# Patient Record
Sex: Male | Born: 1940 | Race: White | Hispanic: No | Marital: Married | Smoking: Never smoker
Health system: Southern US, Community
[De-identification: ages and names within clinical notes are randomized; demographics above are authoritative.]

## PROBLEM LIST (undated history)

## (undated) DIAGNOSIS — C801 Malignant (primary) neoplasm, unspecified: Secondary | ICD-10-CM

## (undated) DIAGNOSIS — I10 Essential (primary) hypertension: Secondary | ICD-10-CM

## (undated) DIAGNOSIS — E119 Type 2 diabetes mellitus without complications: Secondary | ICD-10-CM

## (undated) HISTORY — PX: PROSTATE SURGERY: SHX751

## (undated) HISTORY — PX: ROTATOR CUFF REPAIR: SHX139

## (undated) HISTORY — PX: CERVICAL SPINE SURGERY: SHX589

---

## 1997-09-30 ENCOUNTER — Ambulatory Visit (HOSPITAL_COMMUNITY): Admission: RE | Admit: 1997-09-30 | Discharge: 1997-09-30 | Payer: Self-pay | Admitting: Gastroenterology

## 2001-12-12 ENCOUNTER — Ambulatory Visit (HOSPITAL_BASED_OUTPATIENT_CLINIC_OR_DEPARTMENT_OTHER): Admission: RE | Admit: 2001-12-12 | Discharge: 2001-12-12 | Payer: Self-pay | Admitting: Orthopedic Surgery

## 2001-12-12 ENCOUNTER — Encounter (INDEPENDENT_AMBULATORY_CARE_PROVIDER_SITE_OTHER): Payer: Self-pay | Admitting: *Deleted

## 2005-11-09 ENCOUNTER — Ambulatory Visit: Payer: Self-pay | Admitting: Endocrinology

## 2005-12-06 ENCOUNTER — Ambulatory Visit: Payer: Self-pay | Admitting: Endocrinology

## 2006-02-02 ENCOUNTER — Ambulatory Visit: Payer: Self-pay | Admitting: Endocrinology

## 2006-03-01 ENCOUNTER — Ambulatory Visit (HOSPITAL_COMMUNITY): Admission: RE | Admit: 2006-03-01 | Discharge: 2006-03-01 | Payer: Self-pay | Admitting: Family Medicine

## 2006-04-27 ENCOUNTER — Ambulatory Visit (HOSPITAL_COMMUNITY): Admission: RE | Admit: 2006-04-27 | Discharge: 2006-04-27 | Payer: Self-pay | Admitting: Family Medicine

## 2006-09-03 ENCOUNTER — Encounter: Payer: Self-pay | Admitting: Endocrinology

## 2006-09-03 DIAGNOSIS — E119 Type 2 diabetes mellitus without complications: Secondary | ICD-10-CM

## 2006-09-03 DIAGNOSIS — I10 Essential (primary) hypertension: Secondary | ICD-10-CM | POA: Insufficient documentation

## 2006-11-16 ENCOUNTER — Encounter (INDEPENDENT_AMBULATORY_CARE_PROVIDER_SITE_OTHER): Payer: Self-pay | Admitting: Urology

## 2006-11-16 ENCOUNTER — Inpatient Hospital Stay (HOSPITAL_COMMUNITY): Admission: RE | Admit: 2006-11-16 | Discharge: 2006-11-17 | Payer: Self-pay | Admitting: Urology

## 2007-06-19 ENCOUNTER — Ambulatory Visit (HOSPITAL_COMMUNITY): Admission: RE | Admit: 2007-06-19 | Discharge: 2007-06-19 | Payer: Self-pay | Admitting: Family Medicine

## 2007-10-30 ENCOUNTER — Inpatient Hospital Stay (HOSPITAL_COMMUNITY): Admission: RE | Admit: 2007-10-30 | Discharge: 2007-10-31 | Payer: Self-pay | Admitting: Neurosurgery

## 2007-11-23 ENCOUNTER — Encounter: Admission: RE | Admit: 2007-11-23 | Discharge: 2007-11-23 | Payer: Self-pay | Admitting: Neurosurgery

## 2008-05-22 ENCOUNTER — Ambulatory Visit (HOSPITAL_COMMUNITY): Admission: RE | Admit: 2008-05-22 | Discharge: 2008-05-22 | Payer: Self-pay | Admitting: Family Medicine

## 2008-12-17 ENCOUNTER — Encounter: Admission: RE | Admit: 2008-12-17 | Discharge: 2008-12-17 | Payer: Self-pay | Admitting: Neurosurgery

## 2009-03-24 ENCOUNTER — Ambulatory Visit (HOSPITAL_COMMUNITY): Admission: RE | Admit: 2009-03-24 | Discharge: 2009-03-24 | Payer: Self-pay | Admitting: Family Medicine

## 2009-12-02 ENCOUNTER — Ambulatory Visit (HOSPITAL_COMMUNITY): Admission: RE | Admit: 2009-12-02 | Discharge: 2009-12-03 | Payer: Self-pay | Admitting: Urology

## 2010-03-31 LAB — COMPREHENSIVE METABOLIC PANEL
AST: 35 U/L (ref 0–37)
Alkaline Phosphatase: 70 U/L (ref 39–117)
BUN: 15 mg/dL (ref 6–23)
CO2: 30 mEq/L (ref 19–32)
Chloride: 103 mEq/L (ref 96–112)
Creatinine, Ser: 0.94 mg/dL (ref 0.4–1.5)
GFR calc Af Amer: >60 mL/min (ref >60)
GFR calc non Af Amer: >60 mL/min (ref >60)
Potassium: 4.9 mEq/L (ref 3.5–5.1)
Sodium: 141 mEq/L (ref 135–145)
Total Bilirubin: 0.6 mg/dL (ref 0.3–1.2)

## 2010-03-31 LAB — BASIC METABOLIC PANEL
BUN: 9 mg/dL (ref 6–23)
CO2: 31 mEq/L (ref 19–32)
Chloride: 104 mEq/L (ref 96–112)
GFR calc Af Amer: >60 mL/min (ref >60)
GFR calc non Af Amer: >60 mL/min (ref >60)
Glucose, Bld: 136 mg/dL — ABNORMAL HIGH (ref 70–99)

## 2010-03-31 LAB — GLUCOSE, CAPILLARY
Glucose-Capillary: 165 mg/dL — ABNORMAL HIGH (ref 70–99)
Glucose-Capillary: 211 mg/dL — ABNORMAL HIGH (ref 70–99)
Glucose-Capillary: 229 mg/dL — ABNORMAL HIGH (ref 70–99)

## 2010-03-31 LAB — CBC
MCHC: 34.9 g/dL (ref 30.0–36.0)
MCV: 94.2 fL (ref 78.0–100.0)
RBC: 4.1 MIL/uL — ABNORMAL LOW (ref 4.22–5.81)
RDW: 12.7 % (ref 11.5–15.5)

## 2010-03-31 LAB — TYPE AND SCREEN: ABO/RH(D): A POS

## 2010-03-31 LAB — HEMOGLOBIN AND HEMATOCRIT, BLOOD: Hemoglobin: 11.9 g/dL — ABNORMAL LOW (ref 13.0–17.0)

## 2010-03-31 LAB — APTT: aPTT: 30 seconds (ref 24–37)

## 2010-03-31 LAB — PROTIME-INR: INR: 1 (ref 0.00–1.49)

## 2010-04-28 ENCOUNTER — Other Ambulatory Visit (HOSPITAL_COMMUNITY): Payer: Self-pay | Admitting: Pulmonary Disease

## 2010-04-28 DIAGNOSIS — R0602 Shortness of breath: Secondary | ICD-10-CM

## 2010-04-30 ENCOUNTER — Other Ambulatory Visit (HOSPITAL_COMMUNITY): Payer: Self-pay

## 2010-04-30 ENCOUNTER — Ambulatory Visit (HOSPITAL_COMMUNITY)
Admission: RE | Admit: 2010-04-30 | Discharge: 2010-04-30 | Disposition: A | Discharge status: Home / Self Care | Payer: Medicare Other | Source: Ambulatory Visit | Attending: Pulmonary Disease | Admitting: Pulmonary Disease

## 2010-04-30 ENCOUNTER — Encounter (HOSPITAL_COMMUNITY): Payer: Self-pay

## 2010-04-30 DIAGNOSIS — E119 Type 2 diabetes mellitus without complications: Secondary | ICD-10-CM | POA: Insufficient documentation

## 2010-04-30 DIAGNOSIS — R0602 Shortness of breath: Secondary | ICD-10-CM

## 2010-04-30 DIAGNOSIS — Z8546 Personal history of malignant neoplasm of prostate: Secondary | ICD-10-CM | POA: Insufficient documentation

## 2010-04-30 DIAGNOSIS — J61 Pneumoconiosis due to asbestos and other mineral fibers: Secondary | ICD-10-CM | POA: Insufficient documentation

## 2010-04-30 HISTORY — DX: Malignant (primary) neoplasm, unspecified: C80.1

## 2010-04-30 MED ORDER — IOHEXOL 300 MG/ML  SOLN
100.0000 mL | Freq: Once | INTRAMUSCULAR | Status: AC | PRN
  Administered 2010-04-30: 100 mL via INTRAVENOUS

## 2010-05-05 ENCOUNTER — Ambulatory Visit (HOSPITAL_COMMUNITY)
Admission: RE | Admit: 2010-05-05 | Discharge: 2010-05-05 | Disposition: A | Discharge status: Home / Self Care | Payer: Medicare Other | Source: Ambulatory Visit | Attending: Pulmonary Disease | Admitting: Pulmonary Disease

## 2010-05-05 DIAGNOSIS — R0609 Other forms of dyspnea: Secondary | ICD-10-CM | POA: Insufficient documentation

## 2010-05-05 DIAGNOSIS — R0989 Other specified symptoms and signs involving the circulatory and respiratory systems: Secondary | ICD-10-CM | POA: Insufficient documentation

## 2010-05-05 LAB — BLOOD GAS, ARTERIAL
Bicarbonate: 24.2 mEq/L — ABNORMAL HIGH (ref 20.0–24.0)
Patient temperature: 37
pH, Arterial: 7.453 — ABNORMAL HIGH (ref 7.350–7.450)

## 2010-06-02 NOTE — H&P (Signed)
NAME:  Tommy Graham, Tommy Graham                ACCOUNT NO.:  [id number]   MEDICAL RECORD NO.:  [id number]          PATIENT TYPE:  INP   LOCATION:  X011                         FACILITY:  [facility]   PHYSICIAN:  Lucrezia Starch. Earlene Plater, M.D.  DATE OF BIRTH:  [birth year]   DATE OF ADMISSION:  11/16/2006  DATE OF DISCHARGE:                              HISTORY & PHYSICAL   CHIEF COMPLAINT:  I have prostate cancer.   HISTORY OF PRESENT ILLNESS:  Tommy Graham is a very nice 70 year old white  male who presented with an elevating PSA last value was 3.43.  On  examination was found to have soft asymmetry of his prostate.  Subsequently, he underwent ultrasound and biopsy of the prostate which  revealed a Gleason score 6 which 3+, 3 adenocarcinoma bilaterally and  fairly low volume.  He has considered all options, after understanding  risks, benefits and alternatives he has elected to proceed with robotic  radical prostatectomy.  He has been cleared for surgery by Patrica Duel, M.D. in [place], [place] and Richard A. Alanda Amass,  M.D of [facility].   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   He has some heart disease.  He has had high blood pressure, diabetes,  glaucoma.   MEDICATIONS:  Include metformin, glipizide, felodipine,lisinopril and  Xalatan, Ativan, fluoxetine and loratadine.   SURGERIES:  He had rotator cuff surgery and nose surgery.   SOCIAL HISTORY:  He quit smoking 18 years ago, but he did smoke 36 years  and he smoked up to four packs per day.  Drinker, no alcohol.  He has up  to four cups of coffee and three diet drinks per day.   FAMILY HISTORY:  Not significant.   REVIEW OF SYSTEMS:  He has had some weight loss, skin rash, sore throat.  Occasional shortness of breath, excessive thirst and some joint pain,  slight diarrhea and some constipation and headaches, depression and  anxiety,  but otherwise without problems.   PHYSICAL EXAMINATION:  He is afebrile.  Vital signs stable.  GENERAL:  He is well-nourished, well-groomed, oriented x3.  HEAD, EARS, NOSE, THROAT:  Normal.  NECK:  Without masses or thyromegaly.  CHEST:  Normal diaphragmatic motion.  HEART: Normal S1 and S2 without murmurs or gallops.  ABDOMEN:  Soft, nontender without masses, organomegaly.  EXTREMITIES:  Normal.  NEURO:  Intact.  SKIN:  Normal.  GU:  Penis, meatus, scrotum, testicle, adnexa, anus, perineum normal.  RECTAL:  Prostate is 25-30 grams with some soft asymmetry, but no frank  nodules.   IMPRESSION:  Clinical stage TIIB, although questionable palpable  abnormalities prostate cancer.   PLAN:  Robotic radical prostatectomy.      [name], M.D.  Electronically Signed     RLD/MEDQ  D:  11/16/2006  T:  11/17/2006  Job:  [id number]

## 2010-06-02 NOTE — Op Note (Signed)
NAME:  Tommy Graham, Tommy Graham NO.:  [id number]   MEDICAL RECORD NO.:  [id number]          PATIENT TYPE:  OIB   LOCATION:  3538                         FACILITY:  [facility]   PHYSICIAN:  Hewitt Shorts, M.D.DATE OF BIRTH:  11-22-1940   DATE OF PROCEDURE:  10/30/2007  DATE OF DISCHARGE:                               OPERATIVE REPORT   PREOPERATIVE DIAGNOSES:  1. Carpal tunnel syndrome.  2. Cervical disk herniation.  3. Cervical spondylosis.  4. Cervical degenerative disk disease.   POSTOPERATIVE DIAGNOSES:  1. Carpal tunnel syndrome.  2. Cervical disk herniation.  3. Cervical spondylosis.  4. Cervical degenerative disk disease.   PROCEDURES:  1. Left carpal tunnel release.  2. C5-6 and C6-7 anterior cervical decompression and arthrodesis with      a allograft and Tether cervical plating.   SURGEON:  Hewitt Shorts, MD   ASSISTANTS:  1. Russell L. Webb Silversmith, RN  2. Eliane Decree, MD   ANESTHESIA:  General endotracheal.   INDICATIONS:  The patient is a 70 year old man who has had neck and  bilateral upper extremity radicular pain, left worse than right.  He  also has bilateral carpal tunnel syndrome.  MRI of the cervical spine  shows advanced spondylosis and degenerative disk disease at the C5-6 and  C6-7 levels with significant disk herniations, C5-6 larger than C6-7,  both worse to the left side.  Decision was made to proceed with  decompression and arthrodesis.  We will plan on doing the left carpal  tunnel release as well, and plan on returning in a couple of months for  the right carpal tunnel release.   PROCEDURE:  The patient was brought to the operating room, placed under  general endotracheal anesthesia.  We first placed the tourniquet on the  left upper extremity, and then the left upper extremity was prepped with  Betadine soap and solution, and draped in a sterile fashion.  The left  carpal tunnel release was begun with incision in the proximal  palm, just  medial to the left thenar crease.  Dissection was carried down through  the subcutaneous tissue to the transverse carpal ligament.  This was  carefully divided in a distal-to-proximal fashion with care taken to  leave the underlying median nerve undisturbed.  The ligament was opened  in its full extent from distal-to-proximal and good decompression of the  carpal tunnel was achieved.  We then proceeded with closure.  The  subcutaneous tissue was approximated with interrupted and inverted 3-0  undyed Vicryl sutures and the skin edges were approximated with  interrupted horizontal mattress sutures with a 4-0 Nylon suture.  The  wound was dressed with Adaptic and gauze fluffs, and wrapped with a  Kling, and the tourniquet was released.   We then proceeded with the anterior cervical decompression and  arthrodesis.   The patient was placed in 10 pounds of halter traction.  The neck was  prepped with Betadine soap and solution, and draped in sterile fashion.  An horizontal incision was made on the left side of the neck.  The line  of  the incision was infiltrated with local anesthetic with epinephrine.  Dissection was carried down through the subcutaneous tissue.  Bipolar  electrocautery was used to maintain hemostasis.  Dissection was carried  down through an avascular plane leaving a sternocleidomastoid, carotid  artery, and jugular vein laterally; and trachea and esophagus medially.  The ventral aspect of the vertebral column was identified and a  localizing x-ray taken.  The C5-6 and C6-7 intravertebral disk space  were identified.  Discectomy was begun with incision of the annulus.  There were significant ventral osteophytic overgrowth that was removed  using osteotomes, the osteophyte removal tool, Kerrison punches, and the  X-max drill.  The disk space was entered at each level.  The disk was  found to be significantly degenerative at each level, and we proceeded  with a  discectomy using microcurette and pituitary rongeurs.  We then  began to remove the cartilaginous end-plate surface of the vertebral  bodies using the microcurettes along with the X-max drill.  The  microscope was draped and brought in to the field to provide additional  magnification, illumination, and visualization; and the remainder of the  decompression was performed using microdissection and microsurgical  technique.  Using the X-max drill, we removed the posterior osteophytic  overgrowth along with subsequently, a 2 mm Kerrison punch with a thin  footplate.  The posterior longitudinal ligament was carefully removed.  At the C5-6 level, we found a large disk herniation and several large  fragments of degenerate disk material were removed and good  decompression of the thecal sac was achieved.   We then continued the removal of spondylytic overgrowth posteriorly at  that level and examined the neural foramina to ensure the good  decompression of the exiting nerve roots was achieved bilaterally.  At  the C6-7 level, decompression was performed similarly.  Using the X-max  drill, we removed the posterior osteophytic overgrowth.  The posterior  longitudinal ligament was removed, and the spondylotic disk herniation  was removed, and again good decompression of the thecal sac and nerve  roots was achieved bilaterally.  Gelfoam was used to help establish  hemostasis along with the use of bipolar cautery.  Once the  decompression was completed and hemostasis was established, we measured  the height of the intervertebral disk space and selected two 7 mm  implants.  Each of these allograft implants was hydrated, and then  placed in the intervertebral disk space, and counter sunk.   We then discontinued the cervical traction and selected a Tether  cervical plate, it was positioned it over the fusion construction, and  secured to the vertebrae with 4 x 14 mm variable-angle screws.  Each  screw  hole was drilled, and the screw was placed in a alternating  fashion.  Final tightening was done of all five screws.  An x-ray showed  the grasp, plate and screws to be in good position, and the alignment to  be good.  The wound was irrigated with Bacitracin solution, checked for  hemostasis, was established and confirmed, and then we proceeded with  closure.  The platysma was closed with interrupted inverted 2-0 undyed  Vicryl sutures.  The subcutaneous and subcuticular were closed with  interrupted inverted 3-0 undyed Vicryl sutures, and the skin was  reapproximated with Dermabond.  The procedure was tolerated well.  Estimated blood loss was 100 mL.  Sponge and count were correct.  Following surgery, the patient was  placed in a soft cervical collar, reversed from  the anesthetic,  extubated, and transferred to the recovery room for further care where  he was noted to be moving all four extremities to command.      Hewitt Shorts, M.D.  Electronically Signed     RWN/MEDQ  D:  10/30/2007  T:  10/31/2007  Job:  [id number]   cc:   Hewitt Shorts, M.D.

## 2010-06-02 NOTE — Op Note (Signed)
NAME:  Tommy Graham, Tommy Graham                ACCOUNT NO.:  [id number]   MEDICAL RECORD NO.:  [id number]          PATIENT TYPE:  INP   LOCATION:  X011                         FACILITY:  [facility]   PHYSICIAN:  Lucrezia Starch. Earlene Plater, M.D.  DATE OF BIRTH:  04-30-1940   DATE OF PROCEDURE:  11/16/2006  DATE OF DISCHARGE:                               OPERATIVE REPORT   DIAGNOSIS:  Adenocarcinoma of prostate.   OPERATIVE PROCEDURE:  Robotic radical prostatectomy.   SURGEON:  Dr. Gaynelle Arabian.   ASSISTANT:  Heloise Purpura, MD   ANESTHESIA:  General endotracheal.   ESTIMATED BLOOD LOSS:  100 mL.   TUBES:  22-French Foley catheter and large round Blake drain.   COMPLICATIONS:  None.   INDICATIONS FOR PROCEDURE:  [name] is a very nice 70 year old white  male who was seen by Dr. Patrica Duel and found have palpable  abnormality his prostate. His PSA increased to the mid 3 range and he  was subsequently referred to see me.  It was actually 3.43.  He went  ultrasound and biopsy of the prostate which revealed bifocal bilateral  Gleason score 6 which was 3+3 adenocarcinoma.  He has considered all  options, considered risks, benefits and alternatives in detail and he  elected to proceed with robotic prostatectomy.  He has been  preoperatively cleared by Dr. Susa Griffins and Dr. Patrica Duel.   PROCEDURE IN DETAIL:  The patient is placed in supine position.  After  proper general endotracheal anesthesia he was placed in exaggerated  lithotomy position and prepped and draped with Betadine in a sterile  fashion.  A 22-French Foley catheter was inserted with 30 mL balloon and  the bladder was drained.  A periumbilical port was placed lateral to the  umbilicus through a small incision and the bladder was insufflated.  This was a 12 mm port.  The bladder was insufflated with CO2.  Right and  left robotic arm ports were then placed with 8 mm cannulas as was a  fourth arm port lateral to the left robotic  arm port.  Two working  ports, a 5 mm left arm and a 12 mm with right arm ports placed in the  right upper and mid quadrant of the abdomen.  Inspection revealed a few  adhesions but none affecting the procedure and no other abnormalities  noted.  The bladder was filled with approximately 200 mL of sterile  water and anterior space of Retzius was entered transperineally and was  dissected.  The endopelvic fascia was taken down bilaterally as was  partial resection of puboprostatic ligaments and dorsal vein complex was  clamped and cut with Endo GIA stapler.  The bladder neck prostatic  junction was then incised anteriorly and taken down to the bladder neck.  The bladder neck was taken off posteriorly.  The ampulla and vas  deferens were transected and then the resection of the seminal vesicles  were performed, maintaining good hemostasis.  The Denonvilliers fascia  was then entered.  A bilateral nerve spare was performed and the  neurovascular bundles were taken down posterolaterally  and the pedicles  were taken in serial packets with a large Hem-o-lok clips and the  dissection was carried down to the apex of the prostate on Denonvilliers  fascia.  Good bundles were noted to be present.  Anterior urethra was  then excised down to the Foley catheter.  This Foley catheter was  removed.  The posterior urethral plate was taken down.  The apex of  prostate was carefully dissected from the neurovascular bundle and the  prostate was removed.  No significant abnormalities were noted in the  lymph node chains.  We felt that probably was not necessary to do a  lymphadenectomy.  The bladder neck was then approximated to the urethra  after an amp of indigo carmine had been given IV.  A folding stitch was  placed with a 2-0 Vicryl suture in the 6 o'clock position to the  periurethral area and the anastomosis was accomplished with 3-0 Monocryl  suture both dyed and undyed tied together and the knot was  placed  posterior to the bladder at the 12 o'clock position.  They were run and  then tied anteriorly.  A 22 French coude catheter was easily passed into  the bladder.  The bladder was noted to irrigate clear.  Good hemostasis  was noted to be present.  It might be noted that saline had then placed  in the pelvis and rectum insufflated.  There were no leaks noted.  A  large round Blake drain was placed through the fourth arm port, placed  in the pelvis.  The specimen was grasped with Endocatch bag through the  periumbilical camera port.  The right 12-mm port was closed under direct  vision with suture passer and a 2-0 Vicryl suture and all devices were  removed along with the specimen and the periumbilical port was closed  under direct vision with 2-0 Vicryl fascial stitch.  The wounds were all  injected with 0.25% Marcaine.  It might be noted that each cannula had  been visualized in its removal and there was no bleeding present.  The  skin was then closed with skin staples.  Wound was dressed sterilely and  the patient was taken to the recovery room stable.      [name], M.D.  Electronically Signed     RLD/MEDQ  D:  11/16/2006  T:  11/17/2006  Job:  [id number]

## 2010-06-05 NOTE — Op Note (Signed)
NAME:  Tommy Graham, Tommy Graham                          ACCOUNT NO.:  [id number]   MEDICAL RECORD NO.:  [id number]                   PATIENT TYPE:  AMB   LOCATION:  DSC                                  FACILITY:  [facility]   PHYSICIAN:  Katy Fitch. Naaman Plummer., M.D.          DATE OF BIRTH:  02/18/1940   DATE OF PROCEDURE:  12/12/2001  DATE OF DISCHARGE:                                 OPERATIVE REPORT   PREOPERATIVE DIAGNOSES:  1. 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  2. Chronic mass, dorsal radial aspect of right long finger, status post     aggressive excision of mucous cyst by dermatologist.  3. 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   POSTOPERATIVE DIAGNOSES:  1. Chronic stenosing tenosynovitis, right thumb.  2. Scar and capsular granulation tissue, right long finger.  3. Giant cell tumor, right ring finger.   OPERATION PERFORMED:  1. Release of right thumb A-1 pulley, 26055-F5.  2. 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  3. Resection of giant cell tumor, right ring finger distal interphalangeal     joint region and radial aspect of middle phalanx.   SURGEON:  Katy Fitch. Sypher, M.D.   ASSISTANT:  Jonni Sanger, P.A.   ANESTHESIA:  General by mask.   SUPERVISING ANESTHESIOLOGIST:  Janetta Hora. Gelene Mink, M.D.   INDICATIONS FOR PROCEDURE:  The patient is a 71 year old gentleman referred  for evaluation and management of chronic wounds following aggressive  treatment of mucous cysts of the long and ring fingers back in 2000.  He had  very slow healing of his wounds by secondary intention.  At that time we did  not recommend  proceeding with skin grafts or other treatment as his wounds  appeared to be chronically colonized with bacteria.  He eventually healed  his wounds but developed recurrent mass lesions on the dorsal radial aspect  of the long and ring fingers.  X-rays in 2000 demonstrated a loose body  adjacent to the DIP joint of the long finger; however, films in 2003  revealed that the loose body had either resorbed or sloughed.   We recommended repeat exploration of his wounds at this time to determine  the nature of the masses that had recurred.  In addition, he had chronic  stenosing tenosynovitis of his right thumb.  This is in part due to chronic  type 2 diabetes.  After failure to respond to nonoperative measures, he is  brought to the operating room at this time.   DESCRIPTION OF PROCEDURE:  The patient was brought to the operating  room and  placed in supine position upon the operating table.  Following induction of  general anesthesia, the right arm was prepped with Betadine soap and  solution and sterilely draped.  Following exsanguination of the limb with an  Esmarch bandage, an arterial tourniquet on the proximal brachium was  inflated to 220 mmHg.  The procedure commenced with a short transverse  incision directly over the enlarged A-1 pulley.  Subcutaneous tissues were  carefully divided revealing significant fibrosis of the skin ligaments.  The  A-1 pulley was isolated and found to be thickened to a wall thickness of  approximately 2 mm.  This was split with a scalpel and scissors.  The flexor  pollicis longus was recovered and found to be in good condition.  Thereafter  free range of motion of the IP joint was recovered.  This wound was repaired  with mattress sutures of 5-0 nylon.   Attention was then directed to the dorsal radial aspect of the ring finger.  A curvilinear incision was fashioned directly over the mass.  Subcutaneous  tissues were carefully divided revealing a well  encapsulated orangish-gray  tumor consistent with a giant cell tumor.  This was circumferentially  dissected and a portion sent for biopsy.   A thorough cleaning of the entire radial capsule of the DIP joint was  accomplished, sparing the extensor tendon and the radial collateral  ligament.  The DIP joint was entered and a curet was used to clean the deep  surface of the distal phalanx.  No significant osteophytes or other lesions  were noted within the joint.  The ring finger wound was then repaired with  mattress sutures of 5-0 nylon.   Attention was then directed to the long finger where a portion of the  previous scar was resected and a Mercedes type incision created.  A  hypertrophic scar was noted with considerable granulation tissue along the  dorsal radial aspect of the joint.  There were no signs of giant cell tumor  from ascending or recurrent mucous cyst.  The hypertrophic granulation  tissue was resected down to the level of the extensor tendon and radial  collateral ligament.  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   This wound was irrigated.  Bleeding points were electrocauterized with  bipolar current followed by repair of the skin with interrupted sutures of 5-  0 nylon.  There were no apparent complications.  The patient tolerated the  procedure well.  The fingers were anesthetized with 0.25% Marcaine for  postoperative analgesia followed by dressing with Xeroflo, sterile gauze and  Coban.   NOTE:  He will return to our office in follow-up in approximately one week  for dressing change.                                                Katy Fitch Naaman Plummer., M.D.    RVS/MEDQ  D:  12/12/2001  T:  12/12/2001  Job:  [id number]

## 2010-10-19 LAB — CBC
HCT: 38 — ABNORMAL LOW
Hemoglobin: 13.1
MCHC: 34.3
MCV: 93.4
Platelets: 327
RBC: 4.07 — ABNORMAL LOW
RDW: 12.8
WBC: 6.3

## 2010-10-19 LAB — BASIC METABOLIC PANEL
BUN: 14
CO2: 26
Calcium: 9.5
Chloride: 106
Creatinine, Ser: 0.74
GFR calc Af Amer: >60
GFR calc non Af Amer: >60
Glucose, Bld: 102 — ABNORMAL HIGH
Potassium: 4.5
Sodium: 139

## 2010-10-19 LAB — GLUCOSE, CAPILLARY
Glucose-Capillary: 119 — ABNORMAL HIGH
Glucose-Capillary: 149 — ABNORMAL HIGH
Glucose-Capillary: 213 — ABNORMAL HIGH
Glucose-Capillary: 268 — ABNORMAL HIGH
Glucose-Capillary: 298 — ABNORMAL HIGH
Glucose-Capillary: 90

## 2010-10-28 LAB — DIFFERENTIAL
Eosinophils Relative: 0
Lymphocytes Relative: 7 — ABNORMAL LOW
Lymphs Abs: 0.8
Monocytes Absolute: 0.6

## 2010-10-28 LAB — URINALYSIS, ROUTINE W REFLEX MICROSCOPIC
Glucose, UA: NEGATIVE
Ketones, ur: NEGATIVE
Nitrite: NEGATIVE
Protein, ur: NEGATIVE
Urobilinogen, UA: 0.2

## 2010-10-28 LAB — CBC
HCT: 32.8 — ABNORMAL LOW
HCT: 41
Hemoglobin: 11.5 — ABNORMAL LOW
Platelets: 352
RDW: 12.1
WBC: 10.9 — ABNORMAL HIGH
WBC: 7.4

## 2010-10-28 LAB — COMPREHENSIVE METABOLIC PANEL
AST: 23
Albumin: 3.6
Alkaline Phosphatase: 77
BUN: 10
GFR calc Af Amer: >60
Potassium: 4.7
Total Protein: 7.2

## 2010-10-28 LAB — BASIC METABOLIC PANEL
Chloride: 102
GFR calc non Af Amer: >60
Glucose, Bld: 242 — ABNORMAL HIGH
Potassium: 3.8
Sodium: 133 — ABNORMAL LOW

## 2010-10-28 LAB — PROTIME-INR: INR: 0.9

## 2010-10-28 LAB — APTT: aPTT: 29

## 2014-12-26 ENCOUNTER — Other Ambulatory Visit (HOSPITAL_COMMUNITY): Payer: Self-pay | Admitting: Physician Assistant

## 2014-12-26 ENCOUNTER — Ambulatory Visit (HOSPITAL_COMMUNITY)
Admission: RE | Admit: 2014-12-26 | Discharge: 2014-12-26 | Disposition: A | Discharge status: Home / Self Care | Payer: Medicare Other | Source: Ambulatory Visit | Attending: Physician Assistant | Admitting: Physician Assistant

## 2014-12-26 DIAGNOSIS — J61 Pneumoconiosis due to asbestos and other mineral fibers: Secondary | ICD-10-CM | POA: Diagnosis not present

## 2014-12-26 DIAGNOSIS — T578X1A Toxic effect of other specified inorganic substances, accidental (unintentional), initial encounter: Secondary | ICD-10-CM

## 2014-12-26 DIAGNOSIS — R0602 Shortness of breath: Secondary | ICD-10-CM | POA: Insufficient documentation

## 2014-12-26 DIAGNOSIS — M5134 Other intervertebral disc degeneration, thoracic region: Secondary | ICD-10-CM | POA: Diagnosis not present

## 2014-12-26 DIAGNOSIS — R05 Cough: Secondary | ICD-10-CM | POA: Diagnosis not present

## 2015-01-30 ENCOUNTER — Encounter (INDEPENDENT_AMBULATORY_CARE_PROVIDER_SITE_OTHER): Payer: Self-pay | Admitting: *Deleted

## 2015-04-09 DIAGNOSIS — L821 Other seborrheic keratosis: Secondary | ICD-10-CM | POA: Diagnosis not present

## 2015-04-09 DIAGNOSIS — D239 Other benign neoplasm of skin, unspecified: Secondary | ICD-10-CM | POA: Diagnosis not present

## 2015-04-09 DIAGNOSIS — D692 Other nonthrombocytopenic purpura: Secondary | ICD-10-CM | POA: Diagnosis not present

## 2015-04-21 DIAGNOSIS — Z8546 Personal history of malignant neoplasm of prostate: Secondary | ICD-10-CM | POA: Diagnosis not present

## 2015-04-21 DIAGNOSIS — C61 Malignant neoplasm of prostate: Secondary | ICD-10-CM | POA: Diagnosis not present

## 2015-04-22 DIAGNOSIS — H401112 Primary open-angle glaucoma, right eye, moderate stage: Secondary | ICD-10-CM | POA: Diagnosis not present

## 2015-04-22 DIAGNOSIS — H401122 Primary open-angle glaucoma, left eye, moderate stage: Secondary | ICD-10-CM | POA: Diagnosis not present

## 2015-08-28 DIAGNOSIS — H401122 Primary open-angle glaucoma, left eye, moderate stage: Secondary | ICD-10-CM | POA: Diagnosis not present

## 2015-08-28 DIAGNOSIS — H401111 Primary open-angle glaucoma, right eye, mild stage: Secondary | ICD-10-CM | POA: Diagnosis not present

## 2015-10-02 DIAGNOSIS — M79644 Pain in right finger(s): Secondary | ICD-10-CM | POA: Diagnosis not present

## 2015-10-02 DIAGNOSIS — M25641 Stiffness of right hand, not elsewhere classified: Secondary | ICD-10-CM | POA: Diagnosis not present

## 2015-10-02 DIAGNOSIS — M65331 Trigger finger, right middle finger: Secondary | ICD-10-CM | POA: Diagnosis not present

## 2015-10-02 DIAGNOSIS — M7989 Other specified soft tissue disorders: Secondary | ICD-10-CM | POA: Diagnosis not present

## 2015-10-14 ENCOUNTER — Ambulatory Visit (HOSPITAL_COMMUNITY): Payer: Medicare Other | Admitting: Occupational Therapy

## 2015-10-15 ENCOUNTER — Ambulatory Visit (HOSPITAL_COMMUNITY): Payer: PPO | Attending: Sports Medicine

## 2015-10-15 ENCOUNTER — Encounter (HOSPITAL_COMMUNITY): Payer: Self-pay

## 2015-10-15 DIAGNOSIS — M79644 Pain in right finger(s): Secondary | ICD-10-CM | POA: Insufficient documentation

## 2015-10-15 NOTE — Patient Instructions (Signed)
*  Avoid activities that require repetitive gripping, repeated grasping or the prolonged use of vibrating hand-held machinery. *Use over the counter pain medication as needed for the pain. *Wear your splint that was given to you by your Doctor as much as you can; especially at night.     Self-Massage   Massaging the affected knuckle will facilitate blood flow to the area, which will lubricate the joint and prepare it for movement. With your affected finger in a comfortable position, begin to gently rub across the knuckle then rub in a circular motion. Continue the self-massage for two to three minutes and follow with range-of-motion exercises.   Stretching   Stretching your finger is important not only for your muscles but also for the tendons and ligaments in your affected finger. Keeping the connective tissue as flexible as possible is important in the treatment of trigger finger. Use your other hand to pull your affected finger beyond extension until you feel a stretch in the bottom of your finger. Hold the stretch for five to 10 seconds then stretch your finger in the other direction. Repeat each stretch three to five times.  Finger Extensor Stretch    This routine will make the affected finger moving after surgery. Lay flat the hand of your injured finger on a solid surface. Let your opposite hand hold the injured finger. Gently raise the injured finger up leaving the rest of your fingers flat on the surface. Continue the lift until the finger is slightly stretched. Let it stay for few seconds and allow to rest. Repeat when needed.   Finger Spread    You can start by pinching the fingertips and your thumb. Place an elastic band around them. Separate your fingers from your thumb, making the band fairly tight. Allow it to stay in place on your fingers and thumb. Make a repetitive pumping motion to extend the fingers and thumb away and close to each other again and again. You should apply  tension on the elastic during the whole exercise. Repeat the routine as necessary. Have your fingers and thumb bent towards your palm and hook the elastic band in the middle. Let your other hand pull the end of the band and allow slight tension. Straighten your fingers against the tension.

## 2015-10-15 NOTE — Therapy (Signed)
[place], [place], [address] Phone: [phone]   Fax:  [phone]  Occupational Therapy Evaluation  Patient Details  Name: Tommy Graham MRN: [id number] Date of Birth: 07-22-1940 Referring Provider: Melynda Keller, MD  Encounter Date: 10/15/2015      OT End of Session - 10/15/15 1155    Visit Number 1   Number of Visits 4   Date for OT Re-Evaluation 11/14/15   Authorization Type [employer] PPO $15 co pay   Authorization Time Period before 10th visit   Authorization - Visit Number 1   Authorization - Number of Visits 10   OT Start Time 0950   OT Stop Time 1030   OT Time Calculation (min) 40 min   Activity Tolerance Patient tolerated treatment well   Behavior During Therapy [facility] for tasks assessed/performed      Past Medical History:  Diagnosis Date  . Cancer ([place])     No past surgical history on file.  There were no vitals filed for this visit.      Subjective Assessment - 10/15/15 1047    Subjective  S: It has gotten better from what it was before the cortisone shot.    Pertinent History Patient is a 75 y/o male S/P right middle trigger finger and osteoarthritis. Pt reports that he has a history of trigger finger and his right finger began to get bad more than a year ago although approximately 1 month ago it became worse. Initially, patient reports that he had a trigger finger in his right middle PIP joint. Now he is able to straighten his middle finger completely with reports of increased pain and tightness in his 3rd digit.    Special Tests FOTO score: 44/100 (56% impaired)   Patient Stated Goals To decrease pain and be able to do daily tasks with right hand more comfortably.   Currently in Pain? Yes   Pain Score 4    Pain Location Finger (Comment which one)  3rd digit   Pain Orientation Right   Pain Descriptors / Indicators Aching;Tightness;Tender   Pain Type Chronic pain   Pain  Radiating Towards PIP joint into palm   Pain Onset More than a month ago   Pain Frequency Constant   Aggravating Factors  Excessive gripping activities   Pain Relieving Factors Pain medication, cortisone shot   Effect of Pain on Daily Activities Max effect   Multiple Pain Sites No           [facility] OT Assessment - 10/15/15 0958      Assessment   Diagnosis right middle trigger finger   Referring Provider Melynda Keller, MD   Onset Date --  starting to bother him approximately 2 month ago   Prior Therapy None     Precautions   Precautions Other (comment)   Precaution Comments Refrain from excessive gripping activities     Restrictions   Weight Bearing Restrictions No     Balance Screen   Has the patient fallen in the past 6 months --  Maybe. Unknown     Home  Environment   Family/patient expects to be discharged to: Private residence   Living Arrangements Spouse/significant other     Prior Function   Level of [place] Retired   Leisure Enjoy woodworking     ADL   ADL comments Difficulty driving, mowing lawn, doing woodworking activities.     Mobility   Mobility  Status Independent     Written Expression   Dominant Hand Right     Cognition   Overall Cognitive Status Within Functional Limits for tasks assessed     Edema   Edema Right hand: Mild edema noted in distal aspect of zone II of palm at 3rd digit.     ROM / Strength   AROM / PROM / Strength AROM;PROM;Strength     AROM   Overall AROM  Within functional limits for tasks performed   Overall AROM Comments Patient's overall right hand A/ROM is [facility]. patient is able to make a full fist as well as extend all digits of right hand. Pt does experience pain and discomfort when flexing digits.     PROM   Overall PROM  Within functional limits for tasks performed   Overall PROM Comments Right hand     Strength   Overall Strength Comments Strength is somewhat functional. patient  reports weakened hand strength which is baseline.                          OT Education - 10/15/15 1139    Education provided Yes   Education Details Educated patient on pain management, activities to avoid, recommendations of activty modification, finger stretches, self massage, and A/ROM exercises.   Person(s) Educated Patient   Methods Explanation;Demonstration;Verbal cues;Handout   Comprehension Verbalized understanding;Returned demonstration          OT Short Term Goals - 10/15/15 1226      OT SHORT TERM GOAL #1   Title Patient will be educated and independent with HEP to increase functional performance during daily and leisure tasks.    Time 4   Period Weeks   Status New     OT SHORT TERM GOAL #2   Title Patient will return to highest level of independence with all daily and leisure tasks.    Time 4   Period Weeks   Status New     OT SHORT TERM GOAL #3   Title Patient will report a decrease of pain level to 2/10 or less in order to increase comfort level during woodworking tasks.    Time 4   Period Weeks   Status New     OT SHORT TERM GOAL #4   Title Patient will decrease fascial restrictions to trace amount in right middle finger and palm to help decrease pain during functional tasks.    Time 4   Period Weeks   Status New                  Plan - 10/15/15 1156    Clinical Impression Statement A: Patient is a 75 y/o male S/P right middle trigger finger and OA causing increased pain and fascial restrictions resulting in difficulty and discomfort when completing daily and leisure tasks.    Rehab Potential Excellent   OT Frequency 1x / week   OT Duration 4 weeks   OT Treatment/Interventions Patient/family education;Self-care/ADL training;Therapeutic exercise;Manual Therapy;Therapeutic activities;Cryotherapy;Moist Heat;Passive range of motion;DME and/or AE instruction;Ultrasound;Splinting   Plan P: Patient will beneft from skilled OT  services to increase functional performance and ability to complete daily and leisure tasks using right hand. Treatment Plan: Follow up on pain, HEP, and recommended activity modification. Myofascial release, gentle manual stretching, extension exercises.   OT Home Exercise Plan 9/27: self massage, AE, A/ROM exercises, finger stretches, activity modification   Consulted and Agree with Plan of Care Patient  Patient will benefit from skilled therapeutic intervention in order to improve the following deficits and impairments:  Pain, Increased fascial restricitons, Impaired UE functional use  Visit Diagnosis: Pain in right finger(s) - Plan: Ot plan of care cert/re-cert      G-Codes - [id number]    Functional Assessment Tool Used FOTO score: 44/100 (56% impaired)   Functional Limitation Carrying, moving and handling objects   Carrying, Moving and Handling Objects Current Status [id number]) At least 40 percent but less than 60 percent impaired, limited or restricted   Carrying, Moving and Handling Objects Goal Status [id number]) At least 20 percent but less than 40 percent impaired, limited or restricted      Problem List Patient Active Problem List   Diagnosis Date Noted  . DIABETES MELLITUS, TYPE II 09/03/2006  . HYPERTENSION 09/03/2006  [name], OTR/L,CBIS  [phone]  10/15/2015, 12:39 PM  [place] [address] [place], [place], [address] Phone: [phone]   Fax:  [phone]  Name: Tommy Graham MRN: [id number] Date of Birth: 03-27-1940

## 2015-10-23 ENCOUNTER — Encounter (HOSPITAL_COMMUNITY): Payer: Self-pay | Admitting: Occupational Therapy

## 2015-10-23 ENCOUNTER — Ambulatory Visit (HOSPITAL_COMMUNITY): Payer: PPO | Attending: Sports Medicine | Admitting: Occupational Therapy

## 2015-10-23 DIAGNOSIS — M79644 Pain in right finger(s): Secondary | ICD-10-CM | POA: Diagnosis not present

## 2015-10-23 NOTE — Therapy (Signed)
[place], [place], [address] Phone: [phone]   Fax:  [phone]  Occupational Therapy Treatment  Patient Details  Name: Tommy Graham MRN: [id number] Date of Birth: [birth year] Referring Provider: Melynda Keller, MD  Encounter Date: 10/23/2015      OT End of Session - 10/23/15 1206    Visit Number 2   Number of Visits 4   Date for OT Re-Evaluation 11/14/15   Authorization Type [employer] PPO $15 co pay   Authorization Time Period before 10th visit   Authorization - Visit Number 2   Authorization - Number of Visits 10   OT Start Time 1116   OT Stop Time 1156   OT Time Calculation (min) 40 min   Activity Tolerance Patient tolerated treatment well   Behavior During Therapy [facility] for tasks assessed/performed      Past Medical History:  Diagnosis Date  . Cancer ([place])     No past surgical history on file.  There were no vitals filed for this visit.      Subjective Assessment - 10/23/15 1116    Subjective  S: I do those exercises at night and in the morning.    Currently in Pain? Yes   Pain Score 4    Pain Location Finger (Comment which one)  3rd digit   Pain Descriptors / Indicators Aching;Tightness   Pain Type Chronic pain   Pain Radiating Towards PIP joint into palm   Pain Onset More than a month ago   Pain Frequency Constant   Aggravating Factors  excessive gripping    Pain Relieving Factors pain mediation, cortisone shot, ice   Effect of Pain on Daily Activities limited completion of ADL tasks   Multiple Pain Sites No            [facility] OT Assessment - 10/23/15 1115      Assessment   Diagnosis right middle trigger finger     Precautions   Precautions Other (comment)   Precaution Comments Refrain from excessive gripping activities                  OT Treatments/Exercises (OP) - 10/23/15 1119      Exercises   Exercises Hand     Hand Exercises   Digit  Composite ABduction Strengthening;10 reps;Other (comment)  green rubberband   Digit Composite ADduction Strengthening;10 reps;Other (comment)  green rubberband   Theraputty - Flatten red-keeping digits extended   Digit Abduction/Adduction A/ROM 10X   Tendon Glides 10X   Other Hand Exercises towel crumple, working on fully extending digits for crumpling and straightening towel, 10X; finger extensor stretch, 5X holding for 10 seconds, 2 sets.    Other Hand Exercises Walking fingers up wall ladder in gym, walking with 3rd digit extended first, then following with 2nd and 4th digits; finger taps, 10X, 3rd digit only     Manual Therapy   Manual Therapy Myofascial release   Manual therapy comments Manual therapy completed separately from all exercises   Myofascial Release Myofascial release completed to right 3rd digit and palmar region to decrease fascial restrictions and pain, and increase joint range of motion.                   OT Short Term Goals - 10/23/15 1209      OT SHORT TERM GOAL #1   Title Patient will be educated and independent with HEP to increase functional performance during daily and  leisure tasks.    Time 4   Period Weeks   Status On-going     OT SHORT TERM GOAL #2   Title Patient will return to highest level of independence with all daily and leisure tasks.    Time 4   Period Weeks   Status On-going     OT SHORT TERM GOAL #3   Title Patient will report a decrease of pain level to 2/10 or less in order to increase comfort level during woodworking tasks.    Time 4   Period Weeks   Status On-going     OT SHORT TERM GOAL #4   Title Patient will decrease fascial restrictions to trace amount in right middle finger and palm to help decrease pain during functional tasks.    Time 4   Period Weeks   Status On-going                  Plan - 10/23/15 1207    Clinical Impression Statement A: Initiated myofascial release and exercises working on  extension and strengthening of 3rd digit. Pt reports discomfort at 3rd MCP joint during manual therapy. Pt required verbal and occasional visual cuing for correct form during exercises. HEP is going well.    Plan P: Gentle manual stretching, continue with extension exercises   OT Home Exercise Plan 9/27: self massage, AE, A/ROM exercises, finger stretches, activity modification      Patient will benefit from skilled therapeutic intervention in order to improve the following deficits and impairments:  Pain, Increased fascial restricitons, Impaired UE functional use  Visit Diagnosis: Pain in right finger(s)    Problem List Patient Active Problem List   Diagnosis Date Noted  . DIABETES MELLITUS, TYPE II 09/03/2006  . HYPERTENSION 09/03/2006   Tommy Graham, OTR/L  [phone] 10/23/2015, 12:10 PM  [place] [address] [place], [place], [address] Phone: [phone]   Fax:  [phone]  Name: Tommy Graham MRN: [id number] Date of Birth: 12/03/1940

## 2015-10-30 ENCOUNTER — Encounter (HOSPITAL_COMMUNITY): Payer: Self-pay

## 2015-10-30 ENCOUNTER — Ambulatory Visit (HOSPITAL_COMMUNITY): Payer: PPO

## 2015-10-30 ENCOUNTER — Telehealth (HOSPITAL_COMMUNITY): Payer: Self-pay

## 2015-10-30 DIAGNOSIS — M79644 Pain in right finger(s): Secondary | ICD-10-CM

## 2015-10-30 NOTE — Therapy (Signed)
[place], [place], [address] Phone: [phone]   Fax:  [phone]  Occupational Therapy Treatment  Patient Details  Name: Tommy Graham MRN: [id number] Date of Birth: 06-13-1940 Referring Provider: Melynda Keller, MD  Encounter Date: 10/30/2015      OT End of Session - 10/30/15 1022    Visit Number 3   Number of Visits 4   Date for OT Re-Evaluation 11/14/15   Authorization Type [employer] PPO $15 co pay   Authorization Time Period before 10th visit   Authorization - Visit Number 3   Authorization - Number of Visits 10   OT Start Time [phone]   OT Stop Time 0945   OT Time Calculation (min) 40 min   Activity Tolerance Patient tolerated treatment well   Behavior During Therapy [facility] for tasks assessed/performed      Past Medical History:  Diagnosis Date  . Cancer ([place])     No past surgical history on file.  There were no vitals filed for this visit.      Subjective Assessment - 10/30/15 0928    Subjective  S: My finger is definitely doing so much better.    Currently in Pain? Yes   Pain Score 3    Pain Location Finger (Comment which one)  3rd digit   Pain Orientation Right   Pain Descriptors / Indicators Tightness   Pain Type Chronic pain   Pain Radiating Towards PIP joint into palm   Pain Onset More than a month ago   Pain Frequency Intermittent   Aggravating Factors  excessive gripping   Pain Relieving Factors pain mediation, cortisone shot, ice   Effect of Pain on Daily Activities limited completion of ADL tasks   Multiple Pain Sites No            [facility] OT Assessment - 10/30/15 0934      Assessment   Diagnosis right middle trigger finger     Precautions   Precautions Other (comment)   Precaution Comments Refrain from excessive gripping activities                  OT Treatments/Exercises (OP) - 10/30/15 0935      Exercises   Exercises Hand     Hand  Exercises   Digit Abduction/Adduction with yellow putty; 10X   Other Hand Exercises Yellow putty; finger extension; 5X with middle and ring together.     Modalities   Modalities Paraffin;Moist Heat     Moist Heat Therapy   Number Minutes Moist Heat 10 Minutes   Moist Heat Location Hand     RUE Paraffin   Number Minutes Paraffin 10 Minutes   RUE Paraffin Location Hand                OT Education - 10/30/15 1021    Education provided Yes   Education Details Patient was given yellow theraputty to work on finger extension   Person(s) Educated Patient   Methods Explanation;Verbal cues   Comprehension Verbalized understanding          OT Short Term Goals - 10/23/15 1209      OT SHORT TERM GOAL #1   Title Patient will be educated and independent with HEP to increase functional performance during daily and leisure tasks.    Time 4   Period Weeks   Status On-going     OT SHORT TERM GOAL #2   Title Patient will return  to highest level of independence with all daily and leisure tasks.    Time 4   Period Weeks   Status On-going     OT SHORT TERM GOAL #3   Title Patient will report a decrease of pain level to 2/10 or less in order to increase comfort level during woodworking tasks.    Time 4   Period Weeks   Status On-going     OT SHORT TERM GOAL #4   Title Patient will decrease fascial restrictions to trace amount in right middle finger and palm to help decrease pain during functional tasks.    Time 4   Period Weeks   Status On-going                  Plan - 10/30/15 1022    Clinical Impression Statement A: Continued with myofascial release and manual stretching. Incorporated paraffin wax and moist heat for pain and to decrease tightness. Pt was given yellow theraputty to use at home with HEP. Continued to discuss technique and activity modifications related to woodworking to decrease the occurance of pain with arthritis.   Plan P: Discharge next session       Patient will benefit from skilled therapeutic intervention in order to improve the following deficits and impairments:  Pain, Increased fascial restricitons, Impaired UE functional use  Visit Diagnosis: Pain in right finger(s)    Problem List Patient Active Problem List   Diagnosis Date Noted  . DIABETES MELLITUS, TYPE II 09/03/2006  . HYPERTENSION 09/03/2006     [name], OTR/L,CBIS  [phone]  10/30/2015, 10:28 AM  [facility] [address] [place], [place], [address] Phone: [phone]   Fax:  [phone]  Name: Tommy Graham MRN: [id number] Date of Birth: [birth year]

## 2015-10-30 NOTE — Telephone Encounter (Signed)
Pt wanted to move from 9:45 to l:45 due to another MD apptment that morning

## 2015-11-06 ENCOUNTER — Ambulatory Visit (HOSPITAL_COMMUNITY): Payer: PPO

## 2015-11-06 ENCOUNTER — Encounter (HOSPITAL_COMMUNITY): Payer: Self-pay

## 2015-11-06 DIAGNOSIS — M79644 Pain in right finger(s): Secondary | ICD-10-CM

## 2015-11-06 DIAGNOSIS — M25641 Stiffness of right hand, not elsewhere classified: Secondary | ICD-10-CM | POA: Diagnosis not present

## 2015-11-06 DIAGNOSIS — M65331 Trigger finger, right middle finger: Secondary | ICD-10-CM | POA: Diagnosis not present

## 2015-11-06 DIAGNOSIS — M65841 Other synovitis and tenosynovitis, right hand: Secondary | ICD-10-CM | POA: Diagnosis not present

## 2015-11-06 NOTE — Therapy (Signed)
[place], [place], [address] Phone: [phone]   Fax:  [phone]  Occupational Therapy Treatment And reassessment  Patient Details  Name: Tommy Graham MRN: [id number] Date of Birth: [birth year] Referring Provider: Melynda Keller, MD  Encounter Date: 11/06/2015      OT End of Session - 11/06/15 1408    Visit Number 4   Number of Visits 4   Authorization Type [employer] PPO $15 co pay   Authorization Time Period before 10th visit   Authorization - Visit Number 4   Authorization - Number of Visits 10   OT Start Time 1350  reassessment   OT Stop Time 1425   OT Time Calculation (min) 35 min   Activity Tolerance Patient tolerated treatment well   Behavior During Therapy [facility] for tasks assessed/performed      Past Medical History:  Diagnosis Date  . Cancer ([place])     No past surgical history on file.  There were no vitals filed for this visit.      Subjective Assessment - 11/06/15 1510    Subjective  S: I'm very grateful for everything. I believe the arthritis in my hand will always cause me trouble.    Special Tests FOTO score: 76/100   Currently in Pain? Yes   Pain Score 2    Pain Location Finger (Comment which one)  3rd digit   Pain Orientation Right   Pain Descriptors / Indicators Tightness   Pain Type Chronic pain   Pain Radiating Towards PIP joint to palm   Pain Onset More than a month ago   Pain Frequency Intermittent   Aggravating Factors  excessive use and gripping   Pain Relieving Factors pain medication, ice   Effect of Pain on Daily Activities min-mod effect   Multiple Pain Sites No            [facility] OT Assessment - 11/06/15 1354      Assessment   Diagnosis right middle trigger finger     Precautions   Precautions Other (comment)   Precaution Comments Refrain from excessive gripping activities     Prior Function   Level of Independence Independent                   OT Treatments/Exercises (OP) - 11/06/15 1358      Exercises   Exercises Hand     Modalities   Modalities Paraffin;Moist Heat     Moist Heat Therapy   Number Minutes Moist Heat 10 Minutes   Moist Heat Location Hand     RUE Paraffin   Number Minutes Paraffin 10 Minutes   RUE Paraffin Location Hand                OT Education - 11/06/15 1407    Education provided Yes   Education Details Reviwed therapy goals. Recommended that patient continue with therapy HEP. Reviewed task modifications   Person(s) Educated Patient   Methods Explanation;Verbal cues   Comprehension Verbalized understanding          OT Short Term Goals - 11/06/15 1358      OT SHORT TERM GOAL #1   Title Patient will be educated and independent with HEP to increase functional performance during daily and leisure tasks.    Time 4   Period Weeks   Status Achieved     OT SHORT TERM GOAL #2   Title Patient will return to highest level of  independence with all daily and leisure tasks.    Time 4   Period Weeks   Status Achieved     OT SHORT TERM GOAL #3   Title Patient will report a decrease of pain level to 2/10 or less in order to increase comfort level during woodworking tasks.    Time 4   Period Weeks   Status Achieved     OT SHORT TERM GOAL #4   Title Patient will decrease fascial restrictions to trace amount in right middle finger and palm to help decrease pain during functional tasks.    Time 4   Period Weeks   Status Achieved                  Plan - 11/09/15 1515    Clinical Impression Statement A: Reassessment and discharge completed this date. Pt has met all therapy goals this session. Pt feels ready to discharge with HEP> All questions addressed.   Plan P: D/C with HEP.      Patient will benefit from skilled therapeutic intervention in order to improve the following deficits and impairments:  Pain, Increased fascial restricitons, Impaired UE  functional use  Visit Diagnosis: Pain in right finger(s)      G-Codes - 11-09-2015 1416    Functional Assessment Tool Used FOTO score: 76/100 (24% impaired)   Functional Limitation Carrying, moving and handling objects   Carrying, Moving and Handling Objects Goal Status ([id number]) At least 20 percent but less than 40 percent impaired, limited or restricted   Carrying, Moving and Handling Objects Discharge Status [phone]) At least 20 percent but less than 40 percent impaired, limited or restricted      Problem List Patient Active Problem List   Diagnosis Date Noted  . DIABETES MELLITUS, TYPE II 09/03/2006  . HYPERTENSION 09/03/2006    OCCUPATIONAL THERAPY DISCHARGE SUMMARY  Visits from Start of Care: 4  Current functional level related to goals / functional outcomes: See above   Remaining deficits: See above   Education / Equipment: See above Plan: Patient agrees to discharge.  Patient goals were met. Patient is being discharged due to meeting the stated rehab goals.  ?????           [name], OTR/L,CBIS  [phone]  11/09/15, 3:18 PM  [facility] [address] [place], [place], [address] Phone: [phone]   Fax:  [phone]  Name: Tommy Graham MRN: [id number] Date of Birth: 25-May-1940

## 2015-11-13 ENCOUNTER — Ambulatory Visit (HOSPITAL_COMMUNITY): Payer: PPO

## 2016-03-01 DIAGNOSIS — H26493 Other secondary cataract, bilateral: Secondary | ICD-10-CM | POA: Diagnosis not present

## 2016-03-17 DIAGNOSIS — H26492 Other secondary cataract, left eye: Secondary | ICD-10-CM | POA: Diagnosis not present

## 2016-04-19 DIAGNOSIS — R350 Frequency of micturition: Secondary | ICD-10-CM | POA: Diagnosis not present

## 2016-04-19 DIAGNOSIS — Z8546 Personal history of malignant neoplasm of prostate: Secondary | ICD-10-CM | POA: Diagnosis not present

## 2016-04-19 DIAGNOSIS — C61 Malignant neoplasm of prostate: Secondary | ICD-10-CM | POA: Diagnosis not present

## 2016-05-02 DIAGNOSIS — H6981 Other specified disorders of Eustachian tube, right ear: Secondary | ICD-10-CM | POA: Diagnosis not present

## 2016-05-02 DIAGNOSIS — J00 Acute nasopharyngitis [common cold]: Secondary | ICD-10-CM | POA: Diagnosis not present

## 2016-05-02 DIAGNOSIS — J449 Chronic obstructive pulmonary disease, unspecified: Secondary | ICD-10-CM | POA: Diagnosis not present

## 2016-05-17 DIAGNOSIS — H6692 Otitis media, unspecified, left ear: Secondary | ICD-10-CM | POA: Diagnosis not present

## 2016-05-17 DIAGNOSIS — E782 Mixed hyperlipidemia: Secondary | ICD-10-CM | POA: Diagnosis not present

## 2016-05-17 DIAGNOSIS — I10 Essential (primary) hypertension: Secondary | ICD-10-CM | POA: Diagnosis not present

## 2016-05-17 DIAGNOSIS — Z6823 Body mass index (BMI) 23.0-23.9, adult: Secondary | ICD-10-CM | POA: Diagnosis not present

## 2016-05-17 DIAGNOSIS — K219 Gastro-esophageal reflux disease without esophagitis: Secondary | ICD-10-CM | POA: Diagnosis not present

## 2016-05-17 DIAGNOSIS — E119 Type 2 diabetes mellitus without complications: Secondary | ICD-10-CM | POA: Diagnosis not present

## 2016-05-17 DIAGNOSIS — N529 Male erectile dysfunction, unspecified: Secondary | ICD-10-CM | POA: Diagnosis not present

## 2016-05-17 DIAGNOSIS — J069 Acute upper respiratory infection, unspecified: Secondary | ICD-10-CM | POA: Diagnosis not present

## 2016-05-17 DIAGNOSIS — Z1389 Encounter for screening for other disorder: Secondary | ICD-10-CM | POA: Diagnosis not present

## 2016-10-01 DIAGNOSIS — H401132 Primary open-angle glaucoma, bilateral, moderate stage: Secondary | ICD-10-CM | POA: Diagnosis not present

## 2016-11-16 DIAGNOSIS — Z6824 Body mass index (BMI) 24.0-24.9, adult: Secondary | ICD-10-CM | POA: Diagnosis not present

## 2016-11-16 DIAGNOSIS — Z0001 Encounter for general adult medical examination with abnormal findings: Secondary | ICD-10-CM | POA: Diagnosis not present

## 2016-11-16 DIAGNOSIS — E1165 Type 2 diabetes mellitus with hyperglycemia: Secondary | ICD-10-CM | POA: Diagnosis not present

## 2016-11-16 DIAGNOSIS — I10 Essential (primary) hypertension: Secondary | ICD-10-CM | POA: Diagnosis not present

## 2016-11-16 DIAGNOSIS — E782 Mixed hyperlipidemia: Secondary | ICD-10-CM | POA: Diagnosis not present

## 2016-11-16 DIAGNOSIS — F331 Major depressive disorder, recurrent, moderate: Secondary | ICD-10-CM | POA: Diagnosis not present

## 2016-11-23 DIAGNOSIS — Z1212 Encounter for screening for malignant neoplasm of rectum: Secondary | ICD-10-CM | POA: Diagnosis not present

## 2016-11-23 DIAGNOSIS — Z1211 Encounter for screening for malignant neoplasm of colon: Secondary | ICD-10-CM | POA: Diagnosis not present

## 2017-02-14 DIAGNOSIS — M542 Cervicalgia: Secondary | ICD-10-CM | POA: Diagnosis not present

## 2017-03-15 DIAGNOSIS — E1165 Type 2 diabetes mellitus with hyperglycemia: Secondary | ICD-10-CM | POA: Diagnosis not present

## 2017-03-15 DIAGNOSIS — F331 Major depressive disorder, recurrent, moderate: Secondary | ICD-10-CM | POA: Diagnosis not present

## 2017-03-15 DIAGNOSIS — D519 Vitamin B12 deficiency anemia, unspecified: Secondary | ICD-10-CM | POA: Diagnosis not present

## 2017-03-15 DIAGNOSIS — E782 Mixed hyperlipidemia: Secondary | ICD-10-CM | POA: Diagnosis not present

## 2017-03-15 DIAGNOSIS — I10 Essential (primary) hypertension: Secondary | ICD-10-CM | POA: Diagnosis not present

## 2017-03-17 DIAGNOSIS — K219 Gastro-esophageal reflux disease without esophagitis: Secondary | ICD-10-CM | POA: Diagnosis not present

## 2017-03-17 DIAGNOSIS — F331 Major depressive disorder, recurrent, moderate: Secondary | ICD-10-CM | POA: Diagnosis not present

## 2017-03-17 DIAGNOSIS — I10 Essential (primary) hypertension: Secondary | ICD-10-CM | POA: Diagnosis not present

## 2017-03-17 DIAGNOSIS — J301 Allergic rhinitis due to pollen: Secondary | ICD-10-CM | POA: Diagnosis not present

## 2017-03-17 DIAGNOSIS — Z6824 Body mass index (BMI) 24.0-24.9, adult: Secondary | ICD-10-CM | POA: Diagnosis not present

## 2017-03-17 DIAGNOSIS — E1165 Type 2 diabetes mellitus with hyperglycemia: Secondary | ICD-10-CM | POA: Diagnosis not present

## 2017-03-17 DIAGNOSIS — E782 Mixed hyperlipidemia: Secondary | ICD-10-CM | POA: Diagnosis not present

## 2017-03-28 DIAGNOSIS — H401131 Primary open-angle glaucoma, bilateral, mild stage: Secondary | ICD-10-CM | POA: Diagnosis not present

## 2017-05-02 DIAGNOSIS — C61 Malignant neoplasm of prostate: Secondary | ICD-10-CM | POA: Diagnosis not present

## 2017-05-02 DIAGNOSIS — Z8546 Personal history of malignant neoplasm of prostate: Secondary | ICD-10-CM | POA: Diagnosis not present

## 2017-05-02 DIAGNOSIS — N393 Stress incontinence (female) (male): Secondary | ICD-10-CM | POA: Diagnosis not present

## 2017-05-20 DIAGNOSIS — Z6825 Body mass index (BMI) 25.0-25.9, adult: Secondary | ICD-10-CM | POA: Diagnosis not present

## 2017-05-20 DIAGNOSIS — J0101 Acute recurrent maxillary sinusitis: Secondary | ICD-10-CM | POA: Diagnosis not present

## 2017-05-20 DIAGNOSIS — R05 Cough: Secondary | ICD-10-CM | POA: Diagnosis not present

## 2017-06-03 DIAGNOSIS — E1165 Type 2 diabetes mellitus with hyperglycemia: Secondary | ICD-10-CM | POA: Diagnosis not present

## 2017-06-03 DIAGNOSIS — Z8546 Personal history of malignant neoplasm of prostate: Secondary | ICD-10-CM | POA: Diagnosis not present

## 2017-06-03 DIAGNOSIS — N393 Stress incontinence (female) (male): Secondary | ICD-10-CM | POA: Diagnosis not present

## 2017-06-03 DIAGNOSIS — N5231 Erectile dysfunction following radical prostatectomy: Secondary | ICD-10-CM | POA: Diagnosis not present

## 2017-07-11 DIAGNOSIS — Z6825 Body mass index (BMI) 25.0-25.9, adult: Secondary | ICD-10-CM | POA: Diagnosis not present

## 2017-07-11 DIAGNOSIS — Z0001 Encounter for general adult medical examination with abnormal findings: Secondary | ICD-10-CM | POA: Diagnosis not present

## 2017-07-11 DIAGNOSIS — I10 Essential (primary) hypertension: Secondary | ICD-10-CM | POA: Diagnosis not present

## 2017-07-11 DIAGNOSIS — E782 Mixed hyperlipidemia: Secondary | ICD-10-CM | POA: Diagnosis not present

## 2017-07-11 DIAGNOSIS — F331 Major depressive disorder, recurrent, moderate: Secondary | ICD-10-CM | POA: Diagnosis not present

## 2017-07-11 DIAGNOSIS — K219 Gastro-esophageal reflux disease without esophagitis: Secondary | ICD-10-CM | POA: Diagnosis not present

## 2017-07-11 DIAGNOSIS — E1165 Type 2 diabetes mellitus with hyperglycemia: Secondary | ICD-10-CM | POA: Diagnosis not present

## 2017-07-13 DIAGNOSIS — K219 Gastro-esophageal reflux disease without esophagitis: Secondary | ICD-10-CM | POA: Diagnosis not present

## 2017-07-13 DIAGNOSIS — J301 Allergic rhinitis due to pollen: Secondary | ICD-10-CM | POA: Diagnosis not present

## 2017-07-13 DIAGNOSIS — F331 Major depressive disorder, recurrent, moderate: Secondary | ICD-10-CM | POA: Diagnosis not present

## 2017-07-13 DIAGNOSIS — E1165 Type 2 diabetes mellitus with hyperglycemia: Secondary | ICD-10-CM | POA: Diagnosis not present

## 2017-07-13 DIAGNOSIS — I10 Essential (primary) hypertension: Secondary | ICD-10-CM | POA: Diagnosis not present

## 2017-07-13 DIAGNOSIS — E782 Mixed hyperlipidemia: Secondary | ICD-10-CM | POA: Diagnosis not present

## 2017-07-13 DIAGNOSIS — J440 Chronic obstructive pulmonary disease with acute lower respiratory infection: Secondary | ICD-10-CM | POA: Diagnosis not present

## 2017-07-13 DIAGNOSIS — Z6824 Body mass index (BMI) 24.0-24.9, adult: Secondary | ICD-10-CM | POA: Diagnosis not present

## 2017-09-27 DIAGNOSIS — R51 Headache: Secondary | ICD-10-CM | POA: Diagnosis not present

## 2017-09-27 DIAGNOSIS — H524 Presbyopia: Secondary | ICD-10-CM | POA: Diagnosis not present

## 2017-09-27 DIAGNOSIS — H5211 Myopia, right eye: Secondary | ICD-10-CM | POA: Diagnosis not present

## 2017-09-27 DIAGNOSIS — H401131 Primary open-angle glaucoma, bilateral, mild stage: Secondary | ICD-10-CM | POA: Diagnosis not present

## 2017-12-29 DIAGNOSIS — I10 Essential (primary) hypertension: Secondary | ICD-10-CM | POA: Diagnosis not present

## 2017-12-29 DIAGNOSIS — F331 Major depressive disorder, recurrent, moderate: Secondary | ICD-10-CM | POA: Diagnosis not present

## 2017-12-29 DIAGNOSIS — Z7689 Persons encountering health services in other specified circumstances: Secondary | ICD-10-CM | POA: Diagnosis not present

## 2017-12-29 DIAGNOSIS — E782 Mixed hyperlipidemia: Secondary | ICD-10-CM | POA: Diagnosis not present

## 2017-12-29 DIAGNOSIS — E1165 Type 2 diabetes mellitus with hyperglycemia: Secondary | ICD-10-CM | POA: Diagnosis not present

## 2017-12-29 DIAGNOSIS — J440 Chronic obstructive pulmonary disease with acute lower respiratory infection: Secondary | ICD-10-CM | POA: Diagnosis not present

## 2017-12-29 DIAGNOSIS — K219 Gastro-esophageal reflux disease without esophagitis: Secondary | ICD-10-CM | POA: Diagnosis not present

## 2018-01-03 DIAGNOSIS — I10 Essential (primary) hypertension: Secondary | ICD-10-CM | POA: Diagnosis not present

## 2018-01-03 DIAGNOSIS — E1165 Type 2 diabetes mellitus with hyperglycemia: Secondary | ICD-10-CM | POA: Diagnosis not present

## 2018-01-03 DIAGNOSIS — Z23 Encounter for immunization: Secondary | ICD-10-CM | POA: Diagnosis not present

## 2018-01-03 DIAGNOSIS — Z6824 Body mass index (BMI) 24.0-24.9, adult: Secondary | ICD-10-CM | POA: Diagnosis not present

## 2018-01-03 DIAGNOSIS — J440 Chronic obstructive pulmonary disease with acute lower respiratory infection: Secondary | ICD-10-CM | POA: Diagnosis not present

## 2018-01-03 DIAGNOSIS — E782 Mixed hyperlipidemia: Secondary | ICD-10-CM | POA: Diagnosis not present

## 2018-01-03 DIAGNOSIS — Z1212 Encounter for screening for malignant neoplasm of rectum: Secondary | ICD-10-CM | POA: Diagnosis not present

## 2018-01-03 DIAGNOSIS — Z0001 Encounter for general adult medical examination with abnormal findings: Secondary | ICD-10-CM | POA: Diagnosis not present

## 2018-01-23 DIAGNOSIS — Z6825 Body mass index (BMI) 25.0-25.9, adult: Secondary | ICD-10-CM | POA: Diagnosis not present

## 2018-01-23 DIAGNOSIS — B360 Pityriasis versicolor: Secondary | ICD-10-CM | POA: Diagnosis not present

## 2018-02-16 DIAGNOSIS — F331 Major depressive disorder, recurrent, moderate: Secondary | ICD-10-CM | POA: Diagnosis not present

## 2018-02-16 DIAGNOSIS — E782 Mixed hyperlipidemia: Secondary | ICD-10-CM | POA: Diagnosis not present

## 2018-02-16 DIAGNOSIS — I10 Essential (primary) hypertension: Secondary | ICD-10-CM | POA: Diagnosis not present

## 2018-03-28 DIAGNOSIS — H401132 Primary open-angle glaucoma, bilateral, moderate stage: Secondary | ICD-10-CM | POA: Diagnosis not present

## 2018-06-17 DIAGNOSIS — I10 Essential (primary) hypertension: Secondary | ICD-10-CM | POA: Diagnosis not present

## 2018-06-17 DIAGNOSIS — E782 Mixed hyperlipidemia: Secondary | ICD-10-CM | POA: Diagnosis not present

## 2018-06-28 DIAGNOSIS — M79645 Pain in left finger(s): Secondary | ICD-10-CM | POA: Insufficient documentation

## 2018-06-28 DIAGNOSIS — M25512 Pain in left shoulder: Secondary | ICD-10-CM | POA: Insufficient documentation

## 2018-06-28 DIAGNOSIS — M65332 Trigger finger, left middle finger: Secondary | ICD-10-CM | POA: Diagnosis not present

## 2018-07-10 DIAGNOSIS — K219 Gastro-esophageal reflux disease without esophagitis: Secondary | ICD-10-CM | POA: Diagnosis not present

## 2018-07-10 DIAGNOSIS — E782 Mixed hyperlipidemia: Secondary | ICD-10-CM | POA: Diagnosis not present

## 2018-07-10 DIAGNOSIS — J440 Chronic obstructive pulmonary disease with acute lower respiratory infection: Secondary | ICD-10-CM | POA: Diagnosis not present

## 2018-07-10 DIAGNOSIS — I10 Essential (primary) hypertension: Secondary | ICD-10-CM | POA: Diagnosis not present

## 2018-07-10 DIAGNOSIS — E1165 Type 2 diabetes mellitus with hyperglycemia: Secondary | ICD-10-CM | POA: Diagnosis not present

## 2018-07-14 DIAGNOSIS — K219 Gastro-esophageal reflux disease without esophagitis: Secondary | ICD-10-CM | POA: Diagnosis not present

## 2018-07-14 DIAGNOSIS — Z1389 Encounter for screening for other disorder: Secondary | ICD-10-CM | POA: Diagnosis not present

## 2018-07-14 DIAGNOSIS — Z6824 Body mass index (BMI) 24.0-24.9, adult: Secondary | ICD-10-CM | POA: Diagnosis not present

## 2018-07-14 DIAGNOSIS — E1165 Type 2 diabetes mellitus with hyperglycemia: Secondary | ICD-10-CM | POA: Diagnosis not present

## 2018-07-14 DIAGNOSIS — I10 Essential (primary) hypertension: Secondary | ICD-10-CM | POA: Diagnosis not present

## 2018-07-14 DIAGNOSIS — F331 Major depressive disorder, recurrent, moderate: Secondary | ICD-10-CM | POA: Diagnosis not present

## 2018-07-14 DIAGNOSIS — J440 Chronic obstructive pulmonary disease with acute lower respiratory infection: Secondary | ICD-10-CM | POA: Diagnosis not present

## 2018-07-14 DIAGNOSIS — E782 Mixed hyperlipidemia: Secondary | ICD-10-CM | POA: Diagnosis not present

## 2018-07-24 DIAGNOSIS — M65332 Trigger finger, left middle finger: Secondary | ICD-10-CM | POA: Diagnosis not present

## 2018-08-21 DIAGNOSIS — N5231 Erectile dysfunction following radical prostatectomy: Secondary | ICD-10-CM | POA: Diagnosis not present

## 2018-08-21 DIAGNOSIS — N393 Stress incontinence (female) (male): Secondary | ICD-10-CM | POA: Diagnosis not present

## 2018-08-21 DIAGNOSIS — Z8546 Personal history of malignant neoplasm of prostate: Secondary | ICD-10-CM | POA: Diagnosis not present

## 2018-08-29 DIAGNOSIS — C61 Malignant neoplasm of prostate: Secondary | ICD-10-CM | POA: Diagnosis not present

## 2018-10-02 DIAGNOSIS — H401131 Primary open-angle glaucoma, bilateral, mild stage: Secondary | ICD-10-CM | POA: Diagnosis not present

## 2018-10-02 DIAGNOSIS — H5211 Myopia, right eye: Secondary | ICD-10-CM | POA: Diagnosis not present

## 2018-10-02 DIAGNOSIS — E119 Type 2 diabetes mellitus without complications: Secondary | ICD-10-CM | POA: Diagnosis not present

## 2018-10-02 DIAGNOSIS — Z961 Presence of intraocular lens: Secondary | ICD-10-CM | POA: Diagnosis not present

## 2019-01-15 DIAGNOSIS — K219 Gastro-esophageal reflux disease without esophagitis: Secondary | ICD-10-CM | POA: Diagnosis not present

## 2019-01-15 DIAGNOSIS — J440 Chronic obstructive pulmonary disease with acute lower respiratory infection: Secondary | ICD-10-CM | POA: Diagnosis not present

## 2019-01-15 DIAGNOSIS — I10 Essential (primary) hypertension: Secondary | ICD-10-CM | POA: Diagnosis not present

## 2019-01-15 DIAGNOSIS — E1165 Type 2 diabetes mellitus with hyperglycemia: Secondary | ICD-10-CM | POA: Diagnosis not present

## 2019-01-15 DIAGNOSIS — E782 Mixed hyperlipidemia: Secondary | ICD-10-CM | POA: Diagnosis not present

## 2019-01-18 DIAGNOSIS — E782 Mixed hyperlipidemia: Secondary | ICD-10-CM | POA: Diagnosis not present

## 2019-01-18 DIAGNOSIS — I10 Essential (primary) hypertension: Secondary | ICD-10-CM | POA: Diagnosis not present

## 2019-01-22 DIAGNOSIS — E1165 Type 2 diabetes mellitus with hyperglycemia: Secondary | ICD-10-CM | POA: Diagnosis not present

## 2019-01-22 DIAGNOSIS — Z0001 Encounter for general adult medical examination with abnormal findings: Secondary | ICD-10-CM | POA: Diagnosis not present

## 2019-01-22 DIAGNOSIS — K219 Gastro-esophageal reflux disease without esophagitis: Secondary | ICD-10-CM | POA: Diagnosis not present

## 2019-01-22 DIAGNOSIS — Z6824 Body mass index (BMI) 24.0-24.9, adult: Secondary | ICD-10-CM | POA: Diagnosis not present

## 2019-01-22 DIAGNOSIS — J440 Chronic obstructive pulmonary disease with acute lower respiratory infection: Secondary | ICD-10-CM | POA: Diagnosis not present

## 2019-01-22 DIAGNOSIS — I10 Essential (primary) hypertension: Secondary | ICD-10-CM | POA: Diagnosis not present

## 2019-01-22 DIAGNOSIS — Z1212 Encounter for screening for malignant neoplasm of rectum: Secondary | ICD-10-CM | POA: Diagnosis not present

## 2019-01-22 DIAGNOSIS — Z23 Encounter for immunization: Secondary | ICD-10-CM | POA: Diagnosis not present

## 2019-02-16 DIAGNOSIS — E7849 Other hyperlipidemia: Secondary | ICD-10-CM | POA: Diagnosis not present

## 2019-02-16 DIAGNOSIS — K2100 Gastro-esophageal reflux disease with esophagitis, without bleeding: Secondary | ICD-10-CM | POA: Diagnosis not present

## 2019-03-30 DIAGNOSIS — L821 Other seborrheic keratosis: Secondary | ICD-10-CM | POA: Diagnosis not present

## 2019-03-30 DIAGNOSIS — L814 Other melanin hyperpigmentation: Secondary | ICD-10-CM | POA: Diagnosis not present

## 2019-03-30 DIAGNOSIS — C44311 Basal cell carcinoma of skin of nose: Secondary | ICD-10-CM | POA: Diagnosis not present

## 2019-03-30 DIAGNOSIS — D485 Neoplasm of uncertain behavior of skin: Secondary | ICD-10-CM | POA: Diagnosis not present

## 2019-04-02 DIAGNOSIS — H401131 Primary open-angle glaucoma, bilateral, mild stage: Secondary | ICD-10-CM | POA: Diagnosis not present

## 2019-05-18 DIAGNOSIS — E7849 Other hyperlipidemia: Secondary | ICD-10-CM | POA: Diagnosis not present

## 2019-05-18 DIAGNOSIS — I10 Essential (primary) hypertension: Secondary | ICD-10-CM | POA: Diagnosis not present

## 2019-05-23 DIAGNOSIS — C44311 Basal cell carcinoma of skin of nose: Secondary | ICD-10-CM | POA: Diagnosis not present

## 2019-07-09 DIAGNOSIS — E109 Type 1 diabetes mellitus without complications: Secondary | ICD-10-CM | POA: Diagnosis not present

## 2019-07-09 DIAGNOSIS — M519 Unspecified thoracic, thoracolumbar and lumbosacral intervertebral disc disorder: Secondary | ICD-10-CM | POA: Diagnosis not present

## 2019-07-09 DIAGNOSIS — M5030 Other cervical disc degeneration, unspecified cervical region: Secondary | ICD-10-CM | POA: Insufficient documentation

## 2019-07-09 DIAGNOSIS — M542 Cervicalgia: Secondary | ICD-10-CM | POA: Insufficient documentation

## 2019-07-18 DIAGNOSIS — Z794 Long term (current) use of insulin: Secondary | ICD-10-CM | POA: Diagnosis not present

## 2019-07-18 DIAGNOSIS — I10 Essential (primary) hypertension: Secondary | ICD-10-CM | POA: Diagnosis not present

## 2019-07-18 DIAGNOSIS — E1165 Type 2 diabetes mellitus with hyperglycemia: Secondary | ICD-10-CM | POA: Diagnosis not present

## 2019-07-18 DIAGNOSIS — E7849 Other hyperlipidemia: Secondary | ICD-10-CM | POA: Diagnosis not present

## 2019-08-17 DIAGNOSIS — Z794 Long term (current) use of insulin: Secondary | ICD-10-CM | POA: Diagnosis not present

## 2019-08-17 DIAGNOSIS — E1165 Type 2 diabetes mellitus with hyperglycemia: Secondary | ICD-10-CM | POA: Diagnosis not present

## 2019-08-17 DIAGNOSIS — I10 Essential (primary) hypertension: Secondary | ICD-10-CM | POA: Diagnosis not present

## 2019-08-17 DIAGNOSIS — E7849 Other hyperlipidemia: Secondary | ICD-10-CM | POA: Diagnosis not present

## 2019-08-22 DIAGNOSIS — R05 Cough: Secondary | ICD-10-CM | POA: Diagnosis not present

## 2019-08-22 DIAGNOSIS — H8111 Benign paroxysmal vertigo, right ear: Secondary | ICD-10-CM | POA: Diagnosis not present

## 2019-08-22 DIAGNOSIS — Z20828 Contact with and (suspected) exposure to other viral communicable diseases: Secondary | ICD-10-CM | POA: Diagnosis not present

## 2019-08-22 DIAGNOSIS — R5383 Other fatigue: Secondary | ICD-10-CM | POA: Diagnosis not present

## 2019-08-27 DIAGNOSIS — C61 Malignant neoplasm of prostate: Secondary | ICD-10-CM | POA: Diagnosis not present

## 2019-08-27 DIAGNOSIS — N5231 Erectile dysfunction following radical prostatectomy: Secondary | ICD-10-CM | POA: Diagnosis not present

## 2019-09-20 DIAGNOSIS — H9201 Otalgia, right ear: Secondary | ICD-10-CM | POA: Diagnosis not present

## 2019-09-20 DIAGNOSIS — H903 Sensorineural hearing loss, bilateral: Secondary | ICD-10-CM | POA: Diagnosis not present

## 2019-09-20 DIAGNOSIS — R42 Dizziness and giddiness: Secondary | ICD-10-CM | POA: Diagnosis not present

## 2019-10-08 DIAGNOSIS — H04123 Dry eye syndrome of bilateral lacrimal glands: Secondary | ICD-10-CM | POA: Diagnosis not present

## 2019-10-08 DIAGNOSIS — H401131 Primary open-angle glaucoma, bilateral, mild stage: Secondary | ICD-10-CM | POA: Diagnosis not present

## 2019-10-09 DIAGNOSIS — J440 Chronic obstructive pulmonary disease with acute lower respiratory infection: Secondary | ICD-10-CM | POA: Diagnosis not present

## 2019-10-09 DIAGNOSIS — K219 Gastro-esophageal reflux disease without esophagitis: Secondary | ICD-10-CM | POA: Diagnosis not present

## 2019-10-09 DIAGNOSIS — I10 Essential (primary) hypertension: Secondary | ICD-10-CM | POA: Diagnosis not present

## 2019-10-09 DIAGNOSIS — E782 Mixed hyperlipidemia: Secondary | ICD-10-CM | POA: Diagnosis not present

## 2019-10-09 DIAGNOSIS — E1165 Type 2 diabetes mellitus with hyperglycemia: Secondary | ICD-10-CM | POA: Diagnosis not present

## 2019-10-11 DIAGNOSIS — F331 Major depressive disorder, recurrent, moderate: Secondary | ICD-10-CM | POA: Diagnosis not present

## 2019-10-11 DIAGNOSIS — I10 Essential (primary) hypertension: Secondary | ICD-10-CM | POA: Diagnosis not present

## 2019-10-11 DIAGNOSIS — Z6823 Body mass index (BMI) 23.0-23.9, adult: Secondary | ICD-10-CM | POA: Diagnosis not present

## 2019-10-11 DIAGNOSIS — E1165 Type 2 diabetes mellitus with hyperglycemia: Secondary | ICD-10-CM | POA: Diagnosis not present

## 2019-10-11 DIAGNOSIS — J440 Chronic obstructive pulmonary disease with acute lower respiratory infection: Secondary | ICD-10-CM | POA: Diagnosis not present

## 2019-10-11 DIAGNOSIS — K2100 Gastro-esophageal reflux disease with esophagitis, without bleeding: Secondary | ICD-10-CM | POA: Diagnosis not present

## 2019-10-11 DIAGNOSIS — E782 Mixed hyperlipidemia: Secondary | ICD-10-CM | POA: Diagnosis not present

## 2019-10-11 DIAGNOSIS — Z23 Encounter for immunization: Secondary | ICD-10-CM | POA: Diagnosis not present

## 2019-12-14 DIAGNOSIS — Z20828 Contact with and (suspected) exposure to other viral communicable diseases: Secondary | ICD-10-CM | POA: Diagnosis not present

## 2019-12-14 DIAGNOSIS — J019 Acute sinusitis, unspecified: Secondary | ICD-10-CM | POA: Diagnosis not present

## 2019-12-27 DIAGNOSIS — F331 Major depressive disorder, recurrent, moderate: Secondary | ICD-10-CM | POA: Diagnosis not present

## 2019-12-27 DIAGNOSIS — Z6822 Body mass index (BMI) 22.0-22.9, adult: Secondary | ICD-10-CM | POA: Diagnosis not present

## 2019-12-27 DIAGNOSIS — R634 Abnormal weight loss: Secondary | ICD-10-CM | POA: Diagnosis not present

## 2020-01-31 DIAGNOSIS — I10 Essential (primary) hypertension: Secondary | ICD-10-CM | POA: Diagnosis not present

## 2020-01-31 DIAGNOSIS — Z1212 Encounter for screening for malignant neoplasm of rectum: Secondary | ICD-10-CM | POA: Diagnosis not present

## 2020-01-31 DIAGNOSIS — E782 Mixed hyperlipidemia: Secondary | ICD-10-CM | POA: Diagnosis not present

## 2020-01-31 DIAGNOSIS — E7849 Other hyperlipidemia: Secondary | ICD-10-CM | POA: Diagnosis not present

## 2020-01-31 DIAGNOSIS — K2100 Gastro-esophageal reflux disease with esophagitis, without bleeding: Secondary | ICD-10-CM | POA: Diagnosis not present

## 2020-01-31 DIAGNOSIS — Z9189 Other specified personal risk factors, not elsewhere classified: Secondary | ICD-10-CM | POA: Diagnosis not present

## 2020-01-31 DIAGNOSIS — J301 Allergic rhinitis due to pollen: Secondary | ICD-10-CM | POA: Diagnosis not present

## 2020-01-31 DIAGNOSIS — Z6823 Body mass index (BMI) 23.0-23.9, adult: Secondary | ICD-10-CM | POA: Diagnosis not present

## 2020-01-31 DIAGNOSIS — F331 Major depressive disorder, recurrent, moderate: Secondary | ICD-10-CM | POA: Diagnosis not present

## 2020-01-31 DIAGNOSIS — J440 Chronic obstructive pulmonary disease with acute lower respiratory infection: Secondary | ICD-10-CM | POA: Diagnosis not present

## 2020-01-31 DIAGNOSIS — Z23 Encounter for immunization: Secondary | ICD-10-CM | POA: Diagnosis not present

## 2020-01-31 DIAGNOSIS — E1165 Type 2 diabetes mellitus with hyperglycemia: Secondary | ICD-10-CM | POA: Diagnosis not present

## 2020-02-20 DIAGNOSIS — D519 Vitamin B12 deficiency anemia, unspecified: Secondary | ICD-10-CM | POA: Diagnosis not present

## 2020-02-20 DIAGNOSIS — E7849 Other hyperlipidemia: Secondary | ICD-10-CM | POA: Diagnosis not present

## 2020-02-20 DIAGNOSIS — I10 Essential (primary) hypertension: Secondary | ICD-10-CM | POA: Diagnosis not present

## 2020-02-20 DIAGNOSIS — D649 Anemia, unspecified: Secondary | ICD-10-CM | POA: Diagnosis not present

## 2020-02-20 DIAGNOSIS — E1165 Type 2 diabetes mellitus with hyperglycemia: Secondary | ICD-10-CM | POA: Diagnosis not present

## 2020-02-20 DIAGNOSIS — E782 Mixed hyperlipidemia: Secondary | ICD-10-CM | POA: Diagnosis not present

## 2020-02-20 DIAGNOSIS — J440 Chronic obstructive pulmonary disease with acute lower respiratory infection: Secondary | ICD-10-CM | POA: Diagnosis not present

## 2020-02-20 DIAGNOSIS — D529 Folate deficiency anemia, unspecified: Secondary | ICD-10-CM | POA: Diagnosis not present

## 2020-02-20 DIAGNOSIS — K219 Gastro-esophageal reflux disease without esophagitis: Secondary | ICD-10-CM | POA: Diagnosis not present

## 2020-02-22 DIAGNOSIS — J440 Chronic obstructive pulmonary disease with acute lower respiratory infection: Secondary | ICD-10-CM | POA: Diagnosis not present

## 2020-02-22 DIAGNOSIS — E1165 Type 2 diabetes mellitus with hyperglycemia: Secondary | ICD-10-CM | POA: Diagnosis not present

## 2020-02-22 DIAGNOSIS — F331 Major depressive disorder, recurrent, moderate: Secondary | ICD-10-CM | POA: Diagnosis not present

## 2020-02-22 DIAGNOSIS — J61 Pneumoconiosis due to asbestos and other mineral fibers: Secondary | ICD-10-CM | POA: Diagnosis not present

## 2020-02-22 DIAGNOSIS — E7849 Other hyperlipidemia: Secondary | ICD-10-CM | POA: Diagnosis not present

## 2020-02-22 DIAGNOSIS — Z0001 Encounter for general adult medical examination with abnormal findings: Secondary | ICD-10-CM | POA: Diagnosis not present

## 2020-02-22 DIAGNOSIS — I10 Essential (primary) hypertension: Secondary | ICD-10-CM | POA: Diagnosis not present

## 2020-04-08 DIAGNOSIS — H401131 Primary open-angle glaucoma, bilateral, mild stage: Secondary | ICD-10-CM | POA: Diagnosis not present

## 2020-06-02 DIAGNOSIS — M19049 Primary osteoarthritis, unspecified hand: Secondary | ICD-10-CM | POA: Insufficient documentation

## 2020-06-02 DIAGNOSIS — M65322 Trigger finger, left index finger: Secondary | ICD-10-CM | POA: Diagnosis not present

## 2020-06-02 DIAGNOSIS — M13842 Other specified arthritis, left hand: Secondary | ICD-10-CM | POA: Diagnosis not present

## 2020-06-18 DIAGNOSIS — E1165 Type 2 diabetes mellitus with hyperglycemia: Secondary | ICD-10-CM | POA: Diagnosis not present

## 2020-06-18 DIAGNOSIS — E782 Mixed hyperlipidemia: Secondary | ICD-10-CM | POA: Diagnosis not present

## 2020-06-18 DIAGNOSIS — I10 Essential (primary) hypertension: Secondary | ICD-10-CM | POA: Diagnosis not present

## 2020-06-18 DIAGNOSIS — E7849 Other hyperlipidemia: Secondary | ICD-10-CM | POA: Diagnosis not present

## 2020-06-18 DIAGNOSIS — K219 Gastro-esophageal reflux disease without esophagitis: Secondary | ICD-10-CM | POA: Diagnosis not present

## 2020-06-18 DIAGNOSIS — J440 Chronic obstructive pulmonary disease with acute lower respiratory infection: Secondary | ICD-10-CM | POA: Diagnosis not present

## 2020-06-26 DIAGNOSIS — J440 Chronic obstructive pulmonary disease with acute lower respiratory infection: Secondary | ICD-10-CM | POA: Diagnosis not present

## 2020-06-26 DIAGNOSIS — J61 Pneumoconiosis due to asbestos and other mineral fibers: Secondary | ICD-10-CM | POA: Diagnosis not present

## 2020-06-26 DIAGNOSIS — E1165 Type 2 diabetes mellitus with hyperglycemia: Secondary | ICD-10-CM | POA: Diagnosis not present

## 2020-06-26 DIAGNOSIS — I10 Essential (primary) hypertension: Secondary | ICD-10-CM | POA: Diagnosis not present

## 2020-06-26 DIAGNOSIS — Z7189 Other specified counseling: Secondary | ICD-10-CM | POA: Diagnosis not present

## 2020-06-26 DIAGNOSIS — F331 Major depressive disorder, recurrent, moderate: Secondary | ICD-10-CM | POA: Diagnosis not present

## 2020-06-26 DIAGNOSIS — K2100 Gastro-esophageal reflux disease with esophagitis, without bleeding: Secondary | ICD-10-CM | POA: Diagnosis not present

## 2020-06-26 DIAGNOSIS — E7849 Other hyperlipidemia: Secondary | ICD-10-CM | POA: Diagnosis not present

## 2020-09-04 DIAGNOSIS — N5231 Erectile dysfunction following radical prostatectomy: Secondary | ICD-10-CM | POA: Diagnosis not present

## 2020-09-04 DIAGNOSIS — C61 Malignant neoplasm of prostate: Secondary | ICD-10-CM | POA: Diagnosis not present

## 2020-10-17 DIAGNOSIS — Z961 Presence of intraocular lens: Secondary | ICD-10-CM | POA: Diagnosis not present

## 2020-10-17 DIAGNOSIS — H401131 Primary open-angle glaucoma, bilateral, mild stage: Secondary | ICD-10-CM | POA: Diagnosis not present

## 2020-10-17 DIAGNOSIS — H5211 Myopia, right eye: Secondary | ICD-10-CM | POA: Diagnosis not present

## 2020-10-24 DIAGNOSIS — E782 Mixed hyperlipidemia: Secondary | ICD-10-CM | POA: Diagnosis not present

## 2020-10-24 DIAGNOSIS — E1165 Type 2 diabetes mellitus with hyperglycemia: Secondary | ICD-10-CM | POA: Diagnosis not present

## 2020-10-24 DIAGNOSIS — I10 Essential (primary) hypertension: Secondary | ICD-10-CM | POA: Diagnosis not present

## 2020-10-24 DIAGNOSIS — E7849 Other hyperlipidemia: Secondary | ICD-10-CM | POA: Diagnosis not present

## 2020-10-24 DIAGNOSIS — Z1329 Encounter for screening for other suspected endocrine disorder: Secondary | ICD-10-CM | POA: Diagnosis not present

## 2020-10-24 DIAGNOSIS — K2100 Gastro-esophageal reflux disease with esophagitis, without bleeding: Secondary | ICD-10-CM | POA: Diagnosis not present

## 2020-10-24 DIAGNOSIS — R5383 Other fatigue: Secondary | ICD-10-CM | POA: Diagnosis not present

## 2020-10-28 DIAGNOSIS — Z7189 Other specified counseling: Secondary | ICD-10-CM | POA: Diagnosis not present

## 2020-10-28 DIAGNOSIS — E1165 Type 2 diabetes mellitus with hyperglycemia: Secondary | ICD-10-CM | POA: Diagnosis not present

## 2020-10-28 DIAGNOSIS — J61 Pneumoconiosis due to asbestos and other mineral fibers: Secondary | ICD-10-CM | POA: Diagnosis not present

## 2020-10-28 DIAGNOSIS — I10 Essential (primary) hypertension: Secondary | ICD-10-CM | POA: Diagnosis not present

## 2020-10-28 DIAGNOSIS — F331 Major depressive disorder, recurrent, moderate: Secondary | ICD-10-CM | POA: Diagnosis not present

## 2020-10-28 DIAGNOSIS — Z23 Encounter for immunization: Secondary | ICD-10-CM | POA: Diagnosis not present

## 2020-10-28 DIAGNOSIS — J440 Chronic obstructive pulmonary disease with acute lower respiratory infection: Secondary | ICD-10-CM | POA: Diagnosis not present

## 2020-10-28 DIAGNOSIS — E7849 Other hyperlipidemia: Secondary | ICD-10-CM | POA: Diagnosis not present

## 2021-01-01 DIAGNOSIS — R109 Unspecified abdominal pain: Secondary | ICD-10-CM | POA: Diagnosis not present

## 2021-01-01 DIAGNOSIS — Z6822 Body mass index (BMI) 22.0-22.9, adult: Secondary | ICD-10-CM | POA: Diagnosis not present

## 2021-01-01 DIAGNOSIS — K5792 Diverticulitis of intestine, part unspecified, without perforation or abscess without bleeding: Secondary | ICD-10-CM | POA: Diagnosis not present

## 2021-01-05 DIAGNOSIS — K5792 Diverticulitis of intestine, part unspecified, without perforation or abscess without bleeding: Secondary | ICD-10-CM | POA: Diagnosis not present

## 2021-01-05 DIAGNOSIS — N281 Cyst of kidney, acquired: Secondary | ICD-10-CM | POA: Diagnosis not present

## 2021-01-05 DIAGNOSIS — I700 Atherosclerosis of aorta: Secondary | ICD-10-CM | POA: Diagnosis not present

## 2021-01-05 DIAGNOSIS — R109 Unspecified abdominal pain: Secondary | ICD-10-CM | POA: Diagnosis not present

## 2021-02-02 ENCOUNTER — Other Ambulatory Visit: Payer: Self-pay

## 2021-02-02 ENCOUNTER — Emergency Department (HOSPITAL_COMMUNITY): Payer: PPO

## 2021-02-02 ENCOUNTER — Emergency Department (HOSPITAL_COMMUNITY)
Admission: EM | Admit: 2021-02-02 | Discharge: 2021-02-02 | Disposition: A | Discharge status: Home / Self Care | Payer: PPO | Attending: Emergency Medicine | Admitting: Emergency Medicine

## 2021-02-02 ENCOUNTER — Encounter (HOSPITAL_COMMUNITY): Payer: Self-pay | Admitting: *Deleted

## 2021-02-02 DIAGNOSIS — R1032 Left lower quadrant pain: Secondary | ICD-10-CM | POA: Diagnosis not present

## 2021-02-02 DIAGNOSIS — Z794 Long term (current) use of insulin: Secondary | ICD-10-CM | POA: Insufficient documentation

## 2021-02-02 DIAGNOSIS — I700 Atherosclerosis of aorta: Secondary | ICD-10-CM | POA: Diagnosis not present

## 2021-02-02 DIAGNOSIS — R109 Unspecified abdominal pain: Secondary | ICD-10-CM | POA: Diagnosis not present

## 2021-02-02 DIAGNOSIS — E119 Type 2 diabetes mellitus without complications: Secondary | ICD-10-CM | POA: Insufficient documentation

## 2021-02-02 DIAGNOSIS — Z7984 Long term (current) use of oral hypoglycemic drugs: Secondary | ICD-10-CM | POA: Insufficient documentation

## 2021-02-02 LAB — COMPREHENSIVE METABOLIC PANEL
ALT: 15 U/L (ref 0–44)
AST: 15 U/L (ref 15–41)
Albumin: 3.3 g/dL — ABNORMAL LOW (ref 3.5–5.0)
Alkaline Phosphatase: 80 U/L (ref 38–126)
Anion gap: 7 (ref 5–15)
BUN: 19 mg/dL (ref 8–23)
CO2: 26 mmol/L (ref 22–32)
Calcium: 8.7 mg/dL — ABNORMAL LOW (ref 8.9–10.3)
Chloride: 100 mmol/L (ref 98–111)
Creatinine, Ser: 0.96 mg/dL (ref 0.61–1.24)
GFR, Estimated: >60 mL/min (ref >60)
Glucose, Bld: 236 mg/dL — ABNORMAL HIGH (ref 70–99)
Potassium: 4.8 mmol/L (ref 3.5–5.1)
Sodium: 133 mmol/L — ABNORMAL LOW (ref 135–145)
Total Bilirubin: 0.3 mg/dL (ref 0.3–1.2)
Total Protein: 6.9 g/dL (ref 6.5–8.1)

## 2021-02-02 LAB — URINALYSIS, MICROSCOPIC (REFLEX)

## 2021-02-02 LAB — CBC
HCT: 34.5 % — ABNORMAL LOW (ref 39.0–52.0)
Hemoglobin: 11.4 g/dL — ABNORMAL LOW (ref 13.0–17.0)
MCH: 30.2 pg (ref 26.0–34.0)
MCHC: 33 g/dL (ref 30.0–36.0)
MCV: 91.5 fL (ref 80.0–100.0)
Platelets: 333 10*3/uL (ref 150–400)
RBC: 3.77 MIL/uL — ABNORMAL LOW (ref 4.22–5.81)
RDW: 12.1 % (ref 11.5–15.5)
WBC: 8.2 10*3/uL (ref 4.0–10.5)
nRBC: 0 % (ref 0.0–0.2)

## 2021-02-02 LAB — URINALYSIS, ROUTINE W REFLEX MICROSCOPIC
Bilirubin Urine: NEGATIVE
Glucose, UA: NEGATIVE mg/dL
Hgb urine dipstick: NEGATIVE
Ketones, ur: NEGATIVE mg/dL
Nitrite: NEGATIVE
Protein, ur: NEGATIVE mg/dL
Specific Gravity, Urine: <1.005 — ABNORMAL LOW (ref 1.005–1.030)
pH: 7 (ref 5.0–8.0)

## 2021-02-02 LAB — LIPASE, BLOOD: Lipase: 33 U/L (ref 11–51)

## 2021-02-02 MED ORDER — IOHEXOL 300 MG/ML  SOLN
100.0000 mL | Freq: Once | INTRAMUSCULAR | Status: AC | PRN
  Administered 2021-02-02: 100 mL via INTRAVENOUS

## 2021-02-02 MED ORDER — MORPHINE SULFATE (PF) 2 MG/ML IV SOLN
2.0000 mg | Freq: Once | INTRAVENOUS | Status: DC
  Filled 2021-02-02: qty 1

## 2021-02-02 MED ORDER — HYDROCODONE-ACETAMINOPHEN 5-325 MG PO TABS
1.0000 | ORAL_TABLET | Freq: Four times a day (QID) | ORAL | 0 refills | Status: DC | PRN
Start: 2021-02-02 — End: 2021-09-25

## 2021-02-02 NOTE — ED Triage Notes (Signed)
Pt c/o LLQ abdominal pain x 2 weeks. Pt reports 3 weeks ago he was diagnosed with diverticulitis and was given a 7 day course of antibiotics. Pt reports the pain got better while taking the antibiotics, but is now coming back. Pt also c/o nausea.

## 2021-02-02 NOTE — Discharge Instructions (Signed)
Follow-up with your family doctor within a week and get scheduled for an MRI of your kidneys.  Also get him rechecked your belly pain to see if it is improving

## 2021-02-02 NOTE — ED Notes (Signed)
Pt verbalized understanding of no driving and to use caution within 4 hours of taking pain meds due to meds cause drowsiness 

## 2021-02-02 NOTE — ED Provider Notes (Signed)
[facility] EMERGENCY DEPARTMENT Provider Note   CSN: [id number] Arrival date & time: 02/02/21  1046     History  Chief Complaint  Patient presents with   Abdominal Pain    Tommy Graham is a 81 y.o. male.  Patient complains of left lower quadrant abdominal pain.  He has a history of diabetes.  He was recently treated for diverticulitis  The history is provided by the patient and medical records. No language interpreter was used.  Abdominal Pain Pain location:  Generalized Pain quality: aching   Pain radiates to:  Does not radiate Pain severity:  Moderate Onset quality:  Sudden Timing:  Constant Progression:  Waxing and waning Chronicity:  New Context: not alcohol use   Relieved by:  Nothing Worsened by:  Nothing Associated symptoms: no chest pain, no cough, no diarrhea, no fatigue and no hematuria       Home Medications Prior to Admission medications   Medication Sig Start Date End Date Taking? Authorizing Provider  amLODipine (NORVASC) 5 MG tablet Take 10 mg by mouth daily.   Yes [provider]  dorzolamide (TRUSOPT) 2 % ophthalmic solution Place 1 drop into both eyes in the morning. 12/23/20  Yes [provider]  DULoxetine (CYMBALTA) 30 MG capsule Take 30 mg by mouth daily.   Yes [provider]  escitalopram (LEXAPRO) 10 MG tablet Take 10 mg by mouth daily.   Yes [provider]  HYDROcodone-acetaminophen (NORCO/VICODIN) 5-325 MG tablet Take 1 tablet by mouth every 6 (six) hours as needed. 02/02/21  Yes Milton Ferguson, MD  insulin glargine (LANTUS) 100 UNIT/ML injection Inject 20 Units into the skin at bedtime.   Yes [provider]  latanoprost (XALATAN) 0.005 % ophthalmic solution Place 1 drop into both eyes at bedtime. 12/23/20  Yes [provider]  lisinopril (ZESTRIL) 20 MG tablet Take 20 mg by mouth daily.   Yes [provider]  metFORMIN (GLUCOPHAGE) 850 MG tablet Take 850 mg by mouth in the  morning and at bedtime.   Yes [provider]  propranolol (INDERAL) 10 MG tablet Take 10 mg by mouth in the morning, at noon, and at bedtime.   Yes [provider]      Allergies    Diphenhydramine hcl and Nifedipine    Review of Systems   Review of Systems  Constitutional:  Negative for appetite change and fatigue.  HENT:  Negative for congestion, ear discharge and sinus pressure.   Eyes:  Negative for discharge.  Respiratory:  Negative for cough.   Cardiovascular:  Negative for chest pain.  Gastrointestinal:  Positive for abdominal pain. Negative for diarrhea.  Genitourinary:  Negative for frequency and hematuria.  Musculoskeletal:  Negative for back pain.  Skin:  Negative for rash.  Neurological:  Negative for seizures and headaches.  Psychiatric/Behavioral:  Negative for hallucinations.    Physical Exam Updated Vital Signs BP 136/65    Pulse 82    Temp 97.8 F (36.6 C) (Oral)    Resp 13    Ht 5\' 9"  (1.753 m)    Wt 65.8 kg    SpO2 98%    BMI 21.41 kg/m  Physical Exam Vitals and nursing note reviewed.  Constitutional:      Appearance: He is well-developed.  HENT:     Head: Normocephalic.     Mouth/Throat:     Mouth: Mucous membranes are moist.  Eyes:     General: No scleral icterus.    Conjunctiva/sclera: Conjunctivae  normal.  Neck:     Thyroid: No thyromegaly.  Cardiovascular:     Rate and Rhythm: Normal rate and regular rhythm.     Heart sounds: No murmur heard.   No friction rub. No gallop.  Pulmonary:     Breath sounds: No stridor. No wheezing or rales.  Chest:     Chest wall: No tenderness.  Abdominal:     General: There is no distension.     Tenderness: There is abdominal tenderness. There is no rebound.  Musculoskeletal:        General: Normal range of motion.     Cervical back: Neck supple.  Lymphadenopathy:     Cervical: No cervical adenopathy.  Skin:    Findings: No erythema or rash.  Neurological:     Mental Status: He is alert  and oriented to person, place, and time.     Motor: No abnormal muscle tone.     Coordination: Coordination normal.  Psychiatric:        Behavior: Behavior normal.    ED Results / Procedures / Treatments   Labs (all labs ordered are listed, but only abnormal results are displayed) Labs Reviewed  COMPREHENSIVE METABOLIC PANEL - Abnormal; Notable for the following components:      Result Value   Sodium 133 (*)    Glucose, Bld 236 (*)    Calcium 8.7 (*)    Albumin 3.3 (*)    All other components within normal limits  CBC - Abnormal; Notable for the following components:   RBC 3.77 (*)    Hemoglobin 11.4 (*)    HCT 34.5 (*)    All other components within normal limits  LIPASE, BLOOD  URINALYSIS, ROUTINE W REFLEX MICROSCOPIC    EKG None  Radiology CT ABDOMEN PELVIS W CONTRAST  Result Date: 02/02/2021 CLINICAL DATA:  Left abdomen pain for 2 weeks. Recently diagnosed with diverticulitis with antibiotic treatment 3 weeks ago. EXAM: CT ABDOMEN AND PELVIS WITH CONTRAST TECHNIQUE: Multidetector CT imaging of the abdomen and pelvis was performed using the standard protocol following bolus administration of intravenous contrast. RADIATION DOSE REDUCTION: This exam was performed according to the departmental dose-optimization program which includes automated exposure control, adjustment of the mA and/or kV according to patient size and/or use of iterative reconstruction technique. CONTRAST:  162mL OMNIPAQUE IOHEXOL 300 MG/ML  SOLN COMPARISON:  Abdomen ultrasound January 05, 2021 FINDINGS: Lower chest: No acute abnormality. Hepatobiliary: No focal liver abnormality is seen. No gallstones, gallbladder wall thickening, or biliary dilatation. Pancreas: Unremarkable. No pancreatic ductal dilatation or surrounding inflammatory changes. Spleen: Normal in size without focal abnormality. Adrenals/Urinary Tract: The bilateral adrenal glands are normal. In the midpole right kidney, there is a 2.6 x 1.8 cm  slight low-density enhancing mass. Several simple cysts are identified in bilateral kidneys, largest measures 2.5 x 2.3 cm in the midpole left kidney. There is no hydronephrosis bilaterally. The bladder is normal. Stomach/Bowel: Stomach is within normal limits. Appendix appears normal. No evidence of bowel wall thickening, distention, or inflammatory changes. There is no evidence of diverticulitis. Vascular/Lymphatic: Aortic atherosclerosis. No enlarged abdominal or pelvic lymph nodes. Reproductive: Prostate calcifications are identified. Other: Bilateral inguinal herniation of mesenteric fat are noted. Musculoskeletal: Degenerative joint changes of the spine are identified. Mild generalized increased sclerosis is identified in S1 nonspecific. IMPRESSION: 1. No acute abnormality identified in the abdomen and pelvis. No evidence of diverticulitis. 2. 2.6 cm slight low-density enhancing mass in the midpole right kidney. Renal cell carcinoma is not  excluded. Recommend further evaluation with MRI of the kidneys on outpatient basis. Aortic Atherosclerosis (ICD10-I70.0). Electronically Signed   By: Abelardo Diesel M.D.   On: 02/02/2021 13:11    Procedures Procedures    Medications Ordered in ED Medications  morphine 2 MG/ML injection 2 mg (2 mg Intravenous Patient Refused/Not Given 02/02/21 1156)  iohexol (OMNIPAQUE) 300 MG/ML solution 100 mL (100 mLs Intravenous Contrast Given 02/02/21 1251)    ED Course/ Medical Decision Making/ A&P                           Medical Decision Making Amount and/or Complexity of Data Reviewed Labs: ordered. Radiology: ordered.  Risk Prescription drug management.   Patient with continued left lower quadrant tenderness.  CT scan unremarkable except for questionable lesion in his right kidney.  He is supposed to get an MRI of his kidney for further evaluation.  He is given pain medicine will follow up with PCP for his abdominal pain and an MRI scheduled    This patient  presents to the ED for concern of abdominal pain, this involves an extensive number of treatment options, and is a complaint that carries with it a high risk of complications and morbidity.  The differential diagnosis includes: Colon cancer, diverticulitis,   Co morbidities that complicate the patient evaluation  History diabetic and diverticulitis   Additional history obtained:  Additional history obtained from wife External records from outside source obtained and reviewed including hospital record   Lab Tests:  I Ordered, and personally interpreted labs.  The pertinent results include: CBC unremarkable except hemoglobin low 11.4 chemistries unremarkable   Imaging Studies ordered:  I ordered imaging studies including CT scan of the abdomen I independently visualized and interpreted imaging which showed no diverticulitis but questionable mass in right kidney I agree with the radiologist interpretation   Cardiac Monitoring:  The patient was maintained on a cardiac monitor.  I personally viewed and interpreted the cardiac monitored which showed an underlying rhythm of: Normal sinus rhythm   Medicines ordered and prescription drug management:  No medicines given Reevaluation of the patient after these medicines showed that the patient stayed the same I have reviewed the patients home medicines and have made adjustments as needed   Test Considered:  MRI of the abdomen   Critical Interventions:  None  Consultations Obtained:  No consult Problem List / ED Course:  Abdominal pain continued, mass in right kidney   Reevaluation:  After the interventions noted above, I reevaluated the patient and found that they have :stayed the same   Social Determinants of Health:  None   Dispostion:  After consideration of the diagnostic results and the patients response to treatment, I feel that the patent would benefit from discharge home follow-up with PCP and his  doctor can schedule MRI with kidney.         Final Clinical Impression(s) / ED Diagnoses Final diagnoses:  Left lower quadrant abdominal pain    Rx / DC Orders ED Discharge Orders          Ordered    HYDROcodone-acetaminophen (NORCO/VICODIN) 5-325 MG tablet  Every 6 hours PRN        02/02/21 1503              Milton Ferguson, MD 02/05/21 1147

## 2021-02-03 DIAGNOSIS — Z6822 Body mass index (BMI) 22.0-22.9, adult: Secondary | ICD-10-CM | POA: Diagnosis not present

## 2021-02-03 DIAGNOSIS — N2889 Other specified disorders of kidney and ureter: Secondary | ICD-10-CM | POA: Diagnosis not present

## 2021-02-04 ENCOUNTER — Other Ambulatory Visit: Payer: Self-pay | Admitting: Family Medicine

## 2021-02-04 ENCOUNTER — Other Ambulatory Visit (HOSPITAL_COMMUNITY): Payer: Self-pay | Admitting: Family Medicine

## 2021-02-04 DIAGNOSIS — N2889 Other specified disorders of kidney and ureter: Secondary | ICD-10-CM

## 2021-02-18 ENCOUNTER — Ambulatory Visit (HOSPITAL_COMMUNITY)
Admission: RE | Admit: 2021-02-18 | Discharge: 2021-02-18 | Disposition: A | Discharge status: Home / Self Care | Payer: PPO | Source: Ambulatory Visit | Attending: Family Medicine | Admitting: Family Medicine

## 2021-02-18 ENCOUNTER — Other Ambulatory Visit: Payer: Self-pay

## 2021-02-18 DIAGNOSIS — N2889 Other specified disorders of kidney and ureter: Secondary | ICD-10-CM | POA: Insufficient documentation

## 2021-02-18 DIAGNOSIS — K862 Cyst of pancreas: Secondary | ICD-10-CM | POA: Diagnosis not present

## 2021-02-18 DIAGNOSIS — N281 Cyst of kidney, acquired: Secondary | ICD-10-CM | POA: Diagnosis not present

## 2021-02-18 DIAGNOSIS — K5730 Diverticulosis of large intestine without perforation or abscess without bleeding: Secondary | ICD-10-CM | POA: Diagnosis not present

## 2021-02-18 MED ORDER — GADOBUTROL 1 MMOL/ML IV SOLN
7.0000 mL | Freq: Once | INTRAVENOUS | Status: AC | PRN
  Administered 2021-02-18: 7 mL via INTRAVENOUS

## 2021-02-26 DIAGNOSIS — I10 Essential (primary) hypertension: Secondary | ICD-10-CM | POA: Diagnosis not present

## 2021-02-26 DIAGNOSIS — D519 Vitamin B12 deficiency anemia, unspecified: Secondary | ICD-10-CM | POA: Diagnosis not present

## 2021-02-26 DIAGNOSIS — K2100 Gastro-esophageal reflux disease with esophagitis, without bleeding: Secondary | ICD-10-CM | POA: Diagnosis not present

## 2021-02-26 DIAGNOSIS — E7849 Other hyperlipidemia: Secondary | ICD-10-CM | POA: Diagnosis not present

## 2021-02-26 DIAGNOSIS — E1165 Type 2 diabetes mellitus with hyperglycemia: Secondary | ICD-10-CM | POA: Diagnosis not present

## 2021-02-26 DIAGNOSIS — Z1329 Encounter for screening for other suspected endocrine disorder: Secondary | ICD-10-CM | POA: Diagnosis not present

## 2021-02-26 DIAGNOSIS — E782 Mixed hyperlipidemia: Secondary | ICD-10-CM | POA: Diagnosis not present

## 2021-03-05 DIAGNOSIS — J440 Chronic obstructive pulmonary disease with acute lower respiratory infection: Secondary | ICD-10-CM | POA: Diagnosis not present

## 2021-03-05 DIAGNOSIS — Z1331 Encounter for screening for depression: Secondary | ICD-10-CM | POA: Diagnosis not present

## 2021-03-05 DIAGNOSIS — E1165 Type 2 diabetes mellitus with hyperglycemia: Secondary | ICD-10-CM | POA: Diagnosis not present

## 2021-03-05 DIAGNOSIS — E538 Deficiency of other specified B group vitamins: Secondary | ICD-10-CM | POA: Diagnosis not present

## 2021-03-05 DIAGNOSIS — Z0001 Encounter for general adult medical examination with abnormal findings: Secondary | ICD-10-CM | POA: Diagnosis not present

## 2021-03-05 DIAGNOSIS — K5790 Diverticulosis of intestine, part unspecified, without perforation or abscess without bleeding: Secondary | ICD-10-CM | POA: Diagnosis not present

## 2021-03-05 DIAGNOSIS — I10 Essential (primary) hypertension: Secondary | ICD-10-CM | POA: Diagnosis not present

## 2021-03-05 DIAGNOSIS — Z1389 Encounter for screening for other disorder: Secondary | ICD-10-CM | POA: Diagnosis not present

## 2021-03-12 DIAGNOSIS — E538 Deficiency of other specified B group vitamins: Secondary | ICD-10-CM | POA: Diagnosis not present

## 2021-03-19 DIAGNOSIS — E538 Deficiency of other specified B group vitamins: Secondary | ICD-10-CM | POA: Diagnosis not present

## 2021-03-26 DIAGNOSIS — E538 Deficiency of other specified B group vitamins: Secondary | ICD-10-CM | POA: Diagnosis not present

## 2021-04-17 DIAGNOSIS — H401131 Primary open-angle glaucoma, bilateral, mild stage: Secondary | ICD-10-CM | POA: Diagnosis not present

## 2021-04-27 DIAGNOSIS — E538 Deficiency of other specified B group vitamins: Secondary | ICD-10-CM | POA: Diagnosis not present

## 2021-04-27 DIAGNOSIS — I10 Essential (primary) hypertension: Secondary | ICD-10-CM | POA: Diagnosis not present

## 2021-05-20 DIAGNOSIS — M25551 Pain in right hip: Secondary | ICD-10-CM | POA: Diagnosis not present

## 2021-05-20 DIAGNOSIS — M25552 Pain in left hip: Secondary | ICD-10-CM | POA: Diagnosis not present

## 2021-05-20 DIAGNOSIS — M545 Low back pain, unspecified: Secondary | ICD-10-CM | POA: Diagnosis not present

## 2021-05-27 DIAGNOSIS — E538 Deficiency of other specified B group vitamins: Secondary | ICD-10-CM | POA: Diagnosis not present

## 2021-06-29 DIAGNOSIS — E538 Deficiency of other specified B group vitamins: Secondary | ICD-10-CM | POA: Diagnosis not present

## 2021-07-01 DIAGNOSIS — Z981 Arthrodesis status: Secondary | ICD-10-CM | POA: Diagnosis not present

## 2021-07-01 DIAGNOSIS — M545 Low back pain, unspecified: Secondary | ICD-10-CM | POA: Insufficient documentation

## 2021-07-01 DIAGNOSIS — M546 Pain in thoracic spine: Secondary | ICD-10-CM | POA: Diagnosis not present

## 2021-07-01 DIAGNOSIS — M542 Cervicalgia: Secondary | ICD-10-CM | POA: Diagnosis not present

## 2021-07-01 DIAGNOSIS — M5459 Other low back pain: Secondary | ICD-10-CM | POA: Diagnosis not present

## 2021-07-10 DIAGNOSIS — M479 Spondylosis, unspecified: Secondary | ICD-10-CM | POA: Diagnosis not present

## 2021-07-10 DIAGNOSIS — M5416 Radiculopathy, lumbar region: Secondary | ICD-10-CM | POA: Diagnosis not present

## 2021-07-10 DIAGNOSIS — M5412 Radiculopathy, cervical region: Secondary | ICD-10-CM | POA: Diagnosis not present

## 2021-07-15 DIAGNOSIS — M4802 Spinal stenosis, cervical region: Secondary | ICD-10-CM | POA: Diagnosis not present

## 2021-07-15 DIAGNOSIS — M5030 Other cervical disc degeneration, unspecified cervical region: Secondary | ICD-10-CM | POA: Diagnosis not present

## 2021-07-16 DIAGNOSIS — M5416 Radiculopathy, lumbar region: Secondary | ICD-10-CM | POA: Insufficient documentation

## 2021-07-29 DIAGNOSIS — M5459 Other low back pain: Secondary | ICD-10-CM | POA: Diagnosis not present

## 2021-07-30 DIAGNOSIS — E538 Deficiency of other specified B group vitamins: Secondary | ICD-10-CM | POA: Diagnosis not present

## 2021-08-05 DIAGNOSIS — R030 Elevated blood-pressure reading, without diagnosis of hypertension: Secondary | ICD-10-CM | POA: Diagnosis not present

## 2021-08-05 DIAGNOSIS — B001 Herpesviral vesicular dermatitis: Secondary | ICD-10-CM | POA: Diagnosis not present

## 2021-08-05 DIAGNOSIS — Z6822 Body mass index (BMI) 22.0-22.9, adult: Secondary | ICD-10-CM | POA: Diagnosis not present

## 2021-08-13 DIAGNOSIS — M5416 Radiculopathy, lumbar region: Secondary | ICD-10-CM | POA: Diagnosis not present

## 2021-08-14 DIAGNOSIS — M5459 Other low back pain: Secondary | ICD-10-CM | POA: Diagnosis not present

## 2021-09-07 DIAGNOSIS — E538 Deficiency of other specified B group vitamins: Secondary | ICD-10-CM | POA: Diagnosis not present

## 2021-09-17 ENCOUNTER — Encounter (HOSPITAL_COMMUNITY)
Admission: EM | Disposition: A | Discharge status: Home / Self Care | Payer: Self-pay | Source: Home / Self Care | Attending: Cardiovascular Disease

## 2021-09-17 ENCOUNTER — Other Ambulatory Visit: Payer: Self-pay

## 2021-09-17 ENCOUNTER — Emergency Department (HOSPITAL_COMMUNITY): Payer: PPO

## 2021-09-17 ENCOUNTER — Inpatient Hospital Stay (HOSPITAL_COMMUNITY)
Admission: EM | Admit: 2021-09-17 | Discharge: 2021-09-25 | DRG: 246 | Disposition: A | Discharge status: Home / Self Care | Payer: PPO | Attending: Cardiovascular Disease | Admitting: Cardiovascular Disease

## 2021-09-17 ENCOUNTER — Encounter (HOSPITAL_COMMUNITY): Payer: Self-pay

## 2021-09-17 DIAGNOSIS — E11649 Type 2 diabetes mellitus with hypoglycemia without coma: Secondary | ICD-10-CM | POA: Diagnosis not present

## 2021-09-17 DIAGNOSIS — I252 Old myocardial infarction: Secondary | ICD-10-CM

## 2021-09-17 DIAGNOSIS — R42 Dizziness and giddiness: Secondary | ICD-10-CM | POA: Diagnosis not present

## 2021-09-17 DIAGNOSIS — R251 Tremor, unspecified: Secondary | ICD-10-CM | POA: Diagnosis present

## 2021-09-17 DIAGNOSIS — R1013 Epigastric pain: Secondary | ICD-10-CM | POA: Diagnosis not present

## 2021-09-17 DIAGNOSIS — Z955 Presence of coronary angioplasty implant and graft: Secondary | ICD-10-CM

## 2021-09-17 DIAGNOSIS — N179 Acute kidney failure, unspecified: Secondary | ICD-10-CM | POA: Diagnosis present

## 2021-09-17 DIAGNOSIS — I213 ST elevation (STEMI) myocardial infarction of unspecified site: Principal | ICD-10-CM

## 2021-09-17 DIAGNOSIS — I5021 Acute systolic (congestive) heart failure: Secondary | ICD-10-CM | POA: Diagnosis present

## 2021-09-17 DIAGNOSIS — R61 Generalized hyperhidrosis: Secondary | ICD-10-CM | POA: Diagnosis not present

## 2021-09-17 DIAGNOSIS — R918 Other nonspecific abnormal finding of lung field: Secondary | ICD-10-CM | POA: Diagnosis not present

## 2021-09-17 DIAGNOSIS — I493 Ventricular premature depolarization: Secondary | ICD-10-CM | POA: Diagnosis not present

## 2021-09-17 DIAGNOSIS — Z8546 Personal history of malignant neoplasm of prostate: Secondary | ICD-10-CM | POA: Diagnosis not present

## 2021-09-17 DIAGNOSIS — Z20822 Contact with and (suspected) exposure to covid-19: Secondary | ICD-10-CM | POA: Diagnosis not present

## 2021-09-17 DIAGNOSIS — E1165 Type 2 diabetes mellitus with hyperglycemia: Secondary | ICD-10-CM | POA: Diagnosis present

## 2021-09-17 DIAGNOSIS — Z794 Long term (current) use of insulin: Secondary | ICD-10-CM | POA: Diagnosis not present

## 2021-09-17 DIAGNOSIS — E782 Mixed hyperlipidemia: Secondary | ICD-10-CM | POA: Diagnosis present

## 2021-09-17 DIAGNOSIS — R570 Cardiogenic shock: Secondary | ICD-10-CM | POA: Diagnosis not present

## 2021-09-17 DIAGNOSIS — I5082 Biventricular heart failure: Secondary | ICD-10-CM | POA: Diagnosis not present

## 2021-09-17 DIAGNOSIS — R739 Hyperglycemia, unspecified: Secondary | ICD-10-CM | POA: Diagnosis not present

## 2021-09-17 DIAGNOSIS — I2510 Atherosclerotic heart disease of native coronary artery without angina pectoris: Secondary | ICD-10-CM | POA: Diagnosis present

## 2021-09-17 DIAGNOSIS — R531 Weakness: Secondary | ICD-10-CM | POA: Diagnosis not present

## 2021-09-17 DIAGNOSIS — Z79899 Other long term (current) drug therapy: Secondary | ICD-10-CM

## 2021-09-17 DIAGNOSIS — I4892 Unspecified atrial flutter: Secondary | ICD-10-CM | POA: Diagnosis not present

## 2021-09-17 DIAGNOSIS — Z888 Allergy status to other drugs, medicaments and biological substances status: Secondary | ICD-10-CM

## 2021-09-17 DIAGNOSIS — Z87891 Personal history of nicotine dependence: Secondary | ICD-10-CM | POA: Diagnosis not present

## 2021-09-17 DIAGNOSIS — I2111 ST elevation (STEMI) myocardial infarction involving right coronary artery: Principal | ICD-10-CM | POA: Diagnosis present

## 2021-09-17 DIAGNOSIS — Z7984 Long term (current) use of oral hypoglycemic drugs: Secondary | ICD-10-CM

## 2021-09-17 DIAGNOSIS — Z981 Arthrodesis status: Secondary | ICD-10-CM | POA: Diagnosis not present

## 2021-09-17 DIAGNOSIS — I110 Hypertensive heart disease with heart failure: Secondary | ICD-10-CM | POA: Diagnosis present

## 2021-09-17 DIAGNOSIS — Z452 Encounter for adjustment and management of vascular access device: Secondary | ICD-10-CM | POA: Diagnosis not present

## 2021-09-17 DIAGNOSIS — K7200 Acute and subacute hepatic failure without coma: Secondary | ICD-10-CM | POA: Diagnosis not present

## 2021-09-17 DIAGNOSIS — I480 Paroxysmal atrial fibrillation: Secondary | ICD-10-CM | POA: Diagnosis present

## 2021-09-17 DIAGNOSIS — R52 Pain, unspecified: Secondary | ICD-10-CM | POA: Diagnosis not present

## 2021-09-17 HISTORY — PX: LEFT HEART CATH AND CORONARY ANGIOGRAPHY: CATH118249

## 2021-09-17 HISTORY — PX: RIGHT HEART CATH: CATH118263

## 2021-09-17 HISTORY — DX: Type 2 diabetes mellitus without complications: E11.9

## 2021-09-17 HISTORY — DX: Essential (primary) hypertension: I10

## 2021-09-17 HISTORY — PX: CORONARY STENT INTERVENTION: CATH118234

## 2021-09-17 LAB — CBC WITH DIFFERENTIAL/PLATELET
Abs Immature Granulocytes: 0 10*3/uL (ref 0.00–0.07)
Basophils Absolute: 0 10*3/uL (ref 0.0–0.1)
Basophils Relative: 0 %
Eosinophils Absolute: 0 10*3/uL (ref 0.0–0.5)
Eosinophils Relative: 0 %
HCT: 37 % — ABNORMAL LOW (ref 39.0–52.0)
Hemoglobin: 12.8 g/dL — ABNORMAL LOW (ref 13.0–17.0)
Lymphocytes Relative: 6 %
Lymphs Abs: 1.4 10*3/uL (ref 0.7–4.0)
MCH: 31.5 pg (ref 26.0–34.0)
MCHC: 34.6 g/dL (ref 30.0–36.0)
MCV: 91.1 fL (ref 80.0–100.0)
Monocytes Absolute: 1.6 10*3/uL — ABNORMAL HIGH (ref 0.1–1.0)
Monocytes Relative: 7 %
Neutro Abs: 20.4 10*3/uL — ABNORMAL HIGH (ref 1.7–7.7)
Neutrophils Relative %: 87 %
Platelets: 411 10*3/uL — ABNORMAL HIGH (ref 150–400)
RBC: 4.06 MIL/uL — ABNORMAL LOW (ref 4.22–5.81)
RDW: 13.2 % (ref 11.5–15.5)
Smear Review: NORMAL
WBC: 23.5 10*3/uL — ABNORMAL HIGH (ref 4.0–10.5)
nRBC: 0 % (ref 0.0–0.2)

## 2021-09-17 LAB — POCT I-STAT 7, (LYTES, BLD GAS, ICA,H+H)
Acid-base deficit: 5 mmol/L — ABNORMAL HIGH (ref 0.0–2.0)
Acid-base deficit: 7 mmol/L — ABNORMAL HIGH (ref 0.0–2.0)
Acid-base deficit: 8 mmol/L — ABNORMAL HIGH (ref 0.0–2.0)
Bicarbonate: 16.3 mmol/L — ABNORMAL LOW (ref 20.0–28.0)
Bicarbonate: 18.6 mmol/L — ABNORMAL LOW (ref 20.0–28.0)
Bicarbonate: 20.5 mmol/L (ref 20.0–28.0)
Calcium, Ion: 1.09 mmol/L — ABNORMAL LOW (ref 1.15–1.40)
Calcium, Ion: 1.1 mmol/L — ABNORMAL LOW (ref 1.15–1.40)
Calcium, Ion: 1.11 mmol/L — ABNORMAL LOW (ref 1.15–1.40)
HCT: 33 % — ABNORMAL LOW (ref 39.0–52.0)
HCT: 33 % — ABNORMAL LOW (ref 39.0–52.0)
HCT: 34 % — ABNORMAL LOW (ref 39.0–52.0)
Hemoglobin: 11.2 g/dL — ABNORMAL LOW (ref 13.0–17.0)
Hemoglobin: 11.2 g/dL — ABNORMAL LOW (ref 13.0–17.0)
Hemoglobin: 11.6 g/dL — ABNORMAL LOW (ref 13.0–17.0)
O2 Saturation: 89 %
O2 Saturation: 99 %
O2 Saturation: 99 %
Patient temperature: 36.4
Potassium: 5.3 mmol/L — ABNORMAL HIGH (ref 3.5–5.1)
Potassium: 5.3 mmol/L — ABNORMAL HIGH (ref 3.5–5.1)
Potassium: 6.6 mmol/L (ref 3.5–5.1)
Sodium: 125 mmol/L — ABNORMAL LOW (ref 135–145)
Sodium: 125 mmol/L — ABNORMAL LOW (ref 135–145)
Sodium: 129 mmol/L — ABNORMAL LOW (ref 135–145)
TCO2: 17 mmol/L — ABNORMAL LOW (ref 22–32)
TCO2: 20 mmol/L — ABNORMAL LOW (ref 22–32)
TCO2: 22 mmol/L (ref 22–32)
pCO2 arterial: 27.5 mmHg — ABNORMAL LOW (ref 32–48)
pCO2 arterial: 38.1 mmHg (ref 32–48)
pCO2 arterial: 39 mmHg (ref 32–48)
pH, Arterial: 7.296 — ABNORMAL LOW (ref 7.35–7.45)
pH, Arterial: 7.328 — ABNORMAL LOW (ref 7.35–7.45)
pH, Arterial: 7.379 (ref 7.35–7.45)
pO2, Arterial: 123 mmHg — ABNORMAL HIGH (ref 83–108)
pO2, Arterial: 140 mmHg — ABNORMAL HIGH (ref 83–108)
pO2, Arterial: 61 mmHg — ABNORMAL LOW (ref 83–108)

## 2021-09-17 LAB — POCT I-STAT EG7
Acid-base deficit: 5 mmol/L — ABNORMAL HIGH (ref 0.0–2.0)
Bicarbonate: 20.9 mmol/L (ref 20.0–28.0)
Calcium, Ion: 1.11 mmol/L — ABNORMAL LOW (ref 1.15–1.40)
HCT: 34 % — ABNORMAL LOW (ref 39.0–52.0)
Hemoglobin: 11.6 g/dL — ABNORMAL LOW (ref 13.0–17.0)
O2 Saturation: 50 %
Potassium: 5.5 mmol/L — ABNORMAL HIGH (ref 3.5–5.1)
Sodium: 128 mmol/L — ABNORMAL LOW (ref 135–145)
TCO2: 22 mmol/L (ref 22–32)
pCO2, Ven: 41.2 mmHg — ABNORMAL LOW (ref 44–60)
pH, Ven: 7.313 (ref 7.25–7.43)
pO2, Ven: 29 mmHg — CL (ref 32–45)

## 2021-09-17 LAB — APTT: aPTT: 24 seconds (ref 24–36)

## 2021-09-17 LAB — RESP PANEL BY RT-PCR (FLU A&B, COVID) ARPGX2
Influenza A by PCR: NEGATIVE
Influenza B by PCR: NEGATIVE
SARS Coronavirus 2 by RT PCR: NEGATIVE

## 2021-09-17 LAB — LIPID PANEL
Cholesterol: 148 mg/dL (ref 0–200)
HDL: 34 mg/dL — ABNORMAL LOW (ref >40)
LDL Cholesterol: 91 mg/dL (ref 0–99)
Total CHOL/HDL Ratio: 4.4 RATIO
Triglycerides: 113 mg/dL (ref <150)
VLDL: 23 mg/dL (ref 0–40)

## 2021-09-17 LAB — COMPREHENSIVE METABOLIC PANEL
ALT: 78 U/L — ABNORMAL HIGH (ref 0–44)
AST: 268 U/L — ABNORMAL HIGH (ref 15–41)
Albumin: 3.9 g/dL (ref 3.5–5.0)
Alkaline Phosphatase: 85 U/L (ref 38–126)
Anion gap: 11 (ref 5–15)
BUN: 45 mg/dL — ABNORMAL HIGH (ref 8–23)
CO2: 23 mmol/L (ref 22–32)
Calcium: 8.9 mg/dL (ref 8.9–10.3)
Chloride: 93 mmol/L — ABNORMAL LOW (ref 98–111)
Creatinine, Ser: 1.64 mg/dL — ABNORMAL HIGH (ref 0.61–1.24)
GFR, Estimated: 42 mL/min — ABNORMAL LOW (ref >60)
Glucose, Bld: 456 mg/dL — ABNORMAL HIGH (ref 70–99)
Potassium: 5.8 mmol/L — ABNORMAL HIGH (ref 3.5–5.1)
Sodium: 127 mmol/L — ABNORMAL LOW (ref 135–145)
Total Bilirubin: 1 mg/dL (ref 0.3–1.2)
Total Protein: 7.1 g/dL (ref 6.5–8.1)

## 2021-09-17 LAB — GLUCOSE, CAPILLARY
Glucose-Capillary: 436 mg/dL — ABNORMAL HIGH (ref 70–99)
Glucose-Capillary: 484 mg/dL — ABNORMAL HIGH (ref 70–99)

## 2021-09-17 LAB — PROTIME-INR
INR: 1.1 (ref 0.8–1.2)
Prothrombin Time: 14.4 seconds (ref 11.4–15.2)

## 2021-09-17 LAB — HEMOGLOBIN A1C
Hgb A1c MFr Bld: 9.5 % — ABNORMAL HIGH (ref 4.8–5.6)
Mean Plasma Glucose: 225.95 mg/dL

## 2021-09-17 LAB — CBG MONITORING, ED: Glucose-Capillary: 385 mg/dL — ABNORMAL HIGH (ref 70–99)

## 2021-09-17 LAB — POCT ACTIVATED CLOTTING TIME
Activated Clotting Time: 305 seconds
Activated Clotting Time: 191 seconds

## 2021-09-17 LAB — TROPONIN I (HIGH SENSITIVITY): Troponin I (High Sensitivity): >24000 ng/L (ref <18)

## 2021-09-17 SURGERY — LEFT HEART CATH AND CORONARY ANGIOGRAPHY
Anesthesia: LOCAL

## 2021-09-17 MED ORDER — HEPARIN (PORCINE) IN NACL 1000-0.9 UT/500ML-% IV SOLN
INTRAVENOUS | Status: DC | PRN
  Administered 2021-09-17 (×2): 500 mL

## 2021-09-17 MED ORDER — SODIUM CHLORIDE 0.9 % IV BOLUS
500.0000 mL | Freq: Once | INTRAVENOUS | Status: AC
  Administered 2021-09-17: 500 mL via INTRAVENOUS

## 2021-09-17 MED ORDER — ASPIRIN 81 MG PO CHEW
324.0000 mg | CHEWABLE_TABLET | Freq: Once | ORAL | Status: AC
Start: 2021-09-17 — End: 2021-09-17
  Administered 2021-09-17: 324 mg via ORAL
  Filled 2021-09-17: qty 4

## 2021-09-17 MED ORDER — VERAPAMIL HCL 2.5 MG/ML IV SOLN
INTRAVENOUS | Status: AC
Start: 2021-09-17 — End: ?
  Filled 2021-09-17: qty 2

## 2021-09-17 MED ORDER — TIROFIBAN HCL IN NACL 5-0.9 MG/100ML-% IV SOLN
INTRAVENOUS | Status: AC
Start: 2021-09-17 — End: ?
  Filled 2021-09-17: qty 100

## 2021-09-17 MED ORDER — INSULIN ASPART 100 UNIT/ML IJ SOLN: 0.0000 [IU] | Freq: Three times a day (TID) | INTRAMUSCULAR | Status: DC

## 2021-09-17 MED ORDER — HEPARIN SODIUM (PORCINE) 5000 UNIT/ML IJ SOLN
4000.0000 [IU] | Freq: Once | INTRAMUSCULAR | Status: AC
Start: 2021-09-17 — End: 2021-09-17
  Administered 2021-09-17: 4000 [IU] via INTRAVENOUS
  Filled 2021-09-17: qty 1

## 2021-09-17 MED ORDER — SODIUM CHLORIDE 0.9 % IV SOLN: INTRAVENOUS | Status: DC

## 2021-09-17 MED ORDER — LIDOCAINE HCL (PF) 1 % IJ SOLN
INTRAMUSCULAR | Status: AC
  Filled 2021-09-17: qty 30

## 2021-09-17 MED ORDER — INSULIN DETEMIR 100 UNIT/ML ~~LOC~~ SOLN
10.0000 [IU] | Freq: Every day | SUBCUTANEOUS | Status: DC
  Administered 2021-09-17: 10 [IU] via SUBCUTANEOUS
  Filled 2021-09-17 (×2): qty 0.1

## 2021-09-17 MED ORDER — FENTANYL CITRATE (PF) 100 MCG/2ML IJ SOLN
INTRAMUSCULAR | Status: AC
  Filled 2021-09-17: qty 2

## 2021-09-17 MED ORDER — CHLORHEXIDINE GLUCONATE CLOTH 2 % EX PADS
6.0000 | MEDICATED_PAD | Freq: Every day | CUTANEOUS | Status: DC
  Administered 2021-09-17 – 2021-09-18 (×2): 6 via TOPICAL

## 2021-09-17 MED ORDER — SODIUM CHLORIDE 0.9 % IV SOLN
INTRAVENOUS | Status: AC | PRN
  Administered 2021-09-17: 50 mL/h via INTRAVENOUS

## 2021-09-17 MED ORDER — DOPAMINE-DEXTROSE 3.2-5 MG/ML-% IV SOLN
INTRAVENOUS | Status: AC
  Filled 2021-09-17: qty 250

## 2021-09-17 MED ORDER — IOHEXOL 350 MG/ML SOLN
INTRAVENOUS | Status: DC | PRN
  Administered 2021-09-17: 180 mL via INTRA_ARTERIAL

## 2021-09-17 MED ORDER — DOPAMINE-DEXTROSE 3.2-5 MG/ML-% IV SOLN
INTRAVENOUS | Status: DC | PRN
  Administered 2021-09-17: 5 ug/kg/min via INTRAVENOUS

## 2021-09-17 MED ORDER — MIDAZOLAM HCL 2 MG/2ML IJ SOLN
INTRAMUSCULAR | Status: DC | PRN
  Administered 2021-09-17: 1 mg via INTRAVENOUS

## 2021-09-17 MED ORDER — VERAPAMIL HCL 2.5 MG/ML IV SOLN
INTRAVENOUS | Status: DC | PRN
  Administered 2021-09-17: 10 mL via INTRA_ARTERIAL

## 2021-09-17 MED ORDER — HEPARIN SODIUM (PORCINE) 1000 UNIT/ML IJ SOLN
INTRAMUSCULAR | Status: DC | PRN
  Administered 2021-09-17 (×2): 4000 [IU] via INTRAVENOUS

## 2021-09-17 MED ORDER — TIROFIBAN HCL IN NACL 5-0.9 MG/100ML-% IV SOLN
INTRAVENOUS | Status: AC | PRN
  Administered 2021-09-17: .075 ug/kg/min via INTRAVENOUS

## 2021-09-17 MED ORDER — FENTANYL CITRATE (PF) 100 MCG/2ML IJ SOLN
INTRAMUSCULAR | Status: DC | PRN
  Administered 2021-09-17: 25 ug via INTRAVENOUS

## 2021-09-17 MED ORDER — ONDANSETRON HCL 4 MG/2ML IJ SOLN
INTRAMUSCULAR | Status: AC
  Filled 2021-09-17: qty 2

## 2021-09-17 MED ORDER — MIDAZOLAM HCL 2 MG/2ML IJ SOLN
INTRAMUSCULAR | Status: AC
  Filled 2021-09-17: qty 2

## 2021-09-17 MED ORDER — TIROFIBAN (AGGRASTAT) BOLUS VIA INFUSION
INTRAVENOUS | Status: DC | PRN
  Administered 2021-09-17: 1700 ug via INTRAVENOUS

## 2021-09-17 MED ORDER — VERAPAMIL HCL 2.5 MG/ML IV SOLN
INTRAVENOUS | Status: DC | PRN
  Administered 2021-09-17: 300 ug via INTRACORONARY
  Administered 2021-09-17: 200 ug via INTRACORONARY
  Administered 2021-09-17: 200 ug

## 2021-09-17 MED ORDER — LIDOCAINE HCL (PF) 1 % IJ SOLN
INTRAMUSCULAR | Status: DC | PRN
  Administered 2021-09-17: 2 mL
  Administered 2021-09-17 (×2): 3 mL

## 2021-09-17 MED ORDER — HEPARIN (PORCINE) IN NACL 1000-0.9 UT/500ML-% IV SOLN
INTRAVENOUS | Status: AC
  Filled 2021-09-17: qty 1000

## 2021-09-17 MED ORDER — TICAGRELOR 90 MG PO TABS
90.0000 mg | ORAL_TABLET | Freq: Two times a day (BID) | ORAL | Status: DC
Start: 2021-09-18 — End: 2021-09-23
  Administered 2021-09-18 – 2021-09-22 (×10): 90 mg via ORAL
  Filled 2021-09-17 (×10): qty 1

## 2021-09-17 MED ORDER — NOREPINEPHRINE 4 MG/250ML-% IV SOLN
0.0000 ug/min | INTRAVENOUS | Status: DC
  Filled 2021-09-17: qty 250

## 2021-09-17 MED ORDER — TICAGRELOR 90 MG PO TABS
180.0000 mg | ORAL_TABLET | Freq: Once | ORAL | Status: AC
  Administered 2021-09-17: 180 mg via ORAL
  Filled 2021-09-17: qty 2

## 2021-09-17 MED ORDER — INSULIN ASPART 100 UNIT/ML IJ SOLN
0.0000 [IU] | Freq: Three times a day (TID) | INTRAMUSCULAR | Status: DC
  Administered 2021-09-17: 15 [IU] via SUBCUTANEOUS
  Administered 2021-09-18: 2 [IU] via SUBCUTANEOUS
  Administered 2021-09-18: 5 [IU] via SUBCUTANEOUS
  Administered 2021-09-18 – 2021-09-19 (×2): 2 [IU] via SUBCUTANEOUS
  Administered 2021-09-19: 8 [IU] via SUBCUTANEOUS
  Administered 2021-09-19 – 2021-09-20 (×2): 5 [IU] via SUBCUTANEOUS
  Administered 2021-09-20: 3 [IU] via SUBCUTANEOUS
  Administered 2021-09-21: 8 [IU] via SUBCUTANEOUS
  Administered 2021-09-21: 5 [IU] via SUBCUTANEOUS
  Administered 2021-09-21: 3 [IU] via SUBCUTANEOUS
  Administered 2021-09-22: 5 [IU] via SUBCUTANEOUS
  Administered 2021-09-22 (×2): 3 [IU] via SUBCUTANEOUS
  Administered 2021-09-23: 2 [IU] via SUBCUTANEOUS
  Administered 2021-09-23: 8 [IU] via SUBCUTANEOUS
  Administered 2021-09-23 – 2021-09-24 (×2): 3 [IU] via SUBCUTANEOUS
  Administered 2021-09-25: 11 [IU] via SUBCUTANEOUS

## 2021-09-17 MED ORDER — ONDANSETRON HCL 4 MG/2ML IJ SOLN
INTRAMUSCULAR | Status: DC | PRN
  Administered 2021-09-17: 4 mg via INTRAVENOUS

## 2021-09-17 MED ORDER — ASPIRIN 81 MG PO CHEW
81.0000 mg | CHEWABLE_TABLET | Freq: Every day | ORAL | Status: DC
  Administered 2021-09-18 – 2021-09-22 (×5): 81 mg via ORAL
  Filled 2021-09-17 (×5): qty 1

## 2021-09-17 MED ORDER — NOREPINEPHRINE BITARTRATE 1 MG/ML IV SOLN
INTRAVENOUS | Status: AC | PRN
  Administered 2021-09-17: 5 ug/min via INTRAVENOUS

## 2021-09-17 MED ORDER — SODIUM CHLORIDE 0.9 % IV BOLUS
500.0000 mL | Freq: Once | INTRAVENOUS | Status: DC
Start: 2021-09-17 — End: 2021-09-25

## 2021-09-17 SURGICAL SUPPLY — 23 items
BALLN SAPPHIRE 2.5X15 (BALLOONS) ×1
BALLOON SAPPHIRE 2.5X15 (BALLOONS) IMPLANT
BAND CMPR LRG ZPHR (HEMOSTASIS) ×1
BAND ZEPHYR COMPRESS 30 LONG (HEMOSTASIS) IMPLANT
CATH 5FR JL3.5 JR4 ANG PIG MP (CATHETERS) IMPLANT
CATH EXTRAC PRONTO LP 6F RND (CATHETERS) IMPLANT
CATH SWAN GANZ VIP 7.5F (CATHETERS) IMPLANT
CATH VISTA GUIDE 6FR JR4 (CATHETERS) IMPLANT
GLIDESHEATH SLEND SS 6F .021 (SHEATH) IMPLANT
GUIDEWIRE INQWIRE 1.5J.035X260 (WIRE) IMPLANT
INQWIRE 1.5J .035X260CM (WIRE) ×1
KIT ENCORE 26 ADVANTAGE (KITS) IMPLANT
KIT HEART LEFT (KITS) ×1 IMPLANT
KIT MICROPUNCTURE NIT STIFF (SHEATH) IMPLANT
PACK CARDIAC CATHETERIZATION (CUSTOM PROCEDURE TRAY) ×1 IMPLANT
SHEATH PINNACLE 8F 10CM (SHEATH) IMPLANT
SHEATH PROBE COVER 6X72 (BAG) IMPLANT
SLEEVE REPOSITIONING LENGTH 30 (MISCELLANEOUS) IMPLANT
STENT SYNERGY XD 3.50X24 (Permanent Stent) IMPLANT
SYNERGY XD 3.50X24 (Permanent Stent) ×1 IMPLANT
TRANSDUCER W/STOPCOCK (MISCELLANEOUS) ×1 IMPLANT
TUBING CIL FLEX 10 FLL-RA (TUBING) ×1 IMPLANT
WIRE COUGAR XT STRL 190CM (WIRE) IMPLANT

## 2021-09-17 NOTE — H&P (Signed)
Cardiology Admission History and Physical   Patient ID: Tommy Graham MRN: [id number]; DOB: [birth year]   Admission date: 09/17/2021  PCP:  Mann, Benjamin L, [place] Providers Cardiologist:  None        Chief Complaint: Chest pain  Patient Profile:   Tommy Graham is a 81 y.o. male with history of diabetes who is being seen 09/17/2021 for the evaluation of chest pain/STEMI.  History of Present Illness:   Tommy Graham is an 81 year old gentleman with background history of diabetes, hypertension, and mixed hyperlipidemia.  He developed chest discomfort today and had associated symptoms of lower abdominal cramping, near syncope, and diaphoresis.  With ongoing chest discomfort he went to the emergency department at [facility] and was diagnosed with an inferior STEMI.  He is transferred emergently to [facility] for cardiac catheterization and primary PCI.  He is treated at [facility] with IV heparin, aspirin, and ticagrelor.  The patient is interviewed on arrival to the cardiac catheterization lab.  He was out doing yard work all day yesterday.  States that he felt really bad after that with developing nausea, weakness, and diaphoresis.  He had some lower abdominal discomfort that this morning moved into his chest and he has had unrelenting chest discomfort today.  He eventually presented to [facility] as outlined above.  He continues to have 7/10 chest pain at the time of my evaluation today.  No other associated symptoms at this time.   Past Medical History:  Diagnosis Date   Cancer [facility])    prostate   Diabetes mellitus without complication ([place])    Hypertension     Past Surgical History:  Procedure Laterality Date   CERVICAL SPINE SURGERY     PROSTATE SURGERY     ROTATOR CUFF REPAIR Bilateral      Medications Prior to Admission: Prior to Admission medications   Medication Sig Start Date End Date Taking? Authorizing Provider   amLODipine (NORVASC) 5 MG tablet Take 10 mg by mouth daily.    [provider]  dorzolamide (TRUSOPT) 2 % ophthalmic solution Place 1 drop into both eyes in the morning. 12/23/20   [provider]  DULoxetine (CYMBALTA) 30 MG capsule Take 30 mg by mouth daily.    [provider]  escitalopram (LEXAPRO) 10 MG tablet Take 10 mg by mouth daily.    [provider]  HYDROcodone-acetaminophen (NORCO/VICODIN) 5-325 MG tablet Take 1 tablet by mouth every 6 (six) hours as needed. 02/02/21   Milton Ferguson, MD  insulin glargine (LANTUS) 100 UNIT/ML injection Inject 20 Units into the skin at bedtime.    [provider]  latanoprost (XALATAN) 0.005 % ophthalmic solution Place 1 drop into both eyes at bedtime. 12/23/20   [provider]  lisinopril (ZESTRIL) 20 MG tablet Take 20 mg by mouth daily.    [provider]  metFORMIN (GLUCOPHAGE) 850 MG tablet Take 850 mg by mouth in the morning and at bedtime.    [provider]  propranolol (INDERAL) 10 MG tablet Take 10 mg by mouth in the morning, at noon, and at bedtime.    [provider]     Allergies:    Allergies  Allergen Reactions   Diphenhydramine Hcl    Nifedipine     Social History:   Social History   Socioeconomic History   Marital status: Married    Spouse name: Not on file   Number of children:  Not on file   Years of education: Not on file   Highest education level: Not on file  Occupational History   Not on file  Tobacco Use   Smoking status: Never   Smokeless tobacco: Never  Vaping Use   Vaping Use: Never used  Substance and Sexual Activity   Alcohol use: Never   Drug use: Never   Sexual activity: Not on file  Other Topics Concern   Not on file  Social History Narrative   Not on file   Social Determinants of Health   Financial Resource Strain: Not on file  Food Insecurity: Not on file  Transportation Needs: Not on file  Physical Activity:  Not on file  Stress: Not on file  Social Connections: Not on file  Intimate Partner Violence: Not on file    Family History:   The patient's family history is not on file.   The patient's mother died of a "massive stroke" at age 71.  His father died at 57 of lung related problems.  No premature CAD in the family.  ROS:  Please see the history of present illness.  All other ROS reviewed and negative.     Physical Exam/Data:   Vitals:   09/17/21 1845 09/17/21 1851 09/17/21 1900  BP:  126/79 (!) 126/98  Pulse:  79 76  Resp:  19 20  SpO2:  100% 100%  Weight: 68 kg    Height: '5\' 8"'$  (1.727 m)     No intake or output data in the 24 hours ending 09/17/21 1957    09/17/2021    6:45 PM 02/02/2021   11:13 AM 02/02/2006    9:02 AM  Last 3 Weights  Weight (lbs) 150 lb 144 lb 15.9 oz 166 lb  Weight (kg) 68.04 kg 65.77 kg 75.297 kg     Body mass index is 22.81 kg/m.  General:  Well nourished, well developed, pleasant elderly male in no acute distress HEENT: normal Neck: no JVD Vascular: No carotid bruits; Distal pulses 2+ bilaterally   Cardiac:  normal S1, S2; RRR; no murmur  Lungs:  clear to auscultation bilaterally, no wheezing, rhonchi or rales  Abd: soft, nontender, no hepatomegaly  Ext: no edema Musculoskeletal:  No deformities, BUE and BLE strength normal and equal Skin: warm and dry  Neuro:  CNs 2-12 intact, no focal abnormalities noted Psych:  Normal affect    EKG:  The ECG that was done at [place] today was personally reviewed and demonstrates normal sinus rhythm with acute inferolateral STEMI pattern  Relevant CV Studies: Pending  Laboratory Data:  High Sensitivity Troponin:  No results for input(s): "TROPONINIHS" in the last 720 hours.    ChemistryNo results for input(s): "NA", "K", "CL", "CO2", "GLUCOSE", "BUN", "CREATININE", "CALCIUM", "MG", "GFRNONAA", "GFRAA", "ANIONGAP" in the last 168 hours.  No results for input(s): "PROT", "ALBUMIN", "AST", "ALT", "ALKPHOS",  "BILITOT" in the last 168 hours. Lipids  Recent Labs  Lab 09/17/21 1858  CHOL 148  TRIG 113  HDL 34*  LDLCALC 91  CHOLHDL 4.4   Hematology Recent Labs  Lab 09/17/21 1858  WBC 23.5*  RBC 4.06*  HGB 12.8*  HCT 37.0*  MCV 91.1  MCH 31.5  MCHC 34.6  RDW 13.2  PLT 411*   Thyroid No results for input(s): "TSH", "FREET4" in the last 168 hours. BNPNo results for input(s): "BNP", "PROBNP" in the last 168 hours.  DDimer No results for input(s): "DDIMER" in the last 168 hours.   Radiology/Studies:  DG Chest Port 1 View  Result Date: 09/17/2021 CLINICAL DATA:  Dizziness. EXAM: PORTABLE CHEST 1 VIEW COMPARISON:  December 26, 2014 FINDINGS: The heart size and mediastinal contours are within normal limits. There is no evidence of acute infiltrate, pleural effusion or pneumothorax. A radiopaque fusion plate and screws are seen overlying the lower cervical spine. Multilevel degenerative changes seen throughout the thoracic spine. IMPRESSION: No active cardiopulmonary disease. Electronically Signed   By: Virgina Norfolk M.D.   On: 09/17/2021 19:24     Assessment and Plan:   Inferolateral STEMI Type 2 diabetes Hypertension Mixed hyperlipidemia  The patient arrives at the cardiac catheterization lab for emergency catheterization with anticipated angioplasty and stenting (primary PCI).  He appears clinically stable with exam outlined above, 2+ radial pulse and 2+ right femoral pulse.  He has received heparin and ticagrelor loading prior to arrival after discussion with the emergency department physician.  Further plans/disposition pending his cardiac catheterization and PCI results.  He will require aggressive secondary risk reduction for treatment of his underlying diabetes, hypertension, and mixed hyperlipidemia.   Risk Assessment/Risk Scores:    TIMI Risk Score for ST  Elevation MI:   The patient's TIMI risk score is 4, which indicates a 7.3% risk of all cause mortality at 30 days.         Severity of Illness: The appropriate patient status for this patient is INPATIENT. Inpatient status is judged to be reasonable and necessary in order to provide the required intensity of service to ensure the patient's safety. The patient's presenting symptoms, physical exam findings, and initial radiographic and laboratory data in the context of their chronic comorbidities is felt to place them at high risk for further clinical deterioration. Furthermore, it is not anticipated that the patient will be medically stable for discharge from the hospital within 2 midnights of admission.   * I certify that at the point of admission it is my clinical judgment that the patient will require inpatient hospital care spanning beyond 2 midnights from the point of admission due to high intensity of service, high risk for further deterioration and high frequency of surveillance required.*   For questions or updates, please contact [place] Please consult [email or web address] for contact info under     Signed, Sherren Mocha, MD  09/17/2021 7:57 PM

## 2021-09-17 NOTE — ED Notes (Signed)
EDP discussing plan of care with patient. Pt Tommy Graham

## 2021-09-17 NOTE — ED Triage Notes (Signed)
Pt presents to ED, states yesterday he was working on the lawnmower, when he finished about 1400 yesterday he felt like his sugar had dropped and started feeling dizzy and felt like he was going to black out. Pt states last night he started having sharp abdominal pain up into his throat, making it hard to lay down. Pt states his sugars have been running high since, unsteady gait with walking, hurting more today in his chest feels like a "hard pain"

## 2021-09-17 NOTE — ED Notes (Signed)
ED Provider at bedside. 

## 2021-09-17 NOTE — ED Notes (Signed)
Pt placed on Zoll pads. 

## 2021-09-17 NOTE — ED Provider Notes (Signed)
[facility] EMERGENCY DEPARTMENT Provider Note   CSN: [id number] Arrival date & time: 09/17/21  1828     History  Chief Complaint  Patient presents with   Chest Pain    Tommy Graham is a 81 y.o. male.  HPI 81 year old male presents with chest pain.  Symptoms started yesterday with dizziness while he is out in the yard for multiple hours.  Thought he was getting dehydrated and his sugar might be dropping.  He felt like he might pass out.  Started to get some lower abdominal cramping that has then gone away.  He drink some Pedialyte today and pain start going into his chest around 12 or 2 PM.  Now he is having chest pain that is persistent.  Some shortness of breath though he chronically has dyspnea.  No leg swelling or new back pain.  Multiple comorbidities including hypertension, hyperlipidemia, diabetes.  Remote history of smoking.  Home Medications Prior to Admission medications   Medication Sig Start Date End Date Taking? Authorizing Provider  amLODipine (NORVASC) 5 MG tablet Take 10 mg by mouth daily.    [provider]  dorzolamide (TRUSOPT) 2 % ophthalmic solution Place 1 drop into both eyes in the morning. 12/23/20   [provider]  DULoxetine (CYMBALTA) 30 MG capsule Take 30 mg by mouth daily.    [provider]  escitalopram (LEXAPRO) 10 MG tablet Take 10 mg by mouth daily.    [provider]  HYDROcodone-acetaminophen (NORCO/VICODIN) 5-325 MG tablet Take 1 tablet by mouth every 6 (six) hours as needed. 02/02/21   Milton Ferguson, MD  insulin glargine (LANTUS) 100 UNIT/ML injection Inject 20 Units into the skin at bedtime.    [provider]  latanoprost (XALATAN) 0.005 % ophthalmic solution Place 1 drop into both eyes at bedtime. 12/23/20   [provider]  lisinopril (ZESTRIL) 20 MG tablet Take 20 mg by mouth daily.    [provider]  metFORMIN (GLUCOPHAGE) 850 MG tablet Take 850 mg by mouth in the morning and  at bedtime.    [provider]  propranolol (INDERAL) 10 MG tablet Take 10 mg by mouth in the morning, at noon, and at bedtime.    [provider]      Allergies    Diphenhydramine hcl and Nifedipine    Review of Systems   Review of Systems  Respiratory:  Positive for shortness of breath.   Cardiovascular:  Positive for chest pain.  Gastrointestinal:  Positive for abdominal pain.  Musculoskeletal:  Negative for back pain.  Neurological:  Positive for light-headedness.    Physical Exam Updated Vital Signs BP (!) 126/98   Pulse 76   Resp 20   Ht '5\' 8"'$  (1.727 m)   Wt 68 kg   SpO2 100%   BMI 22.81 kg/m  Physical Exam Vitals and nursing note reviewed.  Constitutional:      General: He is not in acute distress.    Appearance: He is well-developed. He is not ill-appearing or diaphoretic.  HENT:     Head: Normocephalic and atraumatic.  Cardiovascular:     Rate and Rhythm: Normal rate and regular rhythm.     Pulses:          Radial pulses are 2+ on the right side.     Heart sounds: Normal heart sounds.  Pulmonary:     Effort: Pulmonary effort is normal.     Breath sounds: Normal breath sounds.  Abdominal:  Palpations: Abdomen is soft.     Tenderness: There is no abdominal tenderness.  Skin:    General: Skin is warm and dry.  Neurological:     Mental Status: He is alert.     ED Results / Procedures / Treatments   Labs (all labs ordered are listed, but only abnormal results are displayed) Labs Reviewed  CBC WITH DIFFERENTIAL/PLATELET - Abnormal; Notable for the following components:      Result Value   WBC 23.5 (*)    RBC 4.06 (*)    Hemoglobin 12.8 (*)    HCT 37.0 (*)    Platelets 411 (*)    Neutro Abs 20.4 (*)    Monocytes Absolute 1.6 (*)    All other components within normal limits  CBG MONITORING, ED - Abnormal; Notable for the following components:   Glucose-Capillary 385 (*)    All other components within normal limits  RESP PANEL  BY RT-PCR (FLU A&B, COVID) ARPGX2  PROTIME-INR  APTT  HEMOGLOBIN A1C  COMPREHENSIVE METABOLIC PANEL  LIPID PANEL  TROPONIN I (HIGH SENSITIVITY)    EKG EKG Interpretation  Date/Time:  Thursday September 17 2021 19:09:22 EDT Ventricular Rate:  76 PR Interval:  162 QRS Duration: 93 QT Interval:  370 QTC Calculation: 416 R Axis:   69 Text Interpretation: Sinus rhythm Inferior infarct, acute (LCx) Lateral leads are also involved >>> Acute MI <<< Confirmed by Sherwood Gambler [phone]) on 09/17/2021 7:10:44 PM  Radiology DG Chest Port 1 View  Result Date: 09/17/2021 CLINICAL DATA:  Dizziness. EXAM: PORTABLE CHEST 1 VIEW COMPARISON:  December 26, 2014 FINDINGS: The heart size and mediastinal contours are within normal limits. There is no evidence of acute infiltrate, pleural effusion or pneumothorax. A radiopaque fusion plate and screws are seen overlying the lower cervical spine. Multilevel degenerative changes seen throughout the thoracic spine. IMPRESSION: No active cardiopulmonary disease. Electronically Signed   By: Virgina Norfolk M.D.   On: 09/17/2021 19:24    Procedures Procedures    Medications Ordered in ED Medications  0.9 %  sodium chloride infusion (has no administration in time range)  sodium chloride 0.9 % bolus 500 mL (500 mLs Intravenous New Bag/Given 09/17/21 1903)  aspirin chewable tablet 324 mg (324 mg Oral Given 09/17/21 1859)  heparin injection 4,000 Units (4,000 Units Intravenous Given 09/17/21 1900)  ticagrelor (BRILINTA) tablet 180 mg (180 mg Oral Given 09/17/21 1900)    ED Course/ Medical Decision Making/ A&P Clinical Course as of 09/17/21 1937  Thu Sep 17, 2021  1856 I discussed with Dr. Burt Knack of cardiology.  He recommends given there is going to be a delay as he is transferring from [facility] to give 180 mg Brilinta in addition to the aspirin and heparin.  Patient is otherwise well-appearing and has stable vital signs. [SG]    Clinical Course User Index [SG]  Sherwood Gambler, MD                           Medical Decision Making Amount and/or Complexity of Data Reviewed Labs: ordered. Radiology: ordered.  Risk OTC drugs. Prescription drug management.   Patient is not in acute distress here but does have chest pain that is not reproducible.  He reports abdominal pain yesterday but his exam is benign.  ECG clearly shows STEMI.  As above I discussed with Dr. Burt Knack and we will transfer emergently via [facility] to [facility] for Cath Lab.  He was  given heparin, aspirin, and Brilinta as above.  We will give a small bolus of fluids given concern for dehydration from being outside yesterday.  Otherwise, x-ray images viewed by myself and on my interpretation there is no pneumothorax or mediastinal widening.  He is stable appearing on transfer.        Final Clinical Impression(s) / ED Diagnoses Final diagnoses:  ST elevation myocardial infarction (STEMI), unspecified artery [facility])    Rx / DC Orders ED Discharge Orders     None         Sherwood Gambler, MD 09/17/21 [phone]

## 2021-09-17 NOTE — ED Notes (Signed)
Pt belongings and wedding ring given to wife.

## 2021-09-18 ENCOUNTER — Inpatient Hospital Stay (HOSPITAL_COMMUNITY): Payer: PPO

## 2021-09-18 ENCOUNTER — Other Ambulatory Visit (HOSPITAL_COMMUNITY): Payer: Self-pay

## 2021-09-18 ENCOUNTER — Encounter (HOSPITAL_COMMUNITY): Payer: Self-pay | Admitting: Cardiovascular Disease

## 2021-09-18 ENCOUNTER — Telehealth (HOSPITAL_COMMUNITY): Payer: Self-pay | Admitting: Pharmacy Technician

## 2021-09-18 DIAGNOSIS — I5021 Acute systolic (congestive) heart failure: Secondary | ICD-10-CM | POA: Diagnosis not present

## 2021-09-18 DIAGNOSIS — I2111 ST elevation (STEMI) myocardial infarction involving right coronary artery: Secondary | ICD-10-CM | POA: Diagnosis not present

## 2021-09-18 LAB — COOXEMETRY PANEL
Carboxyhemoglobin: 0.8 % (ref 0.5–1.5)
Carboxyhemoglobin: 0.7 % (ref 0.5–1.5)
Carboxyhemoglobin: 1 % (ref 0.5–1.5)
Carboxyhemoglobin: 0.8 % (ref 0.5–1.5)
Methemoglobin: <0.7 % (ref 0.0–1.5)
Methemoglobin: <0.7 % (ref 0.0–1.5)
Methemoglobin: 0.7 % (ref 0.0–1.5)
Methemoglobin: 0.8 % (ref 0.0–1.5)
O2 Saturation: 98.7 %
O2 Saturation: 38.6 %
O2 Saturation: 49.6 %
O2 Saturation: 68.2 %
Total hemoglobin: 12.3 g/dL (ref 12.0–16.0)
Total hemoglobin: 12.4 g/dL (ref 12.0–16.0)
Total hemoglobin: 11.6 g/dL — ABNORMAL LOW (ref 12.0–16.0)
Total hemoglobin: 11.8 g/dL — ABNORMAL LOW (ref 12.0–16.0)

## 2021-09-18 LAB — GLUCOSE, CAPILLARY
Glucose-Capillary: 427 mg/dL — ABNORMAL HIGH (ref 70–99)
Glucose-Capillary: 202 mg/dL — ABNORMAL HIGH (ref 70–99)
Glucose-Capillary: 239 mg/dL — ABNORMAL HIGH (ref 70–99)
Glucose-Capillary: 245 mg/dL — ABNORMAL HIGH (ref 70–99)
Glucose-Capillary: 137 mg/dL — ABNORMAL HIGH (ref 70–99)
Glucose-Capillary: 51 mg/dL — ABNORMAL LOW (ref 70–99)
Glucose-Capillary: 57 mg/dL — ABNORMAL LOW (ref 70–99)
Glucose-Capillary: 87 mg/dL (ref 70–99)
Glucose-Capillary: 140 mg/dL — ABNORMAL HIGH (ref 70–99)
Glucose-Capillary: 126 mg/dL — ABNORMAL HIGH (ref 70–99)

## 2021-09-18 LAB — HEPATIC FUNCTION PANEL
ALT: 106 U/L — ABNORMAL HIGH (ref 0–44)
AST: 328 U/L — ABNORMAL HIGH (ref 15–41)
Albumin: 2.8 g/dL — ABNORMAL LOW (ref 3.5–5.0)
Alkaline Phosphatase: 65 U/L (ref 38–126)
Bilirubin, Direct: 0.1 mg/dL (ref 0.0–0.2)
Indirect Bilirubin: 0.5 mg/dL (ref 0.3–0.9)
Total Bilirubin: 0.6 mg/dL (ref 0.3–1.2)
Total Protein: 5.5 g/dL — ABNORMAL LOW (ref 6.5–8.1)

## 2021-09-18 LAB — POCT I-STAT 7, (LYTES, BLD GAS, ICA,H+H)
Acid-base deficit: 5 mmol/L — ABNORMAL HIGH (ref 0.0–2.0)
Bicarbonate: 18.6 mmol/L — ABNORMAL LOW (ref 20.0–28.0)
Calcium, Ion: 1.19 mmol/L (ref 1.15–1.40)
HCT: 35 % — ABNORMAL LOW (ref 39.0–52.0)
Hemoglobin: 11.9 g/dL — ABNORMAL LOW (ref 13.0–17.0)
O2 Saturation: 95 %
Patient temperature: 37.3
Potassium: 4.8 mmol/L (ref 3.5–5.1)
Sodium: 131 mmol/L — ABNORMAL LOW (ref 135–145)
TCO2: 20 mmol/L — ABNORMAL LOW (ref 22–32)
pCO2 arterial: 31.5 mmHg — ABNORMAL LOW (ref 32–48)
pH, Arterial: 7.381 (ref 7.35–7.45)
pO2, Arterial: 79 mmHg — ABNORMAL LOW (ref 83–108)

## 2021-09-18 LAB — ECHOCARDIOGRAM COMPLETE
AR max vel: 3.83 cm2
AV Area VTI: 3.37 cm2
AV Area mean vel: 3.44 cm2
AV Mean grad: 1 mmHg
AV Peak grad: 2.4 mmHg
Ao pk vel: 0.77 m/s
Area-P 1/2: 3.36 cm2
Height: 68 in
S' Lateral: 3.5 cm
Weight: 2400 oz

## 2021-09-18 LAB — POTASSIUM: Potassium: 4 mmol/L (ref 3.5–5.1)

## 2021-09-18 LAB — MAGNESIUM
Magnesium: 2 mg/dL (ref 1.7–2.4)
Magnesium: 1.8 mg/dL (ref 1.7–2.4)

## 2021-09-18 LAB — CBC
HCT: 33.9 % — ABNORMAL LOW (ref 39.0–52.0)
Hemoglobin: 11.9 g/dL — ABNORMAL LOW (ref 13.0–17.0)
MCH: 31.3 pg (ref 26.0–34.0)
MCHC: 35.1 g/dL (ref 30.0–36.0)
MCV: 89.2 fL (ref 80.0–100.0)
Platelets: 414 10*3/uL — ABNORMAL HIGH (ref 150–400)
RBC: 3.8 MIL/uL — ABNORMAL LOW (ref 4.22–5.81)
RDW: 13.3 % (ref 11.5–15.5)
WBC: 25.4 10*3/uL — ABNORMAL HIGH (ref 4.0–10.5)
nRBC: 0 % (ref 0.0–0.2)

## 2021-09-18 LAB — BASIC METABOLIC PANEL
Anion gap: 8 (ref 5–15)
BUN: 48 mg/dL — ABNORMAL HIGH (ref 8–23)
CO2: 20 mmol/L — ABNORMAL LOW (ref 22–32)
Calcium: 8.1 mg/dL — ABNORMAL LOW (ref 8.9–10.3)
Chloride: 100 mmol/L (ref 98–111)
Creatinine, Ser: 1.85 mg/dL — ABNORMAL HIGH (ref 0.61–1.24)
GFR, Estimated: 36 mL/min — ABNORMAL LOW (ref >60)
Glucose, Bld: 407 mg/dL — ABNORMAL HIGH (ref 70–99)
Potassium: 4.9 mmol/L (ref 3.5–5.1)
Sodium: 128 mmol/L — ABNORMAL LOW (ref 135–145)

## 2021-09-18 LAB — LACTIC ACID, PLASMA
Lactic Acid, Venous: 3.8 mmol/L (ref 0.5–1.9)
Lactic Acid, Venous: 2.7 mmol/L (ref 0.5–1.9)
Lactic Acid, Venous: 2.2 mmol/L (ref 0.5–1.9)

## 2021-09-18 LAB — MRSA NEXT GEN BY PCR, NASAL: MRSA by PCR Next Gen: NOT DETECTED

## 2021-09-18 MED ORDER — SODIUM CHLORIDE 0.9 % IV SOLN
INTRAVENOUS | Status: DC
Start: 2021-09-18 — End: 2021-09-21

## 2021-09-18 MED ORDER — CHLORHEXIDINE GLUCONATE CLOTH 2 % EX PADS
6.0000 | MEDICATED_PAD | Freq: Every day | CUTANEOUS | Status: DC
Start: 2021-09-19 — End: 2021-09-25
  Administered 2021-09-19 – 2021-09-25 (×4): 6 via TOPICAL

## 2021-09-18 MED ORDER — SODIUM CHLORIDE 0.9 % IV SOLN: 250.0000 mL | INTRAVENOUS | Status: DC | PRN

## 2021-09-18 MED ORDER — FUROSEMIDE 10 MG/ML IJ SOLN: 40.0000 mg | Freq: Two times a day (BID) | INTRAMUSCULAR | Status: DC

## 2021-09-18 MED ORDER — DOBUTAMINE IN D5W 4-5 MG/ML-% IV SOLN: 10.0000 ug/kg/min | INTRAVENOUS | Status: DC

## 2021-09-18 MED ORDER — SODIUM CHLORIDE 0.9 % IV SOLN: INTRAVENOUS | Status: DC

## 2021-09-18 MED ORDER — HEPARIN SODIUM (PORCINE) 5000 UNIT/ML IJ SOLN
5000.0000 [IU] | Freq: Three times a day (TID) | INTRAMUSCULAR | Status: DC
  Administered 2021-09-18 – 2021-09-21 (×8): 5000 [IU] via SUBCUTANEOUS
  Filled 2021-09-18 (×9): qty 1

## 2021-09-18 MED ORDER — SODIUM CHLORIDE 0.9% FLUSH
3.0000 mL | Freq: Two times a day (BID) | INTRAVENOUS | Status: DC
Start: 2021-09-18 — End: 2021-09-25
  Administered 2021-09-18: 3 mL via INTRAVENOUS
  Administered 2021-09-18 – 2021-09-19 (×2): 10 mL via INTRAVENOUS
  Administered 2021-09-19 – 2021-09-25 (×10): 3 mL via INTRAVENOUS

## 2021-09-18 MED ORDER — HYDRALAZINE HCL 20 MG/ML IJ SOLN: 10.0000 mg | INTRAMUSCULAR | Status: AC | PRN

## 2021-09-18 MED ORDER — ONDANSETRON HCL 4 MG/2ML IJ SOLN: 4.0000 mg | Freq: Four times a day (QID) | INTRAMUSCULAR | Status: DC | PRN

## 2021-09-18 MED ORDER — LABETALOL HCL 5 MG/ML IV SOLN: 10.0000 mg | INTRAVENOUS | Status: AC | PRN

## 2021-09-18 MED ORDER — LIVING WELL WITH DIABETES BOOK
Freq: Once | Status: AC
Start: 2021-09-18 — End: 2021-09-18
  Filled 2021-09-18: qty 1

## 2021-09-18 MED ORDER — DORZOLAMIDE HCL 2 % OP SOLN
1.0000 [drp] | Freq: Every day | OPHTHALMIC | Status: DC
  Administered 2021-09-18 – 2021-09-25 (×8): 1 [drp] via OPHTHALMIC
  Filled 2021-09-18: qty 10

## 2021-09-18 MED ORDER — MAGNESIUM SULFATE 2 GM/50ML IV SOLN
2.0000 g | Freq: Once | INTRAVENOUS | Status: AC
  Administered 2021-09-18: 2 g via INTRAVENOUS
  Filled 2021-09-18: qty 50

## 2021-09-18 MED ORDER — ESCITALOPRAM OXALATE 10 MG PO TABS
10.0000 mg | ORAL_TABLET | Freq: Every day | ORAL | Status: DC
  Filled 2021-09-18: qty 1

## 2021-09-18 MED ORDER — MIDODRINE HCL 5 MG PO TABS
2.5000 mg | ORAL_TABLET | Freq: Three times a day (TID) | ORAL | Status: DC
  Administered 2021-09-18 – 2021-09-20 (×5): 2.5 mg via ORAL
  Filled 2021-09-18 (×5): qty 1

## 2021-09-18 MED ORDER — NOREPINEPHRINE 16 MG/250ML-% IV SOLN
0.0000 ug/min | INTRAVENOUS | Status: DC
  Administered 2021-09-18: 3 ug/min via INTRAVENOUS
  Administered 2021-09-18: 13 ug/min via INTRAVENOUS
  Filled 2021-09-18 (×2): qty 250

## 2021-09-18 MED ORDER — SODIUM CHLORIDE 0.9 % WEIGHT BASED INFUSION: 1.0000 mL/kg/h | INTRAVENOUS | Status: DC

## 2021-09-18 MED ORDER — NOREPINEPHRINE 4 MG/250ML-% IV SOLN: 0.0000 ug/min | INTRAVENOUS | Status: DC

## 2021-09-18 MED ORDER — ACETAMINOPHEN 325 MG PO TABS: 650.0000 mg | ORAL_TABLET | ORAL | Status: DC | PRN

## 2021-09-18 MED ORDER — SODIUM CHLORIDE 0.9% IV SOLUTION: INTRAVENOUS | Status: DC

## 2021-09-18 MED ORDER — HYDROCODONE-ACETAMINOPHEN 5-325 MG PO TABS: 1.0000 | ORAL_TABLET | Freq: Four times a day (QID) | ORAL | Status: DC | PRN

## 2021-09-18 MED ORDER — DOBUTAMINE IN D5W 4-5 MG/ML-% IV SOLN
1.0000 ug/kg/min | INTRAVENOUS | Status: DC
  Administered 2021-09-18: 5 ug/kg/min via INTRAVENOUS
  Administered 2021-09-21: 3 ug/kg/min via INTRAVENOUS
  Filled 2021-09-18 (×2): qty 250

## 2021-09-18 MED ORDER — INSULIN DETEMIR 100 UNIT/ML ~~LOC~~ SOLN
5.0000 [IU] | Freq: Two times a day (BID) | SUBCUTANEOUS | Status: DC
  Administered 2021-09-18 – 2021-09-23 (×10): 5 [IU] via SUBCUTANEOUS
  Filled 2021-09-18 (×12): qty 0.05

## 2021-09-18 MED ORDER — MILRINONE LACTATE IN DEXTROSE 20-5 MG/100ML-% IV SOLN
0.3750 ug/kg/min | INTRAVENOUS | Status: DC
Start: 2021-09-18 — End: 2021-09-18
  Administered 2021-09-18: 0.375 ug/kg/min via INTRAVENOUS
  Filled 2021-09-18: qty 100

## 2021-09-18 MED ORDER — SODIUM CHLORIDE 0.9% IV SOLUTION: INTRAVENOUS | Status: DC | PRN

## 2021-09-18 MED ORDER — SODIUM BICARBONATE 8.4 % IV SOLN
100.0000 meq | Freq: Once | INTRAVENOUS | Status: AC
  Administered 2021-09-18: 100 meq via INTRAVENOUS
  Filled 2021-09-18: qty 100

## 2021-09-18 MED ORDER — TIROFIBAN HCL IV 12.5 MG/250 ML
0.0750 ug/kg/min | INTRAVENOUS | Status: AC
  Administered 2021-09-18: 0.075 ug/kg/min via INTRAVENOUS
  Filled 2021-09-18: qty 250

## 2021-09-18 MED ORDER — ATORVASTATIN CALCIUM 80 MG PO TABS
80.0000 mg | ORAL_TABLET | Freq: Every day | ORAL | Status: DC
  Administered 2021-09-18 – 2021-09-25 (×8): 80 mg via ORAL
  Filled 2021-09-18 (×8): qty 1

## 2021-09-18 MED ORDER — SODIUM CHLORIDE 0.9% FLUSH: 3.0000 mL | INTRAVENOUS | Status: DC | PRN

## 2021-09-18 MED ORDER — DULOXETINE HCL 30 MG PO CPEP
30.0000 mg | ORAL_CAPSULE | Freq: Every day | ORAL | Status: DC
  Administered 2021-09-18 – 2021-09-25 (×8): 30 mg via ORAL
  Filled 2021-09-18 (×8): qty 1

## 2021-09-18 MED ORDER — LATANOPROST 0.005 % OP SOLN
1.0000 [drp] | Freq: Every day | OPHTHALMIC | Status: DC
  Administered 2021-09-18 – 2021-09-24 (×7): 1 [drp] via OPHTHALMIC
  Filled 2021-09-18 (×2): qty 2.5

## 2021-09-18 MED ORDER — PERFLUTREN LIPID MICROSPHERE
1.0000 mL | INTRAVENOUS | Status: AC | PRN
  Administered 2021-09-18: 2 mL via INTRAVENOUS

## 2021-09-18 MED ORDER — ORAL CARE MOUTH RINSE
15.0000 mL | OROMUCOSAL | Status: DC | PRN
Start: 2021-09-18 — End: 2021-09-25

## 2021-09-18 MED ORDER — ARFORMOTEROL TARTRATE 15 MCG/2ML IN NEBU
15.0000 ug | INHALATION_SOLUTION | Freq: Two times a day (BID) | RESPIRATORY_TRACT | Status: DC
  Administered 2021-09-18 – 2021-09-25 (×15): 15 ug via RESPIRATORY_TRACT
  Filled 2021-09-18 (×15): qty 2

## 2021-09-18 NOTE — Telephone Encounter (Signed)
Pharmacy Patient Advocate Encounter  Insurance verification completed.    The patient is insured through [employer] Part D   The patient is currently admitted and ran test claims for the following: Brilinta.  Copays and coinsurance results were relayed to Inpatient clinical team.

## 2021-09-18 NOTE — Progress Notes (Signed)
Rounding Note    Patient Name: Tommy Graham Date of Encounter: 09/18/2021  [place] Cardiologist: None Burt Knack)  Subjective   S/p PCI of RCA due late presentation STEMI.    Remains on norepi 6/tirofiban  I placed the patient supine>>CVP 7, PA 21/14, wedge 12 MAP high 70s, Co-ox this AM 38.6; TD outputs at bedside CO 2.4/CI 1.33; I/O +605; ABG 7.38/pCo2 31/ Po2 77/ bicarb 18  Cr up to 1.85  Overnight, patient remains chest pain free; feels weak   Inpatient Medications    Scheduled Meds:  aspirin  81 mg Oral Daily   atorvastatin  80 mg Oral Daily   Chlorhexidine Gluconate Cloth  6 each Topical Q0600   DULoxetine  30 mg Oral Daily   escitalopram  10 mg Oral Daily   heparin  5,000 Units Subcutaneous Q8H   insulin aspart  0-15 Units Subcutaneous TID WC   insulin detemir  10 Units Subcutaneous QHS   sodium chloride flush  3 mL Intravenous Q12H   ticagrelor  90 mg Oral BID   Continuous Infusions:  sodium chloride     sodium chloride     sodium chloride 75 mL/hr at 09/18/21 0600   milrinone     norepinephrine (LEVOPHED) Adult infusion 7 mcg/min (09/18/21 0600)   sodium chloride Stopped (09/17/21 2252)   tirofiban 0.075 mcg/kg/min (09/18/21 0600)   PRN Meds: sodium chloride, acetaminophen, HYDROcodone-acetaminophen, ondansetron (ZOFRAN) IV, sodium chloride flush   Vital Signs    Vitals:   09/18/21 0615 09/18/21 0630 09/18/21 0645 09/18/21 0700  BP:    122/79  Pulse: 69 74 70 76  Resp: '20 18 20 '$ (!) 21  Temp: 98.4 F (36.9 C) 98.6 F (37 C) 98.6 F (37 C) 98.6 F (37 C)  SpO2: 99% 99% 99% 96%  Weight:      Height:        Intake/Output Summary (Last 24 hours) at 09/18/2021 0811 Last data filed at 09/18/2021 0600 Gross per 24 hour  Intake 965.21 ml  Output --  Net 965.21 ml      09/17/2021    6:45 PM 02/02/2021   11:13 AM 02/02/2006    9:02 AM  Last 3 Weights  Weight (lbs) 150 lb 144 lb 15.9 oz 166 lb  Weight (kg) 68.04 kg 65.77 kg 75.297 kg       Telemetry    SR occ PVCs no VT - Personally Reviewed  ECG    SR with inferior infarction  - Personally Reviewed  Physical Exam   GEN: No acute distress.   Neck: RIJ swan in place Cardiac: RRR, no murmurs, rubs, or gallops.  Respiratory: Clear to auscultation bilaterally. GI: Soft, nontender, non-distended  MS: No edema; No deformity. Neuro:  Nonfocal  Psych: Normal affect  Ext:  Feels relatively warm; R radial CDI  Labs    High Sensitivity Troponin:   Recent Labs  Lab 09/17/21 1858  TROPONINIHS >24,000*     Chemistry Recent Labs  Lab 09/17/21 1858 09/17/21 2059 09/17/21 2114 09/17/21 2342 09/18/21 0235  NA 127*   < > 125* 125* 128*  K 5.8*   < > 5.3* 6.6* 4.9  CL 93*  --   --   --  100  CO2 23  --   --   --  20*  GLUCOSE 456*  --   --   --  407*  BUN 45*  --   --   --  48*  CREATININE  1.64*  --   --   --  1.85*  CALCIUM 8.9  --   --   --  8.1*  MG  --   --   --   --  2.0  PROT 7.1  --   --   --   --   ALBUMIN 3.9  --   --   --   --   AST 268*  --   --   --   --   ALT 78*  --   --   --   --   ALKPHOS 85  --   --   --   --   BILITOT 1.0  --   --   --   --   GFRNONAA 42*  --   --   --  36*  ANIONGAP 11  --   --   --  8   < > = values in this interval not displayed.    Lipids  Recent Labs  Lab 09/17/21 1858  CHOL 148  TRIG 113  HDL 34*  LDLCALC 91  CHOLHDL 4.4    Hematology Recent Labs  Lab 09/17/21 1858 09/17/21 2059 09/17/21 2114 09/17/21 2342 09/18/21 0235  WBC 23.5*  --   --   --  25.4*  RBC 4.06*  --   --   --  3.80*  HGB 12.8*   < > 11.2* 11.6* 11.9*  HCT 37.0*   < > 33.0* 34.0* 33.9*  MCV 91.1  --   --   --  89.2  MCH 31.5  --   --   --  31.3  MCHC 34.6  --   --   --  35.1  RDW 13.2  --   --   --  13.3  PLT 411*  --   --   --  414*   < > = values in this interval not displayed.   Thyroid No results for input(s): "TSH", "FREET4" in the last 168 hours.  BNPNo results for input(s): "BNP", "PROBNP" in the last 168 hours.   DDimer No results for input(s): "DDIMER" in the last 168 hours.   Radiology    CARDIAC CATHETERIZATION  Result Date: 09/17/2021   Prox RCA lesion is 100% stenosed.   Mid LM to Ost LAD lesion is 80% stenosed.   Mid LAD lesion is 50% stenosed.   Dist LAD lesion is 80% stenosed.   A drug-eluting stent was successfully placed using a SYNERGY XD [id number].   Post intervention, there is a 0% residual stenosis.   There is moderate left ventricular systolic dysfunction.   LV end diastolic pressure is moderately elevated. 1.  Acute inferolateral STEMI complicated by RV infarction and cardiogenic shock with thrombotic occlusion of the proximal RCA treated with PTCA, aspiration thrombectomy, and drug-eluting stent implantation 2.  Severe distal left main and ostial LAD stenosis of 75% 3.  Patent left circumflex 4.  Probable moderate LV systolic dysfunction with hypokinesis of the inferior wall, but LV poorly visualized with hand-injection ventriculography 5.  Cardiogenic shock (RV) requiring vasopressor therapy 6.  Placement of right IJ introducer and Swan-Ganz catheter for invasive hemodynamic monitoring 7.  Placement of left radial arterial line for invasive blood pressure monitoring Recommendations: Continue volume resuscitation and Levophed for hemodynamic support.  The patient has stabilized with the above measures.  His hemodynamics are consistent with RV infarction/failure with elevated RA pressure of 21 mmHg, cardiac index of 1.96, and very low PA  pulsatility.  As long as he recovers well with ICU care and supportive measures, we will need to determine best revascularization strategy for his left main/proximal LAD disease.  Considering the small size of the circumflex, PCI might be appropriate.  Family updated on patient's condition and issues outlined above.   DG Chest Port 1 View  Result Date: 09/17/2021 CLINICAL DATA:  Dizziness. EXAM: PORTABLE CHEST 1 VIEW COMPARISON:  December 26, 2014 FINDINGS: The heart  size and mediastinal contours are within normal limits. There is no evidence of acute infiltrate, pleural effusion or pneumothorax. A radiopaque fusion plate and screws are seen overlying the lower cervical spine. Multilevel degenerative changes seen throughout the thoracic spine. IMPRESSION: No active cardiopulmonary disease. Electronically Signed   By: Virgina Norfolk M.D.   On: 09/17/2021 19:24    Cardiac Studies   Review coronary angiography study demonstrates high-grade distal left main into ostial LAD lesion as well as high-grade distal LAD lesion; the left circumflex is small and nondominant.  TTE is pending however on my review during acquisition of bedside RV looks mildly dilated with a TAPSE of 1.1; LVEF looks to be significantly diminished at around 20% with inferolateral akinesis; I do not appreciate significant mitral regurgitation or any mechanical complications Patient Profile     81 y.o. male hypertension, hyperlipidemia, type 2 diabetes who presents with late presentation ST elevation myocardial infarction after being at home for around 30 hours.  Assessment & Plan    1.  Acute coronary syndrome: Patient had PCI of the right coronary artery which is large and dominant.  This was a late presentation ST elevation myocardial infarction.  Cont DAPT, aggrastat until noon, high dose atorvastatin.  Hold beta-blocker given large RV infarct.   2.  Cardiogenic shock:   He has sustained an RV infarction with a  large territory which was ischemic.  His LV EF is severely diminished ejection fraction less than 30%CVP 7, PAPI 1 (improved but low) but CO/CI low.  LFTs elevated.  Cr up.  We will stop IV fluids now.  We will start milrinone 0.375 now.  ABG this AM ok; PaO2 77 (CXR without significant infiltrates; wedge pressure 12).  Bicarb 18, give 2 amps bicarbonate.  Check co-ox at noon.  Watch I/O; keep net even for now with intermittent lasix if necessary. We will have to have heart failure  managed patient over the weekend. 3.  Coronary artery disease: Continue ticagrelor, aspirin, high-dose statin; given significant residual left main disease and nondominant left circumflex I think the best option after medical stabilization is PCI of the left main jailing the left circumflex. 4.  Hyperlipidemia: Continue high-dose atorvastatin 5.  Hypertension: Shoot for goal MAP of 65 to 75 mmHg; goal-directed medical therapy later. 6.  Type 2 diabetes: Reviewed with pharmacy; continue scale insulin and blood sugars are improving.   7.  AKI:  Cr up to 1.8; monitor for now.  Watch UOP.     For questions or updates, please contact [place] Please consult [email or web address] for contact info under   CRITICAL CARE Performed by: Lenna Sciara   Total critical care time: 70 minutes. Critical care time was exclusive of separately billable procedures and treating other patients. Critical care was necessary to treat or prevent imminent or life-threatening deterioration. Critical care was time spent personally by me on the following activities: development of treatment plan with patient and/or surrogate as well as nursing, discussions with consultants, evaluation of patient's response to treatment, examination  of patient, obtaining history from patient or surrogate, ordering and performing treatments and interventions, ordering and review of laboratory studies, ordering and review of radiographic studies, pulse oximetry and re-evaluation of patient's condition.     Signed, Early Osmond, MD  09/18/2021, 8:11 AM

## 2021-09-18 NOTE — TOC Benefit Eligibility Note (Addendum)
Patient Teacher, English as a foreign language completed.    The patient is currently admitted and upon discharge could be taking Brilinta 90 mg.  The current 30 day co-pay is $45.00.   The patient is currently admitted and upon discharge could be taking Entresto 24-26 mg.  The current 30 day co-pay is $45.00.   The patient is currently admitted and upon discharge could be taking Farxiga 10 mg.  The current 30 day co-pay is $45.00.   The patient is currently admitted and upon discharge could be taking Jardiance 10 mg.  The current 30 day co-pay is $45.00.   The patient is insured through [place], [place] Patient Advocate Specialist [place] Patient Advocate Team Direct Number: [phone]  Fax: [phone]

## 2021-09-18 NOTE — TOC Initial Note (Signed)
Transition of Care [facility]) - Initial/Assessment Note    Patient Details  Name: Tommy Graham MRN: [id number] Date of Birth: 1941/01/14  [facility] CM/SW Contact:    Erenest Rasher, RN Phone Number: [phone] 09/18/2021, 5:41 PM  Clinical Narrative:                 HF TOC CM spoke to pt's grand-dtr, Tommy Graham and son, Tommy Graham. States pt was independent prior to hospital stay. Will continue to follow up for dc needs.   Expected Discharge Plan: [place] Barriers to Discharge: Continued Medical Work up   Patient Goals and CMS Choice Patient states their goals for this hospitalization and ongoing recovery are:: wants him to get better CMS Medicare.gov Compare Post Acute Care list provided to:: Patient Represenative (must comment) Tommy Graham -son)    Expected Discharge Plan and Services Expected Discharge Plan: [place]   Discharge Planning Services: CM Consult   Living arrangements for the past 2 months: Single Family Home   Prior Living Arrangements/Services Living arrangements for the past 2 months: Single Family Home Lives with:: Spouse Patient language and need for interpreter reviewed:: Yes Do you feel safe going back to the place where you live?: Yes            Criminal Activity/Legal Involvement Pertinent to Current Situation/Hospitalization: No - Comment as needed  Activities of Daily Living      Permission Sought/Granted Permission sought to share information with : Case Manager, Family Supports, PCP Permission granted to share information with : Yes, Verbal Permission Granted  Share Information with NAME: Tommy Graham     Permission granted to share info w Relationship: son  Permission granted to share info w Contact Information: [phone]  Emotional Assessment    Psych Involvement: No (comment)  Admission diagnosis:  ST elevation myocardial infarction (STEMI), unspecified artery ([place])  [I21.3] STEMI involving right coronary artery ([place]) [I21.11] Patient Active Problem List   Diagnosis Date Noted   STEMI involving right coronary artery ([place]) 09/17/2021   DIABETES MELLITUS, TYPE II 09/03/2006   HYPERTENSION 09/03/2006   PCP:  Cory Munch, PA-C Pharmacy:   [place], [address] [id number] Phone: [phone] Fax: [phone]     Social Determinants of Health (SDOH) Interventions    Readmission Risk Interventions     No data to display

## 2021-09-18 NOTE — Consult Note (Signed)
Advanced Heart Failure Rounding Note   Subjective:    Tommy Graham is an 81 year old male with uncontrolled DM2, HTN, HL gentleman with background history of diabetes, hypertension, and mixed hyperlipidemia whom we are asked to see due to cardiogenic shock in setting of RV failure due to late presenting inferolateral STEMI with RV involvement  Developed CP while doing yardwork on 8/30. Pain worsened. Presented to [facility] ER on 8/31 found to have inferolateral ST elevation. Found to have proximal occlusion of RCA and 75% distal LM/osital LAD lesion. EF ~45%. Hemodynamics c/w with low-output RV failure  Now on milrinone 0.375. Co-ox 39% -> 50%   Denies CP or SOB.  Swan numbers done personally at bedside  RA 9-10 PA 23/16  PCWP 10 Thermo 3.0/1.7 PAPi = 0.7   Objective:   Weight Range:  Vital Signs:   Temp:  [97.2 F (36.2 C)-99.1 F (37.3 C)] 99.1 F (37.3 C) (09/01 0900) Pulse Rate:  [58-149] 78 (09/01 0900) Resp:  [16-35] 21 (09/01 0900) BP: (68-126)/(56-98) 121/71 (09/01 0900) SpO2:  [63 %-100 %] 95 % (09/01 1104) Arterial Line BP: (89-138)/(48-76) 111/50 (09/01 0900) Weight:  [68 kg] 68 kg (08/31 1845) Last BM Date : 09/17/21  Weight change: Filed Weights   09/17/21 1845  Weight: 68 kg    Intake/Output:   Intake/Output Summary (Last 24 hours) at 09/18/2021 1234 Last data filed at 09/18/2021 0600 Gross per 24 hour  Intake 965.21 ml  Output --  Net 965.21 ml     Physical Exam: General:  Elderly  No resp difficulty HEENT: normal Neck: supple. JVP 10 . Carotids 2+ bilat; no bruits. No lymphadenopathy or thryomegaly appreciated. Cor: PMI nondisplaced. Regular rate & rhythm. No rubs, gallops or murmurs. Lungs: clear Abdomen: soft, nontender, nondistended. No hepatosplenomegaly. No bruits or masses. Good bowel sounds. Extremities: no cyanosis, clubbing, rash, edema Neuro: alert & orientedx3, cranial nerves grossly intact. moves all 4 extremities w/o difficulty.  Affect pleasant  Telemetry: Sinus 70s Personally reviewed   Labs: Basic Metabolic Panel: Recent Labs  Lab 09/17/21 1858 09/17/21 2059 09/17/21 2103 09/17/21 2114 09/17/21 2342 09/18/21 0235 09/18/21 0917  NA 127*   < > 129* 125* 125* 128* 131*  K 5.8*   < > 5.3* 5.3* 6.6* 4.9 4.8  CL 93*  --   --   --   --  100  --   CO2 23  --   --   --   --  20*  --   GLUCOSE 456*  --   --   --   --  407*  --   BUN 45*  --   --   --   --  48*  --   CREATININE 1.64*  --   --   --   --  1.85*  --   CALCIUM 8.9  --   --   --   --  8.1*  --   MG  --   --   --   --   --  2.0  --    < > = values in this interval not displayed.    Liver Function Tests: Recent Labs  Lab 09/17/21 1858 09/18/21 0915  AST 268* 328*  ALT 78* 106*  ALKPHOS 85 65  BILITOT 1.0 0.6  PROT 7.1 5.5*  ALBUMIN 3.9 2.8*   No results for input(s): "LIPASE", "AMYLASE" in the last 168 hours. No results for input(s): "AMMONIA" in the last 168 hours.  CBC: Recent Labs  Lab 09/17/21 1858 09/17/21 2059 09/17/21 2103 09/17/21 2114 09/17/21 2342 09/18/21 0235 09/18/21 0917  WBC 23.5*  --   --   --   --  25.4*  --   NEUTROABS 20.4*  --   --   --   --   --   --   HGB 12.8*   < > 11.2* 11.2* 11.6* 11.9* 11.9*  HCT 37.0*   < > 33.0* 33.0* 34.0* 33.9* 35.0*  MCV 91.1  --   --   --   --  89.2  --   PLT 411*  --   --   --   --  414*  --    < > = values in this interval not displayed.    Cardiac Enzymes: No results for input(s): "CKTOTAL", "CKMB", "CKMBINDEX", "TROPONINI" in the last 168 hours.  BNP: BNP (last 3 results) No results for input(s): "BNP" in the last 8760 hours.  ProBNP (last 3 results) No results for input(s): "PROBNP" in the last 8760 hours.    Other results:  Imaging: DG Chest Port 1 View  Result Date: 09/18/2021 CLINICAL DATA:  Manipulation of central venous catheter. EXAM: PORTABLE CHEST 1 VIEW COMPARISON:  September 18, 2021 FINDINGS: RIGHT IJ Swan-Ganz catheter is been advanced  approximately 2 cm, tip overlies the RIGHT hilum in the area of the RIGHT main or inter lobar pulmonary artery. EKG leads project over the chest. Signs of spinal fusion partially visualized over the lower cervical spine. Trachea midline. Cardiomediastinal contours and hilar structures are normal. Lungs are clear.  No visible pneumothorax. On limited assessment there is no acute skeletal process. IMPRESSION: RIGHT IJ Swan-Ganz catheter is been advanced approximately 2 cm, tip overlies the RIGHT hilum in the area of the RIGHT main or inter lobar pulmonary artery. No acute cardiopulmonary disease. Electronically Signed   By: Zetta Bills M.D.   On: 09/18/2021 08:40   DG CHEST PORT 1 VIEW  Result Date: 09/18/2021 CLINICAL DATA:  Post stemi, line placement EXAM: PORTABLE CHEST - 1 VIEW COMPARISON:  the previous day's study FINDINGS: Right IJ Swan-Ganz to the proximal right pulmonary artery. No pneumothorax. Relatively low lung volumes with mild hazy opacity over both lung bases. Heart size and mediastinal contours are within normal limits. No effusion. Cervical fixation hardware partially visualized. IMPRESSION: Central line to the proximal right pulmonary artery without pneumothorax. Electronically Signed   By: Lucrezia Europe M.D.   On: 09/18/2021 08:35   CARDIAC CATHETERIZATION  Result Date: 09/17/2021   Prox RCA lesion is 100% stenosed.   Mid LM to Ost LAD lesion is 80% stenosed.   Mid LAD lesion is 50% stenosed.   Dist LAD lesion is 80% stenosed.   A drug-eluting stent was successfully placed using a SYNERGY XD [id number].   Post intervention, there is a 0% residual stenosis.   There is moderate left ventricular systolic dysfunction.   LV end diastolic pressure is moderately elevated. 1.  Acute inferolateral STEMI complicated by RV infarction and cardiogenic shock with thrombotic occlusion of the proximal RCA treated with PTCA, aspiration thrombectomy, and drug-eluting stent implantation 2.  Severe distal left main  and ostial LAD stenosis of 75% 3.  Patent left circumflex 4.  Probable moderate LV systolic dysfunction with hypokinesis of the inferior wall, but LV poorly visualized with hand-injection ventriculography 5.  Cardiogenic shock (RV) requiring vasopressor therapy 6.  Placement of right IJ introducer and Swan-Ganz catheter for invasive hemodynamic monitoring 7.  Placement of left radial arterial line for invasive blood pressure monitoring Recommendations: Continue volume resuscitation and Levophed for hemodynamic support.  The patient has stabilized with the above measures.  His hemodynamics are consistent with RV infarction/failure with elevated RA pressure of 21 mmHg, cardiac index of 1.96, and very low PA pulsatility.  As long as he recovers well with ICU care and supportive measures, we will need to determine best revascularization strategy for his left main/proximal LAD disease.  Considering the small size of the circumflex, PCI might be appropriate.  Family updated on patient's condition and issues outlined above.   DG Chest Port 1 View  Result Date: 09/17/2021 CLINICAL DATA:  Dizziness. EXAM: PORTABLE CHEST 1 VIEW COMPARISON:  December 26, 2014 FINDINGS: The heart size and mediastinal contours are within normal limits. There is no evidence of acute infiltrate, pleural effusion or pneumothorax. A radiopaque fusion plate and screws are seen overlying the lower cervical spine. Multilevel degenerative changes seen throughout the thoracic spine. IMPRESSION: No active cardiopulmonary disease. Electronically Signed   By: Virgina Norfolk M.D.   On: 09/17/2021 19:24     Medications:     Scheduled Medications:  arformoterol  15 mcg Nebulization BID   aspirin  81 mg Oral Daily   atorvastatin  80 mg Oral Daily   Chlorhexidine Gluconate Cloth  6 each Topical Q0600   dorzolamide  1 drop Both Eyes Daily   DULoxetine  30 mg Oral Daily   heparin  5,000 Units Subcutaneous Q8H   insulin aspart  0-15 Units  Subcutaneous TID WC   insulin detemir  10 Units Subcutaneous QHS   latanoprost  1 drop Both Eyes QHS   sodium chloride flush  3 mL Intravenous Q12H   ticagrelor  90 mg Oral BID    Infusions:  sodium chloride     sodium chloride     sodium chloride 75 mL/hr at 09/18/21 0600   milrinone 0.375 mcg/kg/min (09/18/21 0824)   norepinephrine (LEVOPHED) Adult infusion 7 mcg/min (09/18/21 0600)   sodium chloride Stopped (09/17/21 2252)   tirofiban 0.075 mcg/kg/min (09/18/21 0600)    PRN Medications: sodium chloride, acetaminophen, HYDROcodone-acetaminophen, ondansetron (ZOFRAN) IV, sodium chloride flush   Assessment/Plan   1. Cardiogenic shock due primarily to RV infarct in setting of late-presenting RCA STEMI - continue milrinone/NE for now and follow co-ox and BP closely - If BP low or needs more RV inotropy can switch milrinone to dobutamine or add low-dose epi   2. 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 AKI  - due to shock +/- CIN component - Baseline SCr 0.96 -> 1.65 -> 1.84 - continue hemodynamic support   4. DM2, poorly controlled - HGBa1c 9.5% - cover SSI - DM2 consult - SGLT2i this admit  5. Shock liver - follow LFTs - continue hemodynamic support  CRITICAL CARE Performed by: Glori Bickers  Total critical care time: 45 minutes  Critical care time was exclusive of separately billable procedures and treating other patients.  Critical care was necessary to treat or prevent imminent or life-threatening deterioration.  Critical care was time spent personally by me (independent of midlevel providers or residents) on the following activities: development of treatment plan with patient and/or surrogate as well as nursing, discussions with consultants, evaluation of  patient's response to treatment, examination  of patient, obtaining history from patient or surrogate, ordering and performing treatments and interventions, ordering and review of laboratory studies, ordering and review of radiographic studies, pulse oximetry and re-evaluation of patient's condition.  Length of Stay: 1   Glori Bickers MD 09/18/2021, 12:34 PM  Advanced Heart Failure Team Pager [phone] (M-F; 7a - 4p)  Please contact [facility] for night-coverage after hours (4p -7a ) and weekends on [email or web address]

## 2021-09-18 NOTE — Progress Notes (Signed)
EKG CRITICAL VALUE     12 lead EKG performed.  Critical value noted.  York Cerise, RN notified.   Warren Lacy, CCT 09/18/2021 7:40 AM

## 2021-09-18 NOTE — Inpatient Diabetes Management (Addendum)
Inpatient Diabetes Program Recommendations  AACE/ADA: New Consensus Statement on Inpatient Glycemic Control (2015)  Target Ranges:  Prepandial:   less than 140 mg/dL      Peak postprandial:   less than 180 mg/dL (1-2 hours)      Critically ill patients:  140 - 180 mg/dL   Lab Results  Component Value Date   GLUCAP 137 (H) 09/18/2021   HGBA1C 9.5 (H) 09/17/2021    Review of Glycemic Control  Diabetes history: type 2 Outpatient Diabetes medications: Lantus 20 units at HS, Metformin 850 mg BID Current orders for Inpatient glycemic control: Levemir 10 units at HS, Novolog 0-15 units TID  Inpatient Diabetes Program Recommendations:   Spoke with patient at the bedside. Patient was diagnosed about 30 years ago with diabetes. Had been on Metformin for many years. States that he has been on insulin for a while. He states that his blood sugars had been elevated when he had his heart attack on Wednesday. He had been trying to decrease the blood sugars at that time. He checks his blood sugars frequently throughout the day.   States that he had been giving his insulin in his abdomen and thighs, but it seemed that the insulin was not working. Reviewed where to give injections in the abdomen and outer thighs and outer part of arms. Important to rotate sites frequently. Noted that HgbA1C is 9.5% which he states is about what it has been recently.  Had questions about his diet; states that his wife is a great cook. He knows that he needs to be more mindful of what he is eating. Will order Living Well with Diabetes booklet for him. States that he is interested in a [employer] CGM for checking blood sugars at home. States that he would need a reader since he does not use a cell phone. This would need to be obtained through Medicare. Will give patient a Freestyle Libre 2 CGM to take home with him.  At discharge: Freestyle Libre 2 CGM reader: order [phone]  Will continue to monitor blood sugars while in  the hospital.  Harvel Ricks RN BSN CDE Diabetes Coordinator Pager: [phone]  8am-5pm

## 2021-09-19 ENCOUNTER — Encounter (HOSPITAL_COMMUNITY): Payer: Self-pay | Admitting: Cardiovascular Disease

## 2021-09-19 DIAGNOSIS — I2111 ST elevation (STEMI) myocardial infarction involving right coronary artery: Secondary | ICD-10-CM | POA: Diagnosis not present

## 2021-09-19 LAB — LACTIC ACID, PLASMA: Lactic Acid, Venous: 1 mmol/L (ref 0.5–1.9)

## 2021-09-19 LAB — COOXEMETRY PANEL
Carboxyhemoglobin: 1.6 % — ABNORMAL HIGH (ref 0.5–1.5)
Methemoglobin: <0.7 % (ref 0.0–1.5)
O2 Saturation: 68.6 %
Total hemoglobin: 10.1 g/dL — ABNORMAL LOW (ref 12.0–16.0)

## 2021-09-19 LAB — COMPREHENSIVE METABOLIC PANEL
ALT: 89 U/L — ABNORMAL HIGH (ref 0–44)
AST: 184 U/L — ABNORMAL HIGH (ref 15–41)
Albumin: 2.6 g/dL — ABNORMAL LOW (ref 3.5–5.0)
Alkaline Phosphatase: 71 U/L (ref 38–126)
Anion gap: 6 (ref 5–15)
BUN: 45 mg/dL — ABNORMAL HIGH (ref 8–23)
CO2: 25 mmol/L (ref 22–32)
Calcium: 8.4 mg/dL — ABNORMAL LOW (ref 8.9–10.3)
Chloride: 102 mmol/L (ref 98–111)
Creatinine, Ser: 1.8 mg/dL — ABNORMAL HIGH (ref 0.61–1.24)
GFR, Estimated: 37 mL/min — ABNORMAL LOW (ref >60)
Glucose, Bld: 132 mg/dL — ABNORMAL HIGH (ref 70–99)
Potassium: 3.9 mmol/L (ref 3.5–5.1)
Sodium: 133 mmol/L — ABNORMAL LOW (ref 135–145)
Total Bilirubin: 0.8 mg/dL (ref 0.3–1.2)
Total Protein: 5.4 g/dL — ABNORMAL LOW (ref 6.5–8.1)

## 2021-09-19 LAB — GLUCOSE, CAPILLARY
Glucose-Capillary: 124 mg/dL — ABNORMAL HIGH (ref 70–99)
Glucose-Capillary: 214 mg/dL — ABNORMAL HIGH (ref 70–99)
Glucose-Capillary: 251 mg/dL — ABNORMAL HIGH (ref 70–99)
Glucose-Capillary: 140 mg/dL — ABNORMAL HIGH (ref 70–99)

## 2021-09-19 LAB — CBC
HCT: 31.2 % — ABNORMAL LOW (ref 39.0–52.0)
Hemoglobin: 10.5 g/dL — ABNORMAL LOW (ref 13.0–17.0)
MCH: 31 pg (ref 26.0–34.0)
MCHC: 33.7 g/dL (ref 30.0–36.0)
MCV: 92 fL (ref 80.0–100.0)
Platelets: 284 10*3/uL (ref 150–400)
RBC: 3.39 MIL/uL — ABNORMAL LOW (ref 4.22–5.81)
RDW: 13.6 % (ref 11.5–15.5)
WBC: 17.4 10*3/uL — ABNORMAL HIGH (ref 4.0–10.5)
nRBC: 0 % (ref 0.0–0.2)

## 2021-09-19 LAB — LIPOPROTEIN A (LPA): Lipoprotein (a): 43.1 nmol/L — ABNORMAL HIGH (ref <75.0)

## 2021-09-19 MED ORDER — DIGOXIN 125 MCG PO TABS
0.0625 mg | ORAL_TABLET | Freq: Every day | ORAL | Status: DC
  Administered 2021-09-19 – 2021-09-25 (×7): 0.0625 mg via ORAL
  Filled 2021-09-19 (×7): qty 1

## 2021-09-19 MED ORDER — ALUM & MAG HYDROXIDE-SIMETH 200-200-20 MG/5ML PO SUSP
30.0000 mL | Freq: Once | ORAL | Status: AC
  Administered 2021-09-19: 30 mL via ORAL
  Filled 2021-09-19: qty 30

## 2021-09-19 MED ORDER — GLUCERNA SHAKE PO LIQD
237.0000 mL | Freq: Two times a day (BID) | ORAL | Status: DC
  Administered 2021-09-19 – 2021-09-25 (×9): 237 mL via ORAL

## 2021-09-19 NOTE — Progress Notes (Signed)
Advanced Heart Failure Rounding Note  PCP-Cardiologist: Sherren Mocha, MD   Subjective:    Milrinone switched to DBA yesterday. Now DBA 3 and NE 3. Co-ox 68%  Feels better. Denies SOB, orthopnea or PND  Swan #s done personally CVP 10 PA 26/17 PCWP 10 Thermo 4.9/2.7 PAPI 0.9 SVR 1161 PVR 2.0 WU   Objective:   Weight Range: 68 kg Body mass index is 22.81 kg/m.   Vital Signs:   Temp:  [97.9 F (36.6 C)-99.1 F (37.3 C)] 98.8 F (37.1 C) (09/02 0945) Pulse Rate:  [76-112] 101 (09/02 0945) Resp:  [16-32] 28 (09/02 0945) BP: (92-131)/(56-75) 131/75 (09/02 0400) SpO2:  [90 %-98 %] 96 % (09/02 0945) Arterial Line BP: (81-171)/(38-88) 138/52 (09/02 0945) Last BM Date : 09/17/21  Weight change: Filed Weights   09/17/21 1845  Weight: 68 kg    Intake/Output:   Intake/Output Summary (Last 24 hours) at 09/19/2021 0957 Last data filed at 09/19/2021 0800 Gross per 24 hour  Intake 1231.36 ml  Output 1350 ml  Net -118.64 ml      Physical Exam    General:  Sitting up in bed. No resp difficulty HEENT: Normal Neck: Supple. RIJ swan Carotids 2+ bilat; no bruits. No lymphadenopathy or thyromegaly appreciated. Cor: PMI nondisplaced. Regular rate & rhythm. No rubs, gallops or murmurs. Lungs: Clear Abdomen: Soft, nontender, nondistended. No hepatosplenomegaly. No bruits or masses. Good bowel sounds. Extremities: No cyanosis, clubbing, rash, edema Neuro: Alert & orientedx3, cranial nerves grossly intact. moves all 4 extremities w/o difficulty. Affect pleasant   Telemetry   Sinus 80-90s Personally reviewed   Labs    CBC Recent Labs    09/17/21 1858 09/17/21 2059 09/18/21 0235 09/18/21 0917 09/19/21 0451  WBC 23.5*  --  25.4*  --  17.4*  NEUTROABS 20.4*  --   --   --   --   HGB 12.8*   < > 11.9* 11.9* 10.5*  HCT 37.0*   < > 33.9* 35.0* 31.2*  MCV 91.1  --  89.2  --  92.0  PLT 411*  --  414*  --  284   < > = values in this interval not displayed.   Basic  Metabolic Panel Recent Labs    09/18/21 0235 09/18/21 0917 09/18/21 2123 09/19/21 0451  NA 128* 131*  --  133*  K 4.9 4.8 4.0 3.9  CL 100  --   --  102  CO2 20*  --   --  25  GLUCOSE 407*  --   --  132*  BUN 48*  --   --  45*  CREATININE 1.85*  --   --  1.80*  CALCIUM 8.1*  --   --  8.4*  MG 2.0  --  1.8  --    Liver Function Tests Recent Labs    09/18/21 0915 09/19/21 0451  AST 328* 184*  ALT 106* 89*  ALKPHOS 65 71  BILITOT 0.6 0.8  PROT 5.5* 5.4*  ALBUMIN 2.8* 2.6*   No results for input(s): "LIPASE", "AMYLASE" in the last 72 hours. Cardiac Enzymes No results for input(s): "CKTOTAL", "CKMB", "CKMBINDEX", "TROPONINI" in the last 72 hours.  BNP: BNP (last 3 results) No results for input(s): "BNP" in the last 8760 hours.  ProBNP (last 3 results) No results for input(s): "PROBNP" in the last 8760 hours.   D-Dimer No results for input(s): "DDIMER" in the last 72 hours. Hemoglobin A1C Recent Labs    09/17/21 1858  HGBA1C 9.5*   Fasting Lipid Panel Recent Labs    09/17/21 1858  CHOL 148  HDL 34*  LDLCALC 91  TRIG 113  CHOLHDL 4.4   Thyroid Function Tests No results for input(s): "TSH", "T4TOTAL", "T3FREE", "THYROIDAB" in the last 72 hours.  Invalid input(s): "FREET3"  Other results:   Imaging    No results found.   Medications:     Scheduled Medications:  arformoterol  15 mcg Nebulization BID   aspirin  81 mg Oral Daily   atorvastatin  80 mg Oral Daily   Chlorhexidine Gluconate Cloth  6 each Topical Q0600   dorzolamide  1 drop Both Eyes Daily   DULoxetine  30 mg Oral Daily   feeding supplement (GLUCERNA SHAKE)  237 mL Oral BID BM   heparin  5,000 Units Subcutaneous Q8H   insulin aspart  0-15 Units Subcutaneous TID WC   insulin detemir  5 Units Subcutaneous BID   latanoprost  1 drop Both Eyes QHS   midodrine  2.5 mg Oral TID WC   sodium chloride flush  3 mL Intravenous Q12H   ticagrelor  90 mg Oral BID    Infusions:  sodium  chloride 10 mL/hr at 09/19/21 0600   sodium chloride 5 mL/hr at 09/19/21 0600   sodium chloride 10 mL/hr at 09/19/21 0600   DOBUTamine 3 mcg/kg/min (09/19/21 0600)   norepinephrine (LEVOPHED) Adult infusion 3 mcg/min (09/19/21 0950)   sodium chloride Stopped (09/17/21 2252)    PRN Medications: sodium chloride, acetaminophen, HYDROcodone-acetaminophen, ondansetron (ZOFRAN) IV, mouth rinse, sodium chloride flush     Assessment/Plan   1. Cardiogenic shock due primarily to RV infarct in setting of late-presenting RCA STEMI - Outputs and BP improved on dobutamine/NE + midodrine  - Co-ox 68% - Echo EF 30-35% RV moderately reduced trivial MR - Continue current regimen Can wean NE slowly as tolerated - Keep CVP 8-12 - GDMT limited by shock/AKI/RV infarct. Consider spiro tomorrow    2. 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 AKI  - due to shock +/- CIN component - Baseline SCr 0.96 -> 1.65 -> 1.84 -> 1.80 - continue hemodynamic support    4. DM2, poorly controlled - HGBa1c 9.5% - cover SSI - DM2 consult - SGLT2i this admit   5. Shock liver - improving follow LFTs - continue hemodynamic support  CRITICAL CARE Performed by: Glori Bickers  Total critical care time: 35 minutes  Critical care time was exclusive of separately billable procedures and treating other patients.  Critical care was necessary to treat or prevent imminent or life-threatening deterioration.  Critical care was time spent personally by me (independent of midlevel providers or residents) on the following activities: development of treatment plan with patient and/or surrogate as well as nursing, discussions with consultants, evaluation of patient's response to treatment, examination  of patient, obtaining history from patient or surrogate, ordering and performing treatments and interventions, ordering and review of laboratory studies, ordering and review of radiographic studies, pulse oximetry and re-evaluation of patient's condition.  Length of Stay: 2  Glori Bickers, MD  09/19/2021, 9:57 AM  Advanced Heart Failure Team Pager [phone] (M-F; 7a - 5p)  Please contact [facility] for night-coverage after hours (5p -7a ) and weekends on [email or web address]

## 2021-09-19 NOTE — Progress Notes (Signed)
Rounding Note    Patient Name: Tommy Graham Date of Encounter: 09/19/2021  [place] Cardiologist: Sherren Mocha, MD   Subjective   81 year old male with hypertension hyperlipidemia uncontrolled diabetes with cardiogenic shock with right ventricular failure secondary to inferolateral STEMI with RV involvement.  Dr. Clayborne Dana advanced heart failure note reviewed.  Chest pain with yard work on 8/30.  [facility] 8/31 inferior lateral ST elevation.  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  EF about 30-35%.  Milrinone 0.375 originally utilized with Co. ox originally 39% increasing to 50%.  However transition to dobutamine/norepinephrine.  Also on midodrine.  Co. ox now 69%  Swan numbers yesterday showed the following:   RA 9-10 PA 23/16  PCWP 10 Thermo 3.0/1.7 PAPi = 0.7  Today right atrial pressures continue to be about 9-10.  Inflated balloon but catheter did not wedge.  PA diastolic 15.  Felt sensation of mild shortness of breath.  Incentive spirometer utilized.  3 L nasal cannula oxygen.  Also had some left thigh discomfort  Inpatient Medications    Scheduled Meds:  arformoterol  15 mcg Nebulization BID   aspirin  81 mg Oral Daily   atorvastatin  80 mg Oral Daily   Chlorhexidine Gluconate Cloth  6 each Topical Q0600   dorzolamide  1 drop Both Eyes Daily   DULoxetine  30 mg Oral Daily   feeding supplement (GLUCERNA SHAKE)  237 mL Oral BID BM   heparin  5,000 Units Subcutaneous Q8H   insulin aspart  0-15 Units Subcutaneous TID WC   insulin detemir  5 Units Subcutaneous BID   latanoprost  1 drop Both Eyes QHS   midodrine  2.5 mg Oral TID WC   sodium chloride flush  3 mL Intravenous Q12H   ticagrelor  90 mg Oral BID   Continuous Infusions:  sodium chloride 10 mL/hr at 09/19/21 0600   sodium chloride 5 mL/hr at 09/19/21 0600   sodium chloride 10 mL/hr at 09/19/21 0600   DOBUTamine 3 mcg/kg/min (09/19/21 0600)    norepinephrine (LEVOPHED) Adult infusion 5 mcg/min (09/19/21 0600)   sodium chloride Stopped (09/17/21 2252)   PRN Meds: sodium chloride, acetaminophen, HYDROcodone-acetaminophen, ondansetron (ZOFRAN) IV, mouth rinse, sodium chloride flush   Vital Signs    Vitals:   09/19/21 0600 09/19/21 0615 09/19/21 0630 09/19/21 0645  BP:      Pulse: 94 (!) 102 (!) 104 (!) 103  Resp: (!) 21 16 (!) 22 (!) 24  Temp: 98.8 F (37.1 C) 98.6 F (37 C) 98.6 F (37 C) 98.6 F (37 C)  TempSrc:      SpO2: 95% 95% 96% 95%  Weight:      Height:        Intake/Output Summary (Last 24 hours) at 09/19/2021 0758 Last data filed at 09/19/2021 0600 Gross per 24 hour  Intake 1408.87 ml  Output 1050 ml  Net 358.87 ml      09/17/2021    6:45 PM 02/02/2021   11:13 AM 02/02/2006    9:02 AM  Last 3 Weights  Weight (lbs) 150 lb 144 lb 15.9 oz 166 lb  Weight (kg) 68.04 kg 65.77 kg 75.297 kg      Telemetry    Sinus rhythm- Personally Reviewed  ECG    Persistent ST segment elevation inferiorly and laterally, sinus rhythm previously inferolateral ST elevation- Personally Reviewed  Physical Exam   GEN: No acute distress.  Elderly Neck: No JVD Cardiac: RRR, no  murmurs, rubs, or gallops.  Respiratory: Clear to auscultation bilaterally. GI: Soft, nontender, non-distended  MS: No edema; No deformity. Neuro:  Nonfocal  Psych: Normal affect   Labs    High Sensitivity Troponin:   Recent Labs  Lab 09/17/21 1858  TROPONINIHS >24,000*     Chemistry Recent Labs  Lab 09/17/21 1858 09/17/21 2059 09/18/21 0235 09/18/21 0915 09/18/21 0917 09/18/21 2123 09/19/21 0451  NA 127*   < > 128*  --  131*  --  133*  K 5.8*   < > 4.9  --  4.8 4.0 3.9  CL 93*  --  100  --   --   --  102  CO2 23  --  20*  --   --   --  25  GLUCOSE 456*  --  407*  --   --   --  132*  BUN 45*  --  48*  --   --   --  45*  CREATININE 1.64*  --  1.85*  --   --   --  1.80*  CALCIUM 8.9  --  8.1*  --   --   --  8.4*  MG  --   --   2.0  --   --  1.8  --   PROT 7.1  --   --  5.5*  --   --  5.4*  ALBUMIN 3.9  --   --  2.8*  --   --  2.6*  AST 268*  --   --  328*  --   --  184*  ALT 78*  --   --  106*  --   --  89*  ALKPHOS 85  --   --  65  --   --  71  BILITOT 1.0  --   --  0.6  --   --  0.8  GFRNONAA 42*  --  36*  --   --   --  37*  ANIONGAP 11  --  8  --   --   --  6   < > = values in this interval not displayed.    Lipids  Recent Labs  Lab 09/17/21 1858  CHOL 148  TRIG 113  HDL 34*  LDLCALC 91  CHOLHDL 4.4    Hematology Recent Labs  Lab 09/17/21 1858 09/17/21 2059 09/18/21 0235 09/18/21 0917 09/19/21 0451  WBC 23.5*  --  25.4*  --  17.4*  RBC 4.06*  --  3.80*  --  3.39*  HGB 12.8*   < > 11.9* 11.9* 10.5*  HCT 37.0*   < > 33.9* 35.0* 31.2*  MCV 91.1  --  89.2  --  92.0  MCH 31.5  --  31.3  --  31.0  MCHC 34.6  --  35.1  --  33.7  RDW 13.2  --  13.3  --  13.6  PLT 411*  --  414*  --  284   < > = values in this interval not displayed.   Thyroid No results for input(s): "TSH", "FREET4" in the last 168 hours.  BNPNo results for input(s): "BNP", "PROBNP" in the last 168 hours.  DDimer No results for input(s): "DDIMER" in the last 168 hours.   Radiology    ECHOCARDIOGRAM COMPLETE  Result Date: 09/18/2021    ECHOCARDIOGRAM REPORT   Patient Name:   Tommy Graham Date of Exam: 09/18/2021 Medical Rec #:  [id number]      Height:  68.0 in Accession #:    [id number]     Weight:       150.0 lb Date of Birth:  12-Feb-1940      BSA:          1.809 m Patient Age:    16 years       BP:           1222/70 mmHg Patient Gender: M              HR:           81 bpm. Exam Location:  Inpatient Procedure: 2D Echo, Cardiac Doppler, Color Doppler and Intracardiac            Opacification Agent Indications:    CHF-acute systolic  History:        Patient has no prior history of Echocardiogram examinations.                 Risk Factors:Hypertension, Diabetes and Dyslipidemia.  Sonographer:    Clayton Lefort RDCS (AE) Referring  Phys: [phone] [facility]  Sonographer Comments: Technically difficult study due to poor echo windows. IMPRESSIONS  1. Left ventricular ejection fraction, by estimation, is 30 to 35%. The left ventricle has moderately decreased function. The left ventricle demonstrates regional wall motion abnormalities (see scoring diagram/findings for description). There is mild left ventricular hypertrophy. Left ventricular diastolic parameters are consistent with Grade I diastolic dysfunction (impaired relaxation). RWMA difficult to assess despite the use of Definity contrast.  2. Right ventricular systolic function is moderately reduced. The right ventricular size is moderately enlarged. Tricuspid regurgitation signal is inadequate for assessing PA pressure.  3. The mitral valve is grossly normal. Trivial mitral valve regurgitation. No evidence of mitral stenosis.  4. The aortic valve was not well visualized but appears grossly normal. Aortic valve regurgitation is not visualized. No aortic stenosis is present.  5. The inferior vena cava is dilated in size with <50% respiratory variability, suggesting right atrial pressure of 15 mmHg. FINDINGS  Left Ventricle: Left ventricular ejection fraction, by estimation, is 30 to 35%. The left ventricle has moderately decreased function. The left ventricle demonstrates regional wall motion abnormalities. Definity contrast agent was given IV to delineate the left ventricular endocardial borders. The left ventricular internal cavity size was normal in size. There is mild left ventricular hypertrophy. Left ventricular diastolic parameters are consistent with Grade I diastolic dysfunction (impaired relaxation).  LV Wall Scoring: The antero-lateral wall, inferior wall, and posterior wall are akinetic. RWMA difficult to assess despite the use of Definity contrast. Right Ventricle: The right ventricular size is moderately enlarged. No increase in right ventricular wall thickness. Right  ventricular systolic function is moderately reduced. Tricuspid regurgitation signal is inadequate for assessing PA pressure. Left Atrium: Left atrial size was normal in size. Right Atrium: Right atrial size was normal in size. Pericardium: Trivial pericardial effusion is present. Mitral Valve: The mitral valve is grossly normal. Trivial mitral valve regurgitation. No evidence of mitral valve stenosis. Tricuspid Valve: The tricuspid valve is normal in structure. Tricuspid valve regurgitation is not demonstrated. No evidence of tricuspid stenosis. Aortic Valve: The aortic valve was not well visualized. Aortic valve regurgitation is not visualized. No aortic stenosis is present. Aortic valve mean gradient measures 1.0 mmHg. Aortic valve peak gradient measures 2.4 mmHg. Aortic valve area, by VTI measures 3.37 cm. Pulmonic Valve: The pulmonic valve was not well visualized. Pulmonic valve regurgitation is not visualized. No evidence of pulmonic stenosis. Aorta: The aortic root is  normal in size and structure. Venous: The inferior vena cava is dilated in size with less than 50% respiratory variability, suggesting right atrial pressure of 15 mmHg. IAS/Shunts: No atrial level shunt detected by color flow Doppler. Additional Comments: A venous catheter is visualized in the right ventricle.  LEFT VENTRICLE PLAX 2D LVIDd:         4.30 cm   Diastology LVIDs:         3.50 cm   LV e' medial:    4.03 cm/s LV PW:         1.10 cm   LV E/e' medial:  12.4 LV IVS:        1.10 cm   LV e' lateral:   4.24 cm/s LVOT diam:     2.30 cm   LV E/e' lateral: 11.7 LV SV:         45 LV SV Index:   25 LVOT Area:     4.15 cm  RIGHT VENTRICLE             IVC RV Basal diam:  3.20 cm     IVC diam: 2.20 cm RV S prime:     11.50 cm/s TAPSE (M-mode): 1.1 cm LEFT ATRIUM             Index        RIGHT ATRIUM           Index LA diam:        2.70 cm 1.49 cm/m   RA Area:     14.20 cm LA Vol (A2C):   42.5 ml 23.50 ml/m  RA Volume:   36.40 ml  20.13 ml/m LA  Vol (A4C):   26.6 ml 14.71 ml/m LA Biplane Vol: 33.7 ml 18.63 ml/m  AORTIC VALVE AV Area (Vmax):    3.83 cm AV Area (Vmean):   3.44 cm AV Area (VTI):     3.37 cm AV Vmax:           77.40 cm/s AV Vmean:          52.800 cm/s AV VTI:            0.133 m AV Peak Grad:      2.4 mmHg AV Mean Grad:      1.0 mmHg LVOT Vmax:         71.30 cm/s LVOT Vmean:        43.700 cm/s LVOT VTI:          0.108 m LVOT/AV VTI ratio: 0.81  AORTA Ao Root diam: 3.00 cm MITRAL VALVE MV Area (PHT): 3.36 cm    SHUNTS MV Decel Time: 226 msec    Systemic VTI:  0.11 m MV E velocity: 49.80 cm/s  Systemic Diam: 2.30 cm MV A velocity: 93.80 cm/s MV E/A ratio:  0.53 Cherlynn Kaiser MD Electronically signed by Cherlynn Kaiser MD Signature Date/Time: 09/18/2021/12:34:19 PM    Final    DG Chest Port 1 View  Result Date: 09/18/2021 CLINICAL DATA:  Manipulation of central venous catheter. EXAM: PORTABLE CHEST 1 VIEW COMPARISON:  September 18, 2021 FINDINGS: RIGHT IJ Swan-Ganz catheter is been advanced approximately 2 cm, tip overlies the RIGHT hilum in the area of the RIGHT main or inter lobar pulmonary artery. EKG leads project over the chest. Signs of spinal fusion partially visualized over the lower cervical spine. Trachea midline. Cardiomediastinal contours and hilar structures are normal. Lungs are clear.  No visible pneumothorax. On limited assessment there is no acute skeletal process.  IMPRESSION: RIGHT IJ Swan-Ganz catheter is been advanced approximately 2 cm, tip overlies the RIGHT hilum in the area of the RIGHT main or inter lobar pulmonary artery. No acute cardiopulmonary disease. Electronically Signed   By: Zetta Bills M.D.   On: 09/18/2021 08:40   DG CHEST PORT 1 VIEW  Result Date: 09/18/2021 CLINICAL DATA:  Post stemi, line placement EXAM: PORTABLE CHEST - 1 VIEW COMPARISON:  the previous day's study FINDINGS: Right IJ Swan-Ganz to the proximal right pulmonary artery. No pneumothorax. Relatively low lung volumes with mild hazy  opacity over both lung bases. Heart size and mediastinal contours are within normal limits. No effusion. Cervical fixation hardware partially visualized. IMPRESSION: Central line to the proximal right pulmonary artery without pneumothorax. Electronically Signed   By: Lucrezia Europe M.D.   On: 09/18/2021 08:35   CARDIAC CATHETERIZATION  Result Date: 09/17/2021   Prox RCA lesion is 100% stenosed.   Mid LM to Ost LAD lesion is 80% stenosed.   Mid LAD lesion is 50% stenosed.   Dist LAD lesion is 80% stenosed.   A drug-eluting stent was successfully placed using a SYNERGY XD [id number].   Post intervention, there is a 0% residual stenosis.   There is moderate left ventricular systolic dysfunction.   LV end diastolic pressure is moderately elevated. 1.  Acute inferolateral STEMI complicated by RV infarction and cardiogenic shock with thrombotic occlusion of the proximal RCA treated with PTCA, aspiration thrombectomy, and drug-eluting stent implantation 2.  Severe distal left main and ostial LAD stenosis of 75% 3.  Patent left circumflex 4.  Probable moderate LV systolic dysfunction with hypokinesis of the inferior wall, but LV poorly visualized with hand-injection ventriculography 5.  Cardiogenic shock (RV) requiring vasopressor therapy 6.  Placement of right IJ introducer and Swan-Ganz catheter for invasive hemodynamic monitoring 7.  Placement of left radial arterial line for invasive blood pressure monitoring Recommendations: Continue volume resuscitation and Levophed for hemodynamic support.  The patient has stabilized with the above measures.  His hemodynamics are consistent with RV infarction/failure with elevated RA pressure of 21 mmHg, cardiac index of 1.96, and very low PA pulsatility.  As long as he recovers well with ICU care and supportive measures, we will need to determine best revascularization strategy for his left main/proximal LAD disease.  Considering the small size of the circumflex, PCI might be  appropriate.  Family updated on patient's condition and issues outlined above.   DG Chest Port 1 View  Result Date: 09/17/2021 CLINICAL DATA:  Dizziness. EXAM: PORTABLE CHEST 1 VIEW COMPARISON:  December 26, 2014 FINDINGS: The heart size and mediastinal contours are within normal limits. There is no evidence of acute infiltrate, pleural effusion or pneumothorax. A radiopaque fusion plate and screws are seen overlying the lower cervical spine. Multilevel degenerative changes seen throughout the thoracic spine. IMPRESSION: No active cardiopulmonary disease. Electronically Signed   By: Virgina Norfolk M.D.   On: 09/17/2021 19:24    Cardiac Studies   Cath 1.  Acute inferolateral STEMI complicated by RV infarction and cardiogenic shock with thrombotic occlusion of the proximal RCA treated with PTCA, aspiration thrombectomy, and drug-eluting stent implantation 2.  Severe distal left main and ostial LAD stenosis of 75% 3.  Patent left circumflex 4.  Probable moderate LV systolic dysfunction with hypokinesis of the inferior wall, but LV poorly visualized with hand-injection ventriculography 5.  Cardiogenic shock (RV) requiring vasopressor therapy 6.  Placement of right IJ introducer and Swan-Ganz catheter for invasive hemodynamic monitoring  7.  Placement of left radial arterial line for invasive blood pressure monitoring  Diagnostic Dominance: Right  Intervention    ECHO 09/18/21:   1. Left ventricular ejection fraction, by estimation, is 30 to 35%. The  left ventricle has moderately decreased function. The left ventricle  demonstrates regional wall motion abnormalities (see scoring  diagram/findings for description). There is mild  left ventricular hypertrophy. Left ventricular diastolic parameters are  consistent with Grade I diastolic dysfunction (impaired relaxation). RWMA  difficult to assess despite the use of Definity contrast.   2. Right ventricular systolic function is moderately  reduced. The right  ventricular size is moderately enlarged. Tricuspid regurgitation signal is  inadequate for assessing PA pressure.   3. The mitral valve is grossly normal. Trivial mitral valve  regurgitation. No evidence of mitral stenosis.   4. The aortic valve was not well visualized but appears grossly normal.  Aortic valve regurgitation is not visualized. No aortic stenosis is  present.   5. The inferior vena cava is dilated in size with <50% respiratory  variability, suggesting right atrial pressure of 15 mmHg.    Assessment & Plan    Inferolateral ST elevation myocardial infarction with cardiogenic shock secondary to right ventricular infarct in the setting of late presenting RCA STEMI status post proximal RCA stent with residual 75 to 80% distal left main ostial LAD as well as 80% distal LAD -On dobutamine (utilized secondary to RV involvement) and norepinephrine with Co. ox at 4:50 AM 69%, markedly improved.  Norepinephrine has been gently decreased overnight.  Continue to wean.  Blood pressures via ABG are in the 993Z systolic. -Emergent PCI to RCA on 8/31-cath report reviewed as above -Continuing with dual antiplatelet therapy, high intensity statin -Plan will be for staged distal left main/LAD PCI following current recovery perhaps weeks ahead. -Wedge numbers seem fairly stable.  May be able to discontinue Swan today or tomorrow.  Acute kidney injury - Cardiogenic shock component, creatinine at baseline 0.96, slightly improved this morning to 1.8 down from 1.85.  Continue with perfusion.  Avoid nephrotoxic agents.  Diabetes with coronary artery disease hyperlipidemia, hypertension -Hemoglobin A1c 9.5 -Eventually SGLT2 inhibitor this admission.  Shock liver - LFTs improving.  Perfusion.  Rare PVCs - Magnesium supplemented.  Discussed with nursing team as well as wife and patient  CRITICAL CARE Performed by: Candee Furbish   Total critical care time: 35  minutes  Critical care time was exclusive of separately billable procedures and treating other patients.  Critical care was necessary to treat or prevent imminent or life-threatening deterioration.  Critical care was time spent personally by me on the following activities: development of treatment plan with patient and/or surrogate as well as nursing, discussions with consultants, evaluation of patient's response to treatment, examination of patient, obtaining history from patient or surrogate, ordering and performing treatments and interventions, ordering and review of laboratory studies, ordering and review of radiographic studies, pulse oximetry and re-evaluation of patient's condition.   For questions or updates, please contact [place] Please consult [email or web address] for contact info under        Signed, Candee Furbish, MD  09/19/2021, 7:58 AM

## 2021-09-19 NOTE — Plan of Care (Signed)
  Problem: Clinical Measurements: Goal: Ability to maintain clinical measurements within normal limits will improve Outcome: Progressing Goal: Will remain free from infection Outcome: Progressing Goal: Diagnostic test results will improve Outcome: Progressing Goal: Respiratory complications will improve Outcome: Progressing Goal: Cardiovascular complication will be avoided Outcome: Progressing   Problem: Activity: Goal: Risk for activity intolerance will decrease Outcome: Progressing   Problem: Nutrition: Goal: Adequate nutrition will be maintained Outcome: Not Progressing Note: Poor appetite, doesn't eat much protein options, needs encouragement   Problem: Coping: Goal: Level of anxiety will decrease Outcome: Progressing   Problem: Elimination: Goal: Will not experience complications related to urinary retention Outcome: Progressing   Problem: Pain Managment: Goal: General experience of comfort will improve Outcome: Progressing   Problem: Safety: Goal: Ability to remain free from injury will improve Outcome: Progressing   Problem: Skin Integrity: Goal: Risk for impaired skin integrity will decrease Outcome: Progressing   Problem: Health Behavior/Discharge Planning: Goal: Ability to manage health-related needs will improve Outcome: Progressing

## 2021-09-20 ENCOUNTER — Inpatient Hospital Stay: Payer: Self-pay

## 2021-09-20 DIAGNOSIS — I2111 ST elevation (STEMI) myocardial infarction involving right coronary artery: Secondary | ICD-10-CM | POA: Diagnosis not present

## 2021-09-20 LAB — COMPREHENSIVE METABOLIC PANEL
ALT: 74 U/L — ABNORMAL HIGH (ref 0–44)
AST: 84 U/L — ABNORMAL HIGH (ref 15–41)
Albumin: 2.3 g/dL — ABNORMAL LOW (ref 3.5–5.0)
Alkaline Phosphatase: 82 U/L (ref 38–126)
Anion gap: 7 (ref 5–15)
BUN: 33 mg/dL — ABNORMAL HIGH (ref 8–23)
CO2: 24 mmol/L (ref 22–32)
Calcium: 8.3 mg/dL — ABNORMAL LOW (ref 8.9–10.3)
Chloride: 103 mmol/L (ref 98–111)
Creatinine, Ser: 1.31 mg/dL — ABNORMAL HIGH (ref 0.61–1.24)
GFR, Estimated: 55 mL/min — ABNORMAL LOW (ref >60)
Glucose, Bld: 123 mg/dL — ABNORMAL HIGH (ref 70–99)
Potassium: 3.9 mmol/L (ref 3.5–5.1)
Sodium: 134 mmol/L — ABNORMAL LOW (ref 135–145)
Total Bilirubin: 0.6 mg/dL (ref 0.3–1.2)
Total Protein: 5.3 g/dL — ABNORMAL LOW (ref 6.5–8.1)

## 2021-09-20 LAB — COOXEMETRY PANEL
Carboxyhemoglobin: 1.9 % — ABNORMAL HIGH (ref 0.5–1.5)
Methemoglobin: 2 % — ABNORMAL HIGH (ref 0.0–1.5)
O2 Saturation: 68 %
Total hemoglobin: 10.1 g/dL — ABNORMAL LOW (ref 12.0–16.0)

## 2021-09-20 LAB — GLUCOSE, CAPILLARY
Glucose-Capillary: 111 mg/dL — ABNORMAL HIGH (ref 70–99)
Glucose-Capillary: 177 mg/dL — ABNORMAL HIGH (ref 70–99)
Glucose-Capillary: 224 mg/dL — ABNORMAL HIGH (ref 70–99)

## 2021-09-20 MED ORDER — SORBITOL 70 % SOLN
30.0000 mL | Freq: Once | Status: AC
  Administered 2021-09-20: 30 mL via ORAL
  Filled 2021-09-20: qty 30

## 2021-09-20 MED ORDER — DOCUSATE SODIUM 100 MG PO CAPS
100.0000 mg | ORAL_CAPSULE | Freq: Every day | ORAL | Status: DC
  Administered 2021-09-20 – 2021-09-25 (×6): 100 mg via ORAL
  Filled 2021-09-20 (×6): qty 1

## 2021-09-20 MED ORDER — SENNA 8.6 MG PO TABS
1.0000 | ORAL_TABLET | Freq: Two times a day (BID) | ORAL | Status: DC
  Administered 2021-09-20 – 2021-09-25 (×11): 8.6 mg via ORAL
  Filled 2021-09-20 (×12): qty 1

## 2021-09-20 MED ORDER — ALUM & MAG HYDROXIDE-SIMETH 200-200-20 MG/5ML PO SUSP
30.0000 mL | ORAL | Status: DC | PRN
Start: 2021-09-20 — End: 2021-09-25
  Administered 2021-09-20: 30 mL via ORAL
  Filled 2021-09-20: qty 30

## 2021-09-20 NOTE — Progress Notes (Signed)
Rounding Note    Patient Name: Tommy Graham Date of Encounter: 09/20/2021   HeartCare Cardiologist: Sherren Mocha, MD   Subjective   81 year old male with hypertension hyperlipidemia uncontrolled diabetes with cardiogenic shock with right ventricular failure secondary to inferolateral STEMI with RV involvement.  Dr. Clayborne Dana advanced heart failure note reviewed.  Chest pain with yard work on 8/30.  [facility] 8/31 inferior lateral ST elevation.  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  EF about 30-35%.  Milrinone 0.375 originally utilized with Co. ox originally 39% increasing to 50%.  However transition to dobutamine/norepinephrine.  Also on midodrine.  Co. ox now 68%  Wife at bedside.  Feeling a little bit better.  No chest pain.  Inpatient Medications    Scheduled Meds:  arformoterol  15 mcg Nebulization BID   aspirin  81 mg Oral Daily   atorvastatin  80 mg Oral Daily   Chlorhexidine Gluconate Cloth  6 each Topical Q0600   digoxin  0.0625 mg Oral Daily   docusate sodium  100 mg Oral Daily   dorzolamide  1 drop Both Eyes Daily   DULoxetine  30 mg Oral Daily   feeding supplement (GLUCERNA SHAKE)  237 mL Oral BID BM   heparin  5,000 Units Subcutaneous Q8H   insulin aspart  0-15 Units Subcutaneous TID WC   insulin detemir  5 Units Subcutaneous BID   latanoprost  1 drop Both Eyes QHS   senna  1 tablet Oral BID   sodium chloride flush  3 mL Intravenous Q12H   sorbitol  30 mL Oral Once   ticagrelor  90 mg Oral BID   Continuous Infusions:  sodium chloride 10 mL/hr at 09/20/21 0900   sodium chloride 5 mL/hr at 09/20/21 0900   sodium chloride 10 mL/hr at 09/20/21 0900   DOBUTamine 3 mcg/kg/min (09/20/21 0900)   norepinephrine (LEVOPHED) Adult infusion Stopped (09/19/21 1257)   sodium chloride Stopped (09/17/21 2252)   PRN Meds: sodium chloride, acetaminophen, HYDROcodone-acetaminophen, ondansetron (ZOFRAN) IV, mouth rinse,  sodium chloride flush   Vital Signs    Vitals:   09/20/21 0742 09/20/21 0800 09/20/21 0813 09/20/21 0900  BP:  133/74  (!) 113/55  Pulse: 100 (!) 108 100 (!) 101  Resp: 20 19 (!) 24 (!) 22  Temp: (!) 97.3 F (36.3 C) 98.8 F (37.1 C) 98.8 F (37.1 C) 98.6 F (37 C)  TempSrc: Oral     SpO2: 95% 96% 95% 95%  Weight:      Height:        Intake/Output Summary (Last 24 hours) at 09/20/2021 1034 Last data filed at 09/20/2021 0900 Gross per 24 hour  Intake 1226.62 ml  Output 1600 ml  Net -373.38 ml      09/17/2021    6:45 PM 02/02/2021   11:13 AM 02/02/2006    9:02 AM  Last 3 Weights  Weight (lbs) 150 lb 144 lb 15.9 oz 166 lb  Weight (kg) 68.04 kg 65.77 kg 75.297 kg      Telemetry    Sinus rhythm/sinus tachycardia mild, no adverse arrhythmias- Personally Reviewed  ECG    Persistent ST segment elevation inferiorly and laterally, sinus rhythm previously inferolateral ST elevation- Personally Reviewed  Physical Exam   GEN: No acute distress.  Comfortable in bed Neck: No JVD Cardiac: RRR, no murmurs, rubs, or gallops.  Swan catheter in place Respiratory: Clear to auscultation bilaterally. GI: Soft, nontender, non-distended  MS: No edema; No deformity.  Neuro:  Nonfocal  Psych: Normal affect   Labs    High Sensitivity Troponin:   Recent Labs  Lab 09/17/21 1858  TROPONINIHS >24,000*     Chemistry Recent Labs  Lab 09/18/21 0235 09/18/21 0915 09/18/21 0917 09/18/21 2123 09/19/21 0451 09/20/21 0541  NA 128*  --  131*  --  133* 134*  K 4.9  --  4.8 4.0 3.9 3.9  CL 100  --   --   --  102 103  CO2 20*  --   --   --  25 24  GLUCOSE 407*  --   --   --  132* 123*  BUN 48*  --   --   --  45* 33*  CREATININE 1.85*  --   --   --  1.80* 1.31*  CALCIUM 8.1*  --   --   --  8.4* 8.3*  MG 2.0  --   --  1.8  --   --   PROT  --  5.5*  --   --  5.4* 5.3*  ALBUMIN  --  2.8*  --   --  2.6* 2.3*  AST  --  328*  --   --  184* 84*  ALT  --  106*  --   --  89* 74*  ALKPHOS  --   65  --   --  71 82  BILITOT  --  0.6  --   --  0.8 0.6  GFRNONAA 36*  --   --   --  37* 55*  ANIONGAP 8  --   --   --  6 7    Lipids  Recent Labs  Lab 09/17/21 1858  CHOL 148  TRIG 113  HDL 34*  LDLCALC 91  CHOLHDL 4.4    Hematology Recent Labs  Lab 09/17/21 1858 09/17/21 2059 09/18/21 0235 09/18/21 0917 09/19/21 0451  WBC 23.5*  --  25.4*  --  17.4*  RBC 4.06*  --  3.80*  --  3.39*  HGB 12.8*   < > 11.9* 11.9* 10.5*  HCT 37.0*   < > 33.9* 35.0* 31.2*  MCV 91.1  --  89.2  --  92.0  MCH 31.5  --  31.3  --  31.0  MCHC 34.6  --  35.1  --  33.7  RDW 13.2  --  13.3  --  13.6  PLT 411*  --  414*  --  284   < > = values in this interval not displayed.   Thyroid No results for input(s): "TSH", "FREET4" in the last 168 hours.  BNPNo results for input(s): "BNP", "PROBNP" in the last 168 hours.  DDimer No results for input(s): "DDIMER" in the last 168 hours.   Radiology    [place] EKG SITE RITE  Result Date: 09/20/2021 If Site Rite image not attached, placement could not be confirmed due to current cardiac rhythm.   Cardiac Studies   Cath 1.  Acute inferolateral STEMI complicated by RV infarction and cardiogenic shock with thrombotic occlusion of the proximal RCA treated with PTCA, aspiration thrombectomy, and drug-eluting stent implantation 2.  Severe distal left main and ostial LAD stenosis of 75% 3.  Patent left circumflex 4.  Probable moderate LV systolic dysfunction with hypokinesis of the inferior wall, but LV poorly visualized with hand-injection ventriculography 5.  Cardiogenic shock (RV) requiring vasopressor therapy 6.  Placement of right IJ introducer and Swan-Ganz catheter for invasive hemodynamic monitoring 7.  Placement  of left radial arterial line for invasive blood pressure monitoring  Diagnostic Dominance: Right  Intervention    ECHO 09/18/21:   1. Left ventricular ejection fraction, by estimation, is 30 to 35%. The  left ventricle has moderately  decreased function. The left ventricle  demonstrates regional wall motion abnormalities (see scoring  diagram/findings for description). There is mild  left ventricular hypertrophy. Left ventricular diastolic parameters are  consistent with Grade I diastolic dysfunction (impaired relaxation). RWMA  difficult to assess despite the use of Definity contrast.   2. Right ventricular systolic function is moderately reduced. The right  ventricular size is moderately enlarged. Tricuspid regurgitation signal is  inadequate for assessing PA pressure.   3. The mitral valve is grossly normal. Trivial mitral valve  regurgitation. No evidence of mitral stenosis.   4. The aortic valve was not well visualized but appears grossly normal.  Aortic valve regurgitation is not visualized. No aortic stenosis is  present.   5. The inferior vena cava is dilated in size with <50% respiratory  variability, suggesting right atrial pressure of 15 mmHg.    Assessment & Plan    Inferolateral ST elevation myocardial infarction with cardiogenic shock secondary to right ventricular infarct in the setting of late presenting RCA STEMI status post proximal RCA stent with residual 75 to 80% distal left main ostial LAD as well as 80% distal LAD -On dobutamine 3 mcg (utilized secondary to RV involvement).  norepinephrine has now been discontinued - Co. ox 68%, markedly improved.   -Emergent PCI to RCA on 8/31-cath report reviewed as above -Continuing with dual antiplatelet therapy, high intensity statin -Plan will be for staged distal left main/LAD PCI following current recovery perhaps weeks ahead. -May be able to discontinue Swan today or tomorrow.  Acute kidney injury - Cardiogenic shock component, creatinine at baseline 0.96, slightly improved this morning to 1.31 from 1.8 down from 1.85.  Continue with perfusion.  Avoid nephrotoxic agents.  Diabetes with coronary artery disease hyperlipidemia, hypertension -Hemoglobin  A1c 9.5 -Eventually SGLT2 inhibitor this admission.  Shock liver - LFTs improving.  Perfusion.  Rare PVCs - Magnesium supplemented.  Discussed with nursing team as well as wife and patient Appreciate Dr. Haroldine Laws with advanced heart failure team seeing as well.  CRITICAL CARE Performed by: Candee Furbish   Total critical care time: 35 minutes  Critical care time was exclusive of separately billable procedures and treating other patients.  Critical care was necessary to treat or prevent imminent or life-threatening deterioration.  Critical care was time spent personally by me on the following activities: development of treatment plan with patient and/or surrogate as well as nursing, discussions with consultants, evaluation of patient's response to treatment, examination of patient, obtaining history from patient or surrogate, ordering and performing treatments and interventions, ordering and review of laboratory studies, ordering and review of radiographic studies, pulse oximetry and re-evaluation of patient's condition.   For questions or updates, please contact [place] Please consult [email or web address] for contact info under        Signed, Candee Furbish, MD  09/20/2021, 10:34 AM

## 2021-09-20 NOTE — Progress Notes (Signed)
PICC order received, per Radene Journey RN: Altha Harm was notified line will be placed on 09/21/21.

## 2021-09-20 NOTE — Progress Notes (Addendum)
Advanced Heart Failure Rounding Note  PCP-Cardiologist: Sherren Mocha, MD   Subjective:    Now on DBA 3 & midodrine 2.5 tid. Off NE. Co-ox 68%  Feels ok. Denies CP or SOB. Only complaint is lower abdominal fullness. No BM since admit    Swan #s done personally CVP 7 PA 26/17 PCWP 12 Thermo 4.5/2.5 PAPI 1.3 SVR 1433 PVR 2.1 WU   Objective:   Weight Range: 68 kg Body mass index is 22.81 kg/m.   Vital Signs:   Temp:  [97.3 F (36.3 C)-99.7 F (37.6 C)] 98 F (36.7 C) (09/03 1101) Pulse Rate:  [77-114] 91 (09/03 1049) Resp:  [15-32] 21 (09/03 1049) BP: (105-135)/(55-84) 134/74 (09/03 1000) SpO2:  [94 %-99 %] 97 % (09/03 1049) Arterial Line BP: (92-171)/(44-76) 130/60 (09/03 1049) Last BM Date : 09/17/21  Weight change: Filed Weights   09/17/21 1845  Weight: 68 kg    Intake/Output:   Intake/Output Summary (Last 24 hours) at 09/20/2021 1120 Last data filed at 09/20/2021 0900 Gross per 24 hour  Intake 1206.98 ml  Output 1600 ml  Net -393.02 ml       Physical Exam    General:  Sitting up in bed. No resp difficulty HEENT: normal Neck: supple. RIJ swan Carotids 2+ bilat; no bruits. No lymphadenopathy or thryomegaly appreciated. Cor: PMI nondisplaced. Regular rate & rhythm. No rubs, gallops or murmurs. Lungs: clear Abdomen: soft, nontender, mildy distended. No hepatosplenomegaly. No bruits or masses. Good bowel sounds. Extremities: no cyanosis, clubbing, rash, edema Neuro: alert & orientedx3, cranial nerves grossly intact. moves all 4 extremities w/o difficulty. Affect pleasant   Telemetry   Sinus 90s Personally reviewed   Labs    CBC Recent Labs    09/17/21 1858 09/17/21 2059 09/18/21 0235 09/18/21 0917 09/19/21 0451  WBC 23.5*  --  25.4*  --  17.4*  NEUTROABS 20.4*  --   --   --   --   HGB 12.8*   < > 11.9* 11.9* 10.5*  HCT 37.0*   < > 33.9* 35.0* 31.2*  MCV 91.1  --  89.2  --  92.0  PLT 411*  --  414*  --  284   < > = values in this  interval not displayed.    Basic Metabolic Panel Recent Labs    09/18/21 0235 09/18/21 0917 09/18/21 2123 09/19/21 0451 09/20/21 0541  NA 128*   < >  --  133* 134*  K 4.9   < > 4.0 3.9 3.9  CL 100  --   --  102 103  CO2 20*  --   --  25 24  GLUCOSE 407*  --   --  132* 123*  BUN 48*  --   --  45* 33*  CREATININE 1.85*  --   --  1.80* 1.31*  CALCIUM 8.1*  --   --  8.4* 8.3*  MG 2.0  --  1.8  --   --    < > = values in this interval not displayed.    Liver Function Tests Recent Labs    09/19/21 0451 09/20/21 0541  AST 184* 84*  ALT 89* 74*  ALKPHOS 71 82  BILITOT 0.8 0.6  PROT 5.4* 5.3*  ALBUMIN 2.6* 2.3*    No results for input(s): "LIPASE", "AMYLASE" in the last 72 hours. Cardiac Enzymes No results for input(s): "CKTOTAL", "CKMB", "CKMBINDEX", "TROPONINI" in the last 72 hours.  BNP: BNP (last 3 results) No results for  input(s): "BNP" in the last 8760 hours.  ProBNP (last 3 results) No results for input(s): "PROBNP" in the last 8760 hours.   D-Dimer No results for input(s): "DDIMER" in the last 72 hours. Hemoglobin A1C Recent Labs    09/17/21 1858  HGBA1C 9.5*    Fasting Lipid Panel Recent Labs    09/17/21 1858  CHOL 148  HDL 34*  LDLCALC 91  TRIG 113  CHOLHDL 4.4    Thyroid Function Tests No results for input(s): "TSH", "T4TOTAL", "T3FREE", "THYROIDAB" in the last 72 hours.  Invalid input(s): "FREET3"  Other results:   Imaging    [place] EKG SITE RITE  Result Date: 09/20/2021 If Site Rite image not attached, placement could not be confirmed due to current cardiac rhythm.    Medications:     Scheduled Medications:  arformoterol  15 mcg Nebulization BID   aspirin  81 mg Oral Daily   atorvastatin  80 mg Oral Daily   Chlorhexidine Gluconate Cloth  6 each Topical Q0600   digoxin  0.0625 mg Oral Daily   docusate sodium  100 mg Oral Daily   dorzolamide  1 drop Both Eyes Daily   DULoxetine  30 mg Oral Daily   feeding supplement  (GLUCERNA SHAKE)  237 mL Oral BID BM   heparin  5,000 Units Subcutaneous Q8H   insulin aspart  0-15 Units Subcutaneous TID WC   insulin detemir  5 Units Subcutaneous BID   latanoprost  1 drop Both Eyes QHS   senna  1 tablet Oral BID   sodium chloride flush  3 mL Intravenous Q12H   ticagrelor  90 mg Oral BID    Infusions:  sodium chloride 10 mL/hr at 09/20/21 0900   sodium chloride 5 mL/hr at 09/20/21 0900   sodium chloride 10 mL/hr at 09/20/21 0900   DOBUTamine 3 mcg/kg/min (09/20/21 0900)   norepinephrine (LEVOPHED) Adult infusion Stopped (09/19/21 1257)   sodium chloride Stopped (09/17/21 2252)    PRN Medications: sodium chloride, acetaminophen, HYDROcodone-acetaminophen, ondansetron (ZOFRAN) IV, mouth rinse, sodium chloride flush     Assessment/Plan   1. Cardiogenic shock due primarily to RV infarct in setting of late-presenting RCA STEMI - Outputs and BP improved on dobutamine 3 + midodrine. Off NE - Co-ox 68% PAP 1.3 (RV improving slowly) - Echo EF 30-35% RV moderately reduced trivial MR - Keep CVP 8-12 - GDMT limited by shock/AKI/RV infarct. Consider spiro tomorrow  - Can pull swan once PICC placed - Stop midodrine today  - CR consult to see   2. 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 AKI  - due to shock +/- CIN component - Baseline SCr 0.96 -> 1.65 -> 1.84 -> 1.80 -> 1.3 - continue hemodynamic support    4. DM2, poorly controlled - HGBa1c 9.5% - cover SSI - DM2 consult - SGLT2i this admit but waiting until RV improves so as not to drop preload   5. Shock liver - improving follow LFTs - continue hemodynamic support  CRITICAL CARE Performed by: Glori Bickers  Total critical care time: 35 minutes  Critical care time was  exclusive of separately billable procedures and treating other patients.  Critical care was necessary to treat or prevent imminent or life-threatening deterioration.  Critical care was time spent personally  by me (independent of midlevel providers or residents) on the following activities: development of treatment plan with patient and/or surrogate as well as nursing, discussions with consultants, evaluation of patient's response to treatment, examination of patient, obtaining history from patient or surrogate, ordering and performing treatments and interventions, ordering and review of laboratory studies, ordering and review of radiographic studies, pulse oximetry and re-evaluation of patient's condition.  Length of Stay: 3  Glori Bickers, MD  09/20/2021, 11:20 AM  Advanced Heart Failure Team Pager [phone] (M-F; 7a - 5p)  Please contact [facility] for night-coverage after hours (5p -7a ) and weekends on [email or web address]

## 2021-09-21 DIAGNOSIS — I2111 ST elevation (STEMI) myocardial infarction involving right coronary artery: Secondary | ICD-10-CM | POA: Diagnosis not present

## 2021-09-21 LAB — COMPREHENSIVE METABOLIC PANEL
ALT: 69 U/L — ABNORMAL HIGH (ref 0–44)
AST: 57 U/L — ABNORMAL HIGH (ref 15–41)
Albumin: 2.3 g/dL — ABNORMAL LOW (ref 3.5–5.0)
Alkaline Phosphatase: 103 U/L (ref 38–126)
Anion gap: 8 (ref 5–15)
BUN: 23 mg/dL (ref 8–23)
CO2: 22 mmol/L (ref 22–32)
Calcium: 8.4 mg/dL — ABNORMAL LOW (ref 8.9–10.3)
Chloride: 104 mmol/L (ref 98–111)
Creatinine, Ser: 1.25 mg/dL — ABNORMAL HIGH (ref 0.61–1.24)
GFR, Estimated: 58 mL/min — ABNORMAL LOW (ref >60)
Glucose, Bld: 150 mg/dL — ABNORMAL HIGH (ref 70–99)
Potassium: 4 mmol/L (ref 3.5–5.1)
Sodium: 134 mmol/L — ABNORMAL LOW (ref 135–145)
Total Bilirubin: 0.6 mg/dL (ref 0.3–1.2)
Total Protein: 5.5 g/dL — ABNORMAL LOW (ref 6.5–8.1)

## 2021-09-21 LAB — COOXEMETRY PANEL
Carboxyhemoglobin: 1.1 % (ref 0.5–1.5)
Carboxyhemoglobin: 1 % (ref 0.5–1.5)
Methemoglobin: <0.7 % (ref 0.0–1.5)
Methemoglobin: <0.7 % (ref 0.0–1.5)
O2 Saturation: 53.8 %
O2 Saturation: 52.8 %
Total hemoglobin: 9.7 g/dL — ABNORMAL LOW (ref 12.0–16.0)
Total hemoglobin: 13.3 g/dL (ref 12.0–16.0)

## 2021-09-21 LAB — CBC
HCT: 28.6 % — ABNORMAL LOW (ref 39.0–52.0)
Hemoglobin: 9.6 g/dL — ABNORMAL LOW (ref 13.0–17.0)
MCH: 31 pg (ref 26.0–34.0)
MCHC: 33.6 g/dL (ref 30.0–36.0)
MCV: 92.3 fL (ref 80.0–100.0)
Platelets: 239 10*3/uL (ref 150–400)
RBC: 3.1 MIL/uL — ABNORMAL LOW (ref 4.22–5.81)
RDW: 13.9 % (ref 11.5–15.5)
WBC: 7.6 10*3/uL (ref 4.0–10.5)
nRBC: 0 % (ref 0.0–0.2)

## 2021-09-21 LAB — GLUCOSE, CAPILLARY
Glucose-Capillary: 163 mg/dL — ABNORMAL HIGH (ref 70–99)
Glucose-Capillary: 221 mg/dL — ABNORMAL HIGH (ref 70–99)
Glucose-Capillary: 291 mg/dL — ABNORMAL HIGH (ref 70–99)
Glucose-Capillary: 156 mg/dL — ABNORMAL HIGH (ref 70–99)

## 2021-09-21 LAB — HEPARIN LEVEL (UNFRACTIONATED): Heparin Unfractionated: 0.29 IU/mL — ABNORMAL LOW (ref 0.30–0.70)

## 2021-09-21 MED ORDER — SODIUM CHLORIDE 0.9% FLUSH
10.0000 mL | Freq: Two times a day (BID) | INTRAVENOUS | Status: DC
  Administered 2021-09-21: 20 mL
  Administered 2021-09-21 – 2021-09-25 (×8): 10 mL

## 2021-09-21 MED ORDER — SPIRONOLACTONE 12.5 MG HALF TABLET
12.5000 mg | ORAL_TABLET | Freq: Every day | ORAL | Status: DC
  Administered 2021-09-21 – 2021-09-22 (×2): 12.5 mg via ORAL
  Filled 2021-09-21 (×2): qty 1

## 2021-09-21 MED ORDER — AMIODARONE HCL IN DEXTROSE 360-4.14 MG/200ML-% IV SOLN
60.0000 mg/h | INTRAVENOUS | Status: AC
  Administered 2021-09-21 (×2): 60 mg/h via INTRAVENOUS
  Filled 2021-09-21 (×2): qty 200

## 2021-09-21 MED ORDER — HEPARIN (PORCINE) 25000 UT/250ML-% IV SOLN
1000.0000 [IU]/h | INTRAVENOUS | Status: DC
  Administered 2021-09-21: 950 [IU]/h via INTRAVENOUS
  Filled 2021-09-21 (×2): qty 250

## 2021-09-21 MED ORDER — SORBITOL 70 % SOLN
30.0000 mL | Freq: Once | Status: AC
Start: 2021-09-21 — End: 2021-09-21
  Administered 2021-09-21: 30 mL via ORAL
  Filled 2021-09-21: qty 30

## 2021-09-21 MED ORDER — SODIUM CHLORIDE 0.9% FLUSH: 10.0000 mL | INTRAVENOUS | Status: DC | PRN

## 2021-09-21 MED ORDER — AMIODARONE HCL IN DEXTROSE 360-4.14 MG/200ML-% IV SOLN
30.0000 mg/h | INTRAVENOUS | Status: DC
  Administered 2021-09-22 – 2021-09-24 (×7): 30 mg/h via INTRAVENOUS
  Filled 2021-09-21 (×7): qty 200

## 2021-09-21 MED ORDER — AMIODARONE LOAD VIA INFUSION
150.0000 mg | Freq: Once | INTRAVENOUS | Status: AC
  Administered 2021-09-21: 150 mg via INTRAVENOUS
  Filled 2021-09-21: qty 83.34

## 2021-09-21 NOTE — Progress Notes (Signed)
ANTICOAGULATION CONSULT NOTE  Pharmacy Consult for IV heparin Indication: atrial fibrillation  Allergies  Allergen Reactions   Diphenhydramine Hcl     unknown   Nifedipine     unknown    Patient Measurements: Height: '5\' 8"'$  (172.7 cm) Weight: 68 kg (150 lb) IBW/kg (Calculated) : 68.4 Heparin Dosing Weight: 68 kg  Vital Signs: Temp: 97.6 F (36.4 C) (09/04 1920) Temp Source: Oral (09/04 1920) BP: 142/73 (09/04 2000) Pulse Rate: 102 (09/04 2000)  Labs: Recent Labs    09/19/21 0451 09/20/21 0541 09/21/21 0404 09/21/21 2015  HGB 10.5*  --  9.6*  --   HCT 31.2*  --  28.6*  --   PLT 284  --  239  --   HEPARINUNFRC  --   --   --  0.29*  CREATININE 1.80* 1.31* 1.25*  --      Estimated Creatinine Clearance: 44.6 mL/min (A) (by C-G formula based on SCr of 1.25 mg/dL (H)).   Medical History: Past Medical History:  Diagnosis Date   Cancer [facility])    prostate   Diabetes mellitus without complication ([place])    Hypertension     Medications:  Infusions:   sodium chloride Stopped (09/21/21 1539)   sodium chloride Stopped (09/21/21 1537)   amiodarone 30 mg/hr (09/21/21 2000)   DOBUTamine 3 mcg/kg/min (09/21/21 2000)   heparin 950 Units/hr (09/21/21 2000)   norepinephrine (LEVOPHED) Adult infusion Stopped (09/19/21 1257)   sodium chloride Stopped (09/17/21 2252)    Assessment: 81 yo male with new onset AFL overnight, pharmacy asked to begin anticoagulation with IV heparin.  Currently receiving SQ heparin for DVT prophylaxis - last dose 630 AM today.  No overt bleeding or complications noted.  Hgb low but fairly stable.  Heparin level came back slightly subtherapeutic at 950 units/hr. No s/sx of bleeding or infusion issues.   Goal of Therapy:  Heparin level 0.3-0.7 units/ml Monitor platelets by anticoagulation protocol: Yes   Plan:  Increase IV heparin gtt to 1000 units/hr Daily heparin level and CBC.  Antonietta Jewel, PharmD, [place] Clinical Pharmacist  Phone:  [phone] 09/21/2021 9:20 PM  Please check AMION for all [place] phone numbers After 10:00 PM, call [place] [phone]

## 2021-09-21 NOTE — Progress Notes (Addendum)
Advanced Heart Failure Rounding Note  PCP-Cardiologist: Sherren Mocha, MD   Subjective:    Tachycardic overnight, HR in the 140s. Appears to have been in Afib. Symptomatic w/ increased dyspnea. V-rates improved in the 80s this morning ? Afib vs SR w/ PACs. 12 lead pending.   Now on DBA 3. Off NE. Co-ox lower today, down from 68>>53%.   2.2L in UOP yesterday. CVP 10-11. Daily wts not charted.    Swan #s  CVP 10 PA 30/20 924) Thermo 4.13/2.28 PAPI 1.0 SVR 1453  Feels "worn out". No chest pain. Continues w/ intermittent dyspnea. Son present at bedside.   Objective:   Weight Range: 68 kg Body mass index is 22.81 kg/m.   Vital Signs:   Temp:  [97.3 F (36.3 C)-99.1 F (37.3 C)] 99.1 F (37.3 C) (09/04 0630) Pulse Rate:  [77-131] 89 (09/04 0630) Resp:  [15-27] 19 (09/04 0630) BP: (113-151)/(55-90) 143/72 (09/04 0600) SpO2:  [83 %-98 %] 97 % (09/04 0630) Arterial Line BP: (84-181)/(45-103) 129/58 (09/04 0630) Last BM Date : 09/20/21  Weight change: Filed Weights   09/17/21 1845  Weight: 68 kg    Intake/Output:   Intake/Output Summary (Last 24 hours) at 09/21/2021 0727 Last data filed at 09/21/2021 0600 Gross per 24 hour  Intake 1153.57 ml  Output 2150 ml  Net -996.43 ml      Physical Exam    CVP 10  General:  Well appearing. No respiratory difficulty HEENT: normal Neck: supple. JVD 10 cm, + Rt IJ Swan. Carotids 2+ bilat; no bruits. No lymphadenopathy or thyromegaly appreciated. Cor: PMI nondisplaced. Regular rate & rhythm. No rubs, gallops or murmurs. Lungs: clear Abdomen: soft, nontender, nondistended. No hepatosplenomegaly. No bruits or masses. Good bowel sounds. Extremities: no cyanosis, clubbing, rash, edema Neuro: alert & oriented x 3, cranial nerves grossly intact. moves all 4 extremities w/o difficulty. Affect pleasant.    Telemetry   Sinus 90s Personally reviewed   Labs    CBC Recent Labs    09/19/21 0451 09/21/21 0404  WBC 17.4*  7.6  HGB 10.5* 9.6*  HCT 31.2* 28.6*  MCV 92.0 92.3  PLT 284 315   Basic Metabolic Panel Recent Labs    09/18/21 2123 09/19/21 0451 09/20/21 0541 09/21/21 0404  NA  --    < > 134* 134*  K 4.0   < > 3.9 4.0  CL  --    < > 103 104  CO2  --    < > 24 22  GLUCOSE  --    < > 123* 150*  BUN  --    < > 33* 23  CREATININE  --    < > 1.31* 1.25*  CALCIUM  --    < > 8.3* 8.4*  MG 1.8  --   --   --    < > = values in this interval not displayed.   Liver Function Tests Recent Labs    09/20/21 0541 09/21/21 0404  AST 84* 57*  ALT 74* 69*  ALKPHOS 82 103  BILITOT 0.6 0.6  PROT 5.3* 5.5*  ALBUMIN 2.3* 2.3*   No results for input(s): "LIPASE", "AMYLASE" in the last 72 hours. Cardiac Enzymes No results for input(s): "CKTOTAL", "CKMB", "CKMBINDEX", "TROPONINI" in the last 72 hours.  BNP: BNP (last 3 results) No results for input(s): "BNP" in the last 8760 hours.  ProBNP (last 3 results) No results for input(s): "PROBNP" in the last 8760 hours.   D-Dimer No results  for input(s): "DDIMER" in the last 72 hours. Hemoglobin A1C No results for input(s): "HGBA1C" in the last 72 hours. Fasting Lipid Panel No results for input(s): "CHOL", "HDL", "LDLCALC", "TRIG", "CHOLHDL", "LDLDIRECT" in the last 72 hours. Thyroid Function Tests No results for input(s): "TSH", "T4TOTAL", "T3FREE", "THYROIDAB" in the last 72 hours.  Invalid input(s): "FREET3"  Other results:   Imaging    [place] EKG SITE RITE  Result Date: 09/20/2021 If Site Rite image not attached, placement could not be confirmed due to current cardiac rhythm.    Medications:     Scheduled Medications:  arformoterol  15 mcg Nebulization BID   aspirin  81 mg Oral Daily   atorvastatin  80 mg Oral Daily   Chlorhexidine Gluconate Cloth  6 each Topical Q0600   digoxin  0.0625 mg Oral Daily   docusate sodium  100 mg Oral Daily   dorzolamide  1 drop Both Eyes Daily   DULoxetine  30 mg Oral Daily   feeding supplement  (GLUCERNA SHAKE)  237 mL Oral BID BM   heparin  5,000 Units Subcutaneous Q8H   insulin aspart  0-15 Units Subcutaneous TID WC   insulin detemir  5 Units Subcutaneous BID   latanoprost  1 drop Both Eyes QHS   senna  1 tablet Oral BID   sodium chloride flush  3 mL Intravenous Q12H   ticagrelor  90 mg Oral BID    Infusions:  sodium chloride 10 mL/hr at 09/21/21 0600   sodium chloride 5 mL/hr at 09/21/21 0600   sodium chloride 10 mL/hr at 09/21/21 0600   DOBUTamine 3 mcg/kg/min (09/21/21 0600)   norepinephrine (LEVOPHED) Adult infusion Stopped (09/19/21 1257)   sodium chloride Stopped (09/17/21 2252)    PRN Medications: sodium chloride, acetaminophen, alum & mag hydroxide-simeth, HYDROcodone-acetaminophen, ondansetron (ZOFRAN) IV, mouth rinse, sodium chloride flush     Assessment/Plan   1. Cardiogenic shock due primarily to RV infarct in setting of late-presenting RCA STEMI - Outputs and BP improved on dobutamine 3. Off NE - Co-ox, lower today 53% PAP 1.0. Appears to be in afib. 12 lead EKG pending for confirmation  - repeat co-ox  - Echo EF 30-35% RV moderately reduced trivial MR - CVP 10 this morning. Keep CVP 8-12 - GDMT limited by shock/AKI/RV infarct. AKI improving, add spiro 12.5 mg daily  - Can pull swan once PICC placed - CR consult to see   2. 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 AKI  - due to shock +/- CIN component - Baseline SCr 0.96 -> 1.65 -> 1.84 -> 1.80 -> 1.3->1.25 - continue hemodynamic support    4. DM2, poorly controlled - HGBa1c 9.5% - cover SSI - DM2 consult - SGLT2i this admit but waiting until RV improves so as not to drop preload   5. Shock liver - improving follow LFTs - continue hemodynamic support  6. ? Afib -  check 12 lead EKG for confirmation  - if Afib, will need initiation of a/c and attempt at [place]    Length of Stay: [address], PA-C  09/21/2021, 7:27 AM  Advanced Heart Failure Team Pager [phone] (M-F; 7a - 5p)  Please contact [facility] for night-coverage after hours (5p -7a ) and weekends on [email or web address]  Agree with above.   Sitting up in bed. Remains on DBA 3. Developed AFL overnight.  Co-ox lower this am. PAPi remains ~1.0.   General:  Sitting up in bed No resp difficulty HEENT: normal Neck: supple. RIJ introducer (swan pulled this am) Carotids 2+ bilat; no bruits. No lymphadenopathy or thryomegaly appreciated. Cor: PMI nondisplaced. Irregular rate & rhythm. No rubs, gallops or murmurs. Lungs: clear Abdomen: soft, nontender, nondistended. No hepatosplenomegaly. No bruits or masses. Good bowel sounds. Extremities: no cyanosis, clubbing, rash, edema Neuro: alert & orientedx3, cranial nerves grossly intact. moves all 4 extremities w/o difficulty. Affect pleasant  Remains tenuous. Developed AFL overnight and co-ox down. Will start amio and IV heparin. Place PICC. Continue DBA. BP elevated. Ok to start spiro.   PT/OT to see.   CRITICAL CARE Performed by: Glori Bickers  Total critical care time: 35 minutes  Critical care time was exclusive of separately billable procedures and treating other patients.  Critical care was necessary to treat or prevent imminent or life-threatening deterioration.  Critical care was time spent personally by me (independent of midlevel providers or residents) on the following activities: development of treatment plan with patient and/or surrogate as well as nursing, discussions with consultants, evaluation of patient's response to treatment, examination of patient, obtaining history from patient or surrogate, ordering and performing treatments and interventions, ordering and review of laboratory studies, ordering and review of  radiographic studies, pulse oximetry and re-evaluation of patient's condition.  Glori Bickers, MD  10:33 AM

## 2021-09-21 NOTE — Progress Notes (Signed)
ANTICOAGULATION CONSULT NOTE - Initial Consult  Pharmacy Consult for IV heparin Indication: atrial fibrillation  Allergies  Allergen Reactions   Diphenhydramine Hcl     unknown   Nifedipine     unknown    Patient Measurements: Height: '5\' 8"'$  (172.7 cm) Weight: 68 kg (150 lb) IBW/kg (Calculated) : 68.4 Heparin Dosing Weight: 68 kg  Vital Signs: Temp: 99.7 F (37.6 C) (09/04 0930) Temp Source: Core (09/04 0800) BP: 130/71 (09/04 1017) Pulse Rate: 91 (09/04 1017)  Labs: Recent Labs    09/19/21 0451 09/20/21 0541 09/21/21 0404  HGB 10.5*  --  9.6*  HCT 31.2*  --  28.6*  PLT 284  --  239  CREATININE 1.80* 1.31* 1.25*    Estimated Creatinine Clearance: 44.6 mL/min (A) (by C-G formula based on SCr of 1.25 mg/dL (H)).   Medical History: Past Medical History:  Diagnosis Date   Cancer [facility])    prostate   Diabetes mellitus without complication ([place])    Hypertension     Medications:  Infusions:   sodium chloride 10 mL/hr at 09/21/21 1000   sodium chloride 10 mL/hr at 09/21/21 1000   amiodarone     Followed by   amiodarone     DOBUTamine 3 mcg/kg/min (09/21/21 1000)   heparin     norepinephrine (LEVOPHED) Adult infusion Stopped (09/19/21 1257)   sodium chloride Stopped (09/17/21 2252)    Assessment: 81 yo male with new onset AFL overnight, pharmacy asked to begin anticoagulation with IV heparin.  Currently receiving SQ heparin for DVT prophylaxis - last dose 630 AM today.  No overt bleeding or complications noted.  Hgb low but fairly stable.  Goal of Therapy:  Heparin level 0.3-0.7 units/ml Monitor platelets by anticoagulation protocol: Yes   Plan:  Start IV heparin gtt at 950 units/hr.  Will not give bolus given fairly recent SQ heparin dose this AM. Check heparin level in 8hrs Daily heparin level and CBC.  [place] Crane, Vena [place], BCPS, BCCP Clinical Pharmacist  09/21/2021 11:22 AM   [facility] pharmacy phone numbers are listed on [email or web address]

## 2021-09-21 NOTE — Progress Notes (Signed)
Peripherally Inserted Central Catheter Placement  The IV Nurse has discussed with the patient and/or persons authorized to consent for the patient, the purpose of this procedure and the potential benefits and risks involved with this procedure.  The benefits include less needle sticks, lab draws from the catheter, and the patient may be discharged home with the catheter. Risks include, but not limited to, infection, bleeding, blood clot (thrombus formation), and puncture of an artery; nerve damage and irregular heartbeat and possibility to perform a PICC exchange if needed/ordered by physician.  Alternatives to this procedure were also discussed.  Bard Power PICC patient education guide, fact sheet on infection prevention and patient information card has been provided to patient /or left at bedside.    PICC Placement Documentation  PICC Double Lumen 91/79/15 Right Basilic 39 cm 0 cm (Active)  Indication for Insertion or Continuance of Line Vasoactive infusions 09/21/21 1140  Exposed Catheter (cm) 0 cm 09/21/21 1140  Site Assessment Clean, Dry, Intact 09/21/21 1140  Lumen #1 Status Flushed;Saline locked;Blood return noted 09/21/21 1140  Lumen #2 Status Flushed;Saline locked;Blood return noted 09/21/21 1140  Dressing Type Transparent;Securing device 09/21/21 1140  Dressing Status Antimicrobial disc in place 09/21/21 1140  Safety Lock Not Applicable 05/69/79 4801  Line Care Connections checked and tightened 09/21/21 1140  Line Adjustment (NICU/IV Team Only) No 09/21/21 1140  Dressing Intervention New dressing 09/21/21 1140  Dressing Change Due 09/28/21 09/21/21 [place] 09/21/2021, 11:42 AM

## 2021-09-22 ENCOUNTER — Other Ambulatory Visit (HOSPITAL_COMMUNITY): Payer: Self-pay

## 2021-09-22 ENCOUNTER — Telehealth (HOSPITAL_COMMUNITY): Payer: Self-pay | Admitting: Pharmacy Technician

## 2021-09-22 DIAGNOSIS — I2111 ST elevation (STEMI) myocardial infarction involving right coronary artery: Secondary | ICD-10-CM | POA: Diagnosis not present

## 2021-09-22 LAB — CBC
HCT: 30.1 % — ABNORMAL LOW (ref 39.0–52.0)
Hemoglobin: 9.8 g/dL — ABNORMAL LOW (ref 13.0–17.0)
MCH: 30.5 pg (ref 26.0–34.0)
MCHC: 32.6 g/dL (ref 30.0–36.0)
MCV: 93.8 fL (ref 80.0–100.0)
Platelets: 263 10*3/uL (ref 150–400)
RBC: 3.21 MIL/uL — ABNORMAL LOW (ref 4.22–5.81)
RDW: 13.7 % (ref 11.5–15.5)
WBC: 8.2 10*3/uL (ref 4.0–10.5)
nRBC: 0 % (ref 0.0–0.2)

## 2021-09-22 LAB — COMPREHENSIVE METABOLIC PANEL
ALT: 70 U/L — ABNORMAL HIGH (ref 0–44)
AST: 47 U/L — ABNORMAL HIGH (ref 15–41)
Albumin: 2.3 g/dL — ABNORMAL LOW (ref 3.5–5.0)
Alkaline Phosphatase: 140 U/L — ABNORMAL HIGH (ref 38–126)
Anion gap: 8 (ref 5–15)
BUN: 18 mg/dL (ref 8–23)
CO2: 24 mmol/L (ref 22–32)
Calcium: 8.6 mg/dL — ABNORMAL LOW (ref 8.9–10.3)
Chloride: 104 mmol/L (ref 98–111)
Creatinine, Ser: 1.14 mg/dL (ref 0.61–1.24)
GFR, Estimated: >60 mL/min (ref >60)
Glucose, Bld: 176 mg/dL — ABNORMAL HIGH (ref 70–99)
Potassium: 3.6 mmol/L (ref 3.5–5.1)
Sodium: 136 mmol/L (ref 135–145)
Total Bilirubin: 0.4 mg/dL (ref 0.3–1.2)
Total Protein: 5.6 g/dL — ABNORMAL LOW (ref 6.5–8.1)

## 2021-09-22 LAB — GLUCOSE, CAPILLARY
Glucose-Capillary: 158 mg/dL — ABNORMAL HIGH (ref 70–99)
Glucose-Capillary: 228 mg/dL — ABNORMAL HIGH (ref 70–99)
Glucose-Capillary: 164 mg/dL — ABNORMAL HIGH (ref 70–99)
Glucose-Capillary: 146 mg/dL — ABNORMAL HIGH (ref 70–99)

## 2021-09-22 LAB — COOXEMETRY PANEL
Carboxyhemoglobin: 1.7 % — ABNORMAL HIGH (ref 0.5–1.5)
Methemoglobin: <0.7 % (ref 0.0–1.5)
O2 Saturation: 65.7 %
Total hemoglobin: 9.3 g/dL — ABNORMAL LOW (ref 12.0–16.0)

## 2021-09-22 LAB — HEPARIN LEVEL (UNFRACTIONATED): Heparin Unfractionated: 0.33 IU/mL (ref 0.30–0.70)

## 2021-09-22 LAB — DIGOXIN LEVEL: Digoxin Level: 0.3 ng/mL — ABNORMAL LOW (ref 0.8–2.0)

## 2021-09-22 MED ORDER — LOSARTAN POTASSIUM 25 MG PO TABS
12.5000 mg | ORAL_TABLET | Freq: Every day | ORAL | Status: DC
Start: 2021-09-22 — End: 2021-09-22

## 2021-09-22 MED ORDER — LOSARTAN POTASSIUM 25 MG PO TABS
25.0000 mg | ORAL_TABLET | Freq: Every day | ORAL | Status: DC
  Administered 2021-09-22 – 2021-09-23 (×2): 25 mg via ORAL
  Filled 2021-09-22 (×3): qty 1

## 2021-09-22 MED ORDER — APIXABAN 5 MG PO TABS
5.0000 mg | ORAL_TABLET | Freq: Two times a day (BID) | ORAL | Status: DC
  Administered 2021-09-22 – 2021-09-25 (×7): 5 mg via ORAL
  Filled 2021-09-22 (×7): qty 1

## 2021-09-22 MED ORDER — HEPARIN (PORCINE) 25000 UT/250ML-% IV SOLN
1000.0000 [IU]/h | INTRAVENOUS | Status: DC
Start: 2021-09-22 — End: 2021-09-22

## 2021-09-22 NOTE — Progress Notes (Signed)
ANTICOAGULATION CONSULT NOTE  Pharmacy Consult for IV heparin Indication: atrial fibrillation  Allergies  Allergen Reactions   Diphenhydramine Hcl     unknown   Nifedipine     unknown    Patient Measurements: Height: '5\' 8"'$  (172.7 cm) Weight: 68 kg (150 lb) IBW/kg (Calculated) : 68.4 Heparin Dosing Weight: 68 kg  Vital Signs: Temp: 97.3 F (36.3 C) (09/05 0348) Temp Source: Oral (09/05 0348) BP: 123/74 (09/05 0500) Pulse Rate: 94 (09/05 0500)  Labs: Recent Labs    09/20/21 0541 09/21/21 0404 09/21/21 2015 09/22/21 0500  HGB  --  9.6*  --  9.8*  HCT  --  28.6*  --  30.1*  PLT  --  239  --  263  HEPARINUNFRC  --   --  0.29* 0.33  CREATININE 1.31* 1.25*  --  1.14     Estimated Creatinine Clearance: 48.9 mL/min (by C-G formula based on SCr of 1.14 mg/dL).   Medical History: Past Medical History:  Diagnosis Date   Cancer [facility])    prostate   Diabetes mellitus without complication ([place])    Hypertension     Medications:  Infusions:   sodium chloride Stopped (09/21/21 1539)   sodium chloride Stopped (09/21/21 1537)   amiodarone 30 mg/hr (09/22/21 0016)   DOBUTamine 3 mcg/kg/min (09/21/21 2000)   heparin 1,000 Units/hr (09/21/21 2130)   norepinephrine (LEVOPHED) Adult infusion Stopped (09/19/21 1257)   sodium chloride Stopped (09/17/21 2252)    Assessment: 81 yo male with new onset AFL 9/4, pharmacy consulted to dose IV heparin. No overt bleeding or complications noted. Hgb low but stable. Pt is now in sinus rhythm.  Heparin level came back therapeutic at 0.33 on 1000 units/hr. No s/sx of bleeding or infusion issues.   Goal of Therapy:  Heparin level 0.3-0.7 units/ml Monitor platelets by anticoagulation protocol: Yes   Plan:  Continue IV heparin gtt 1000 units/hr Daily heparin level and CBC.  Thank you for allowing pharmacy to participate in this patient's care.  Reatha Harps, PharmD PGY2 Pharmacy Resident 09/22/2021 5:54 AM Check [email or web address] for unit  specific pharmacy number

## 2021-09-22 NOTE — TOC Benefit Eligibility Note (Signed)
Patient Teacher, English as a foreign language completed.    The patient is currently admitted and upon discharge could be taking Eliquis 5 mg.  The current 30 day co-pay is $45.00.   The patient is currently admitted and upon discharge could be taking Xarelto 20 mg.  The current 30 day co-pay is $45.00.   The patient is insured through [place], [place] Patient Advocate Specialist [place] Patient Advocate Team Direct Number: [phone]  Fax: [phone]

## 2021-09-22 NOTE — Evaluation (Addendum)
Physical Therapy Evaluation Patient Details Name: Tommy Graham MRN: [id number] DOB: May 08, 1940 Today's Date: 09/22/2021  History of Present Illness  81 yo admitted to [facility] 8/31 with chest pain since 8/30 with emergent transfer to [facility] for cath with PCI due to STEMI with RV infarct and cardiogenic shock. PMHx: DM, HTN, HLD, prostate CA  Clinical Impression  Pt pleasant, HOH as doesn't have batteries for hearing aids. Pt normally independent and enjoys woodworking with pt stating gradual fatigue for over a year. Pt with decreased gait, transfers and functional mobility who will benefit from acute therapy to maximize independence as well as OPPT for strengthening and cardiopulmonary training.   HR 104-128   95% on RA 86/68 before gait 138/79 after gait     Recommendations for follow up therapy are one component of a multi-disciplinary discharge planning process, led by the attending physician.  Recommendations may be updated based on patient status, additional functional criteria and insurance authorization.  Follow Up Recommendations Outpatient PT      Assistance Recommended at Discharge Intermittent Supervision/Assistance  Patient can return home with the following  A little help with walking and/or transfers;A little help with bathing/dressing/bathroom;Assistance with cooking/housework;Assist for transportation    Equipment Recommendations Rolling walker (2 wheels)  Recommendations for Other Services       Functional Status Assessment Patient has had a recent decline in their functional status and demonstrates the ability to make significant improvements in function in a reasonable and predictable amount of time.     Precautions / Restrictions Precautions Precautions: Other (comment) Precaution Comments: left radial cath 8/31 Restrictions      Mobility  Bed Mobility Overal bed mobility: Needs Assistance Bed Mobility: Supine to Sit     Supine to sit: HOB elevated,  Min assist     General bed mobility comments: HOB 20 degrees with cues for sequence and min assist to fully elevate trunk    Transfers Overall transfer level: Needs assistance   Transfers: Sit to/from Stand Sit to Stand: Min guard           General transfer comment: cues for hand placement to rise from bed and lower to chair    Ambulation/Gait Ambulation/Gait assistance: Min guard Gait Distance (Feet): 400 Feet Assistive device: Rolling walker (2 wheels) Gait Pattern/deviations: Step-through pattern, Decreased stride length   Gait velocity interpretation: >2.62 ft/sec, indicative of community ambulatory   General Gait Details: pt requested use of RW for gait stating feeling weak compared to baseline  Stairs            Wheelchair Mobility    Modified Rankin (Stroke Patients Only)       Balance Overall balance assessment: Mild deficits observed, not formally tested                                           Pertinent Vitals/Pain Pain Assessment Pain Assessment: No/denies pain    Home Living Family/patient expects to be discharged to:: Private residence Living Arrangements: Spouse/significant other Available Help at Discharge: Family;Available 24 hours/day Type of Home: House Home Access: Stairs to enter Entrance Stairs-Rails: None Entrance Stairs-Number of Steps: 2   Home Layout: Laundry or work area in [place]: BSC/3in1    Prior Function Prior Level of Function : Independent/Modified Independent             Mobility Comments: Pt independent PTA.  No A device ADLs Comments: independent and driving     Hand Dominance   Dominant Hand: Right    Extremity/Trunk Assessment   Upper Extremity Assessment Upper Extremity Assessment: Defer to OT evaluation  LUE Deficits / Details: limited weight bearing due to recent line removal otherwise WFL. Pt with h/o tremors but is off meds due to BP. Will resume soon. LUE  Sensation: WNL LUE Coordination: WNL    Lower Extremity Assessment Lower Extremity Assessment: Generalized weakness    Cervical / Trunk Assessment Cervical / Trunk Assessment: Normal  Communication   Communication: HOH  Cognition Arousal/Alertness: Awake/alert Behavior During Therapy: WFL for tasks assessed/performed Overall Cognitive Status: Within Functional Limits for tasks assessed                                             Exercises     Assessment/Plan    PT Assessment Patient needs continued PT services  PT Problem List Decreased strength;Decreased mobility;Decreased activity tolerance;Decreased knowledge of use of DME       PT Treatment Interventions Gait training;DME instruction;Therapeutic exercise;Functional mobility training;Stair training;Therapeutic activities;Patient/family education    PT Goals (Current goals can be found in the Care Plan section)  Acute Rehab PT Goals Patient Stated Goal: return to woodworking PT Goal Formulation: With patient/family Time For Goal Achievement: 10/06/21 Potential to Achieve Goals: Good    Frequency Min 3X/week     Co-evaluation               AM-PAC PT "6 Clicks" Mobility  Outcome Measure Help needed turning from your back to your side while in a flat bed without using bedrails?: A Little Help needed moving from lying on your back to sitting on the side of a flat bed without using bedrails?: A Little Help needed moving to and from a bed to a chair (including a wheelchair)?: A Little Help needed standing up from a chair using your arms (e.g., wheelchair or bedside chair)?: A Little Help needed to walk in hospital room?: A Little Help needed climbing 3-5 steps with a railing? : A Little 6 Click Score: 18    End of Session   Activity Tolerance: Patient tolerated treatment well Patient left: in chair;with call bell/phone within reach;with family/visitor present Nurse Communication: Mobility  status PT Visit Diagnosis: Other abnormalities of gait and mobility (R26.89);Difficulty in walking, not elsewhere classified (R26.2);Muscle weakness (generalized) (M62.81)    Time: 0240-9735 PT Time Calculation (min) (ACUTE ONLY): 20 min   Charges:   PT Evaluation $PT Eval Moderate Complexity: 1 Mod          Skyrah Krupp P, PT Acute Rehabilitation Services Office: [phone]   Lamarr Lulas 09/22/2021, 9:36 AM

## 2021-09-22 NOTE — Progress Notes (Addendum)
Advanced Heart Failure Rounding Note  PCP-Cardiologist: Sherren Mocha, MD   Subjective:    9/4 developed AFL w/ drop in Co-ox to 53%, started on amio gtt and heparin.   Converted back to NSR last PM. Currently on amio gtt at 30/hr. HR low 100s. SBPs 120s-130s   Now on DBA 3. Co-ox improved today, up to 66%  1.7L in UOP yesterday. CVP low, 4.   SCr improving, 1.31>>1.25>>1.14  K 3.6   Still feels tired. Dyspnea improved, O2 sats 97% on RA. No chest pain. Appietite is poor. Wife present at bedside.   Objective:   Weight Range: 69.5 kg Body mass index is 23.3 kg/m.   Vital Signs:   Temp:  [97.3 F (36.3 C)-99.7 F (37.6 C)] 97.3 F (36.3 C) (09/05 0348) Pulse Rate:  [84-133] 112 (09/05 0700) Resp:  [15-28] 19 (09/05 0700) BP: (103-151)/(61-94) 132/84 (09/05 0700) SpO2:  [74 %-100 %] 97 % (09/05 0700) Arterial Line BP: (92-149)/(64-92) 92/82 (09/04 1245) Weight:  [69.5 kg] 69.5 kg (09/05 0500) Last BM Date : 09/21/21  Weight change: Filed Weights   09/17/21 1845 09/22/21 0500  Weight: 68 kg 69.5 kg    Intake/Output:   Intake/Output Summary (Last 24 hours) at 09/22/2021 0755 Last data filed at 09/22/2021 0600 Gross per 24 hour  Intake 1494.56 ml  Output 1730 ml  Net -235.44 ml      Physical Exam    CVP 4  General:  Well appearing, sitting up in chair. No respiratory difficulty HEENT: normal Neck: supple. JVD not elevated Carotids 2+ bilat; no bruits. No lymphadenopathy or thyromegaly appreciated. Cor: PMI nondisplaced. Regular rate & rhythm. No rubs, gallops or murmurs. Lungs: decreased BS at the RLL, otherwise CTA  Abdomen: soft, nontender, nondistended. No hepatosplenomegaly. No bruits or masses. Good bowel sounds. Extremities: no cyanosis, clubbing, rash, edema + RUE PICC  Neuro: alert & oriented x 3, cranial nerves grossly intact. moves all 4 extremities w/o difficulty. Affect pleasant.  Telemetry   NSR- ST, upper 90s-low 100s Personally  reviewed   Labs    CBC Recent Labs    09/21/21 0404 09/22/21 0500  WBC 7.6 8.2  HGB 9.6* 9.8*  HCT 28.6* 30.1*  MCV 92.3 93.8  PLT 239 481   Basic Metabolic Panel Recent Labs    09/21/21 0404 09/22/21 0500  NA 134* 136  K 4.0 3.6  CL 104 104  CO2 22 24  GLUCOSE 150* 176*  BUN 23 18  CREATININE 1.25* 1.14  CALCIUM 8.4* 8.6*   Liver Function Tests Recent Labs    09/21/21 0404 09/22/21 0500  AST 57* 47*  ALT 69* 70*  ALKPHOS 103 140*  BILITOT 0.6 0.4  PROT 5.5* 5.6*  ALBUMIN 2.3* 2.3*   No results for input(s): "LIPASE", "AMYLASE" in the last 72 hours. Cardiac Enzymes No results for input(s): "CKTOTAL", "CKMB", "CKMBINDEX", "TROPONINI" in the last 72 hours.  BNP: BNP (last 3 results) No results for input(s): "BNP" in the last 8760 hours.  ProBNP (last 3 results) No results for input(s): "PROBNP" in the last 8760 hours.   D-Dimer No results for input(s): "DDIMER" in the last 72 hours. Hemoglobin A1C No results for input(s): "HGBA1C" in the last 72 hours. Fasting Lipid Panel No results for input(s): "CHOL", "HDL", "LDLCALC", "TRIG", "CHOLHDL", "LDLDIRECT" in the last 72 hours. Thyroid Function Tests No results for input(s): "TSH", "T4TOTAL", "T3FREE", "THYROIDAB" in the last 72 hours.  Invalid input(s): "FREET3"  Other results:  Imaging    No results found.   Medications:     Scheduled Medications:  arformoterol  15 mcg Nebulization BID   aspirin  81 mg Oral Daily   atorvastatin  80 mg Oral Daily   Chlorhexidine Gluconate Cloth  6 each Topical Q0600   digoxin  0.0625 mg Oral Daily   docusate sodium  100 mg Oral Daily   dorzolamide  1 drop Both Eyes Daily   DULoxetine  30 mg Oral Daily   feeding supplement (GLUCERNA SHAKE)  237 mL Oral BID BM   insulin aspart  0-15 Units Subcutaneous TID WC   insulin detemir  5 Units Subcutaneous BID   latanoprost  1 drop Both Eyes QHS   senna  1 tablet Oral BID   sodium chloride flush  10-40 mL  Intracatheter Q12H   sodium chloride flush  3 mL Intravenous Q12H   spironolactone  12.5 mg Oral Daily   ticagrelor  90 mg Oral BID    Infusions:  sodium chloride Stopped (09/21/21 1539)   sodium chloride Stopped (09/21/21 1537)   amiodarone 30 mg/hr (09/22/21 0600)   DOBUTamine 3 mcg/kg/min (09/22/21 0600)   heparin 1,000 Units/hr (09/22/21 0600)   norepinephrine (LEVOPHED) Adult infusion Stopped (09/19/21 1257)   sodium chloride Stopped (09/17/21 2252)    PRN Medications: sodium chloride, acetaminophen, alum & mag hydroxide-simeth, HYDROcodone-acetaminophen, ondansetron (ZOFRAN) IV, mouth rinse, sodium chloride flush, sodium chloride flush     Assessment/Plan   1. Cardiogenic shock due primarily to RV infarct in setting of late-presenting RCA STEMI - Outputs and BP improved on dobutamine 3. Off NE - Co-ox 66% - Echo EF 30-35% RV moderately reduced trivial MR - CVP 4 this morning. Will not push diuretics. No Entresto yet.   - Continue Spiro 12.5 mg daily  - Add Losartan 25 mg daily. Transition to eventual Entresto - Continue Digoxin 0.0625 mg     2. 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 AKI  - due to shock +/- CIN component - Baseline SCr 0.96 -> 1.65 -> 1.84 -> 1.80 -> 1.3->1.25->1.14  - continue hemodynamic support    4. DM2, poorly controlled - HGBa1c 9.5% - cover SSI - DM2 consult - SGLT2i this admit but waiting until RV improves so as not to drop preload   5. Shock liver - improving follow LFTs - continue hemodynamic support  6. PAF - back in NSR today  - continue amio gtt at 30/hr - continue heparin gtt. Eventual Eliquis once invasive procedures completed    Length of Stay: [address], PA-C  09/22/2021, 7:55  AM  Advanced Heart Failure Team Pager [phone] (M-F; 7a - 5p)  Please contact [facility] for night-coverage after hours (5p -7a ) and weekends on [email or web address]  Remains on DBA @ 3. CVP low. Co-ox 66%. Scr improving. Feels weak. Back in NSR on IV amio  General:  Sitting in chair  No resp difficulty HEENT: normal Neck: supple. no JVD. Carotids 2+ bilat; no bruits. No lymphadenopathy or thryomegaly appreciated. Cor: PMI nondisplaced. Regular rate & rhythm. No rubs, gallops or murmurs. Lungs: clear Abdomen: soft, nontender, nondistended. No hepatosplenomegaly. No bruits or masses. Good bowel sounds. Extremities: no cyanosis, clubbing, rash, edema Neuro: alert & orientedx3, cranial nerves grossly intact.  moves all 4 extremities w/o difficulty. Affect pleasant  Making slow progress. Wean DBA to 2. Continue IV amio today. Switch heparin to Eliqus. (Will defer LM/LAD PCI for now). Add losartan. Transfer to [facility].   Needs to ambulate   Glori Bickers, MD  9:31 AM

## 2021-09-22 NOTE — Telephone Encounter (Signed)
Pharmacy Patient Advocate Encounter  Insurance verification completed.    The patient is insured through [employer] Part D   The patient is currently admitted and ran test claims for the following: Eliquis, Xarelto.  Copays and coinsurance results were relayed to Inpatient clinical team.

## 2021-09-22 NOTE — H&P (Signed)
Patient and wife educated on IS, risk factors, exercise guidelines, angina, NTG, stent, Brilinta, Eliquis, ASA, MI, MI booklet given, cardiac catheterization site care, restrictions, nutrition, cardiac rehab phase 2 discussed with patient and wife, no further questions or concerns at this time.  [place] RN 9/5/20231:45 PM

## 2021-09-22 NOTE — Evaluation (Signed)
Occupational Therapy Evaluation Patient Details Name: Tommy Graham MRN: [id number] DOB: 05/25/1940 Today's Date: 09/22/2021   History of Present Illness 81 yo admitted to [facility] 8/31 with chest pain since 8/30 with emergent transfer to [facility] for cath with PCI due to STEMI with RV infarct and cardiogenic shock. PMHx: DM, HTN, HLD, prostate CA   Clinical Impression   Pt admitted with the above diagnosis and has the deficits listed below. Pt would benefit from one more session of OT to review energy conservation techniques and ensure independence with all adls so he can dc home with his wife.  Pt is not in need of further OT after d/c.       Recommendations for follow up therapy are one component of a multi-disciplinary discharge planning process, led by the attending physician.  Recommendations may be updated based on patient status, additional functional criteria and insurance authorization.   Follow Up Recommendations  No OT follow up    Assistance Recommended at Discharge PRN  Patient can return home with the following A little help with bathing/dressing/bathroom;Assistance with cooking/housework;Assist for transportation    Functional Status Assessment  Patient has had a recent decline in their functional status and demonstrates the ability to make significant improvements in function in a reasonable and predictable amount of time.  Equipment Recommendations  None recommended by OT    Recommendations for Other Services       Precautions / Restrictions Precautions Precautions: Other (comment) (no push/pull with LUE due to recent line removal) Restrictions Weight Bearing Restrictions: No RUE Weight Bearing: Partial weight bearing      Mobility Bed Mobility               General bed mobility comments: Pt in chair on arrival    Transfers Overall transfer level: Needs assistance Equipment used: Rolling walker (2 wheels) Transfers: Sit to/from Stand, Bed to  chair/wheelchair/BSC Sit to Stand: Min guard     Step pivot transfers: Min guard     General transfer comment: min guard mostly for multiple lines. Pt fatiguted but moving very well.      Balance Overall balance assessment: Needs assistance Sitting-balance support: Feet supported Sitting balance-Leahy Scale: Good     Standing balance support: During functional activity, Bilateral upper extremity supported Standing balance-Leahy Scale: Fair Standing balance comment: pt able to let go of walker to manage clothing when standing.  Took care to not weight bear through LUE during adls with walker.                           ADL either performed or assessed with clinical judgement   ADL Overall ADL's : Needs assistance/impaired Eating/Feeding: Sitting;Independent   Grooming: Wash/dry hands;Wash/dry face;Oral care;Applying deodorant;Brushing hair;Set up;Sitting Grooming Details (indicate cue type and reason): pt fatigued from walking.  Groomed at chair but should be fine to walk to bathroom once activity tolerance improves. Upper Body Bathing: Set up;Sitting   Lower Body Bathing: Minimal assistance;Sit to/from stand Lower Body Bathing Details (indicate cue type and reason): mild tremors noted when reaching to feet making donning socks mildly difficult requiring min assist. Pt usually on meds for tremors but has been off the meds for procedure. Upper Body Dressing : Set up;Sitting   Lower Body Dressing: Minimal assistance;Sit to/from stand   Toilet Transfer: Magazine features editor Details (indicate cue type and reason): only for lines Toileting- Clothing Manipulation and Hygiene: Supervision/safety;Sit to/from stand  Functional mobility during ADLs: Min guard General ADL Comments: Pt doing very well with adls. Pt most limited with activity tolerance and had just been up with PT walking around unit prior to OT arrival.     Vision Baseline Vision/History: 0 No  visual deficits Ability to See in Adequate Light: 0 Adequate Patient Visual Report: No change from baseline Vision Assessment?: No apparent visual deficits     Perception     Praxis      Pertinent Vitals/Pain Pain Assessment Pain Assessment: No/denies pain     Hand Dominance Right   Extremity/Trunk Assessment Upper Extremity Assessment Upper Extremity Assessment: LUE deficits/detail;RUE deficits/detail RUE Deficits / Details: tremors in RUE PTA. Off tremor meds due to BP for now but should resume shortly. RUE Sensation: WNL RUE Coordination: WNL LUE Deficits / Details: limited weight bearing due to recent line removal otherwise WFL. Pt with h/o tremors but is off meds due to BP. Will resume soon. LUE Sensation: WNL LUE Coordination: WNL   Lower Extremity Assessment Lower Extremity Assessment: Defer to PT evaluation   Cervical / Trunk Assessment Cervical / Trunk Assessment: Normal   Communication Communication Communication: HOH   Cognition Arousal/Alertness: Awake/alert Behavior During Therapy: WFL for tasks assessed/performed Overall Cognitive Status: Within Functional Limits for tasks assessed                                       General Comments  Pt doing very well with most adls. Pt most limited by endurance.    Exercises     Shoulder Instructions      Home Living Family/patient expects to be discharged to:: Private residence Living Arrangements: Spouse/significant other Available Help at Discharge: Family;Available 24 hours/day Type of Home: House Home Access: Stairs to enter [employer] of Steps: 2 Entrance Stairs-Rails: None Home Layout: Laundry or work area in basement     [employer] Shower/Tub: Occupational psychologist: Standard     Home Equipment: BSC/3in1   Additional Comments: instructed wife on use of BSC over commode or in shower as shower chair if pt fatigues at home.      Prior  Functioning/Environment Prior Level of Function : Independent/Modified Independent             Mobility Comments: Pt independent PTA. No A device ADLs Comments: independent and driving        OT Problem List: Decreased activity tolerance;Decreased knowledge of use of DME or AE      OT Treatment/Interventions: Self-care/ADL training;Energy conservation    OT Goals(Current goals can be found in the care plan section) Acute Rehab OT Goals Patient Stated Goal: to be able to work in yard again OT Goal Formulation: With patient/family Time For Goal Achievement: 10/06/21 Potential to Achieve Goals: Good ADL Goals Additional ADL Goal #1: Pt can dress self Ily Additional ADL Goal #2: Pt will walk to bathorom and complete all toileting tasks with mod I. Additional ADL Goal #3: Pt will state two things he can do at home to try to conserve energy during adls with no cues.  OT Frequency: Min 2X/week    Co-evaluation              AM-PAC OT "6 Clicks" Daily Activity     Outcome Measure Help from another person eating meals?: None Help from another person taking care of personal grooming?: None Help from another person toileting,  which includes using toliet, bedpan, or urinal?: A Little Help from another person bathing (including washing, rinsing, drying)?: A Little Help from another person to put on and taking off regular upper body clothing?: None Help from another person to put on and taking off regular lower body clothing?: A Little 6 Click Score: 21   End of Session Equipment Utilized During Treatment: Rolling walker (2 wheels) Nurse Communication: Mobility status  Activity Tolerance: Patient tolerated treatment well Patient left: in chair;with call bell/phone within reach;with family/visitor present  OT Visit Diagnosis: Unsteadiness on feet (R26.81)                Time: 4136-4383 OT Time Calculation (min): 29 min Charges:  OT General Charges $OT Visit: 1 Visit OT  Evaluation $OT Eval Low Complexity: 1 Low OT Treatments $Self Care/Home Management : 8-22 mins  Glenford Peers 09/22/2021, 9:32 AM

## 2021-09-23 DIAGNOSIS — I2111 ST elevation (STEMI) myocardial infarction involving right coronary artery: Secondary | ICD-10-CM | POA: Diagnosis not present

## 2021-09-23 LAB — COMPREHENSIVE METABOLIC PANEL
ALT: 55 U/L — ABNORMAL HIGH (ref 0–44)
AST: 32 U/L (ref 15–41)
Albumin: 2.2 g/dL — ABNORMAL LOW (ref 3.5–5.0)
Alkaline Phosphatase: 137 U/L — ABNORMAL HIGH (ref 38–126)
Anion gap: 8 (ref 5–15)
BUN: 17 mg/dL (ref 8–23)
CO2: 23 mmol/L (ref 22–32)
Calcium: 8.6 mg/dL — ABNORMAL LOW (ref 8.9–10.3)
Chloride: 105 mmol/L (ref 98–111)
Creatinine, Ser: 1.25 mg/dL — ABNORMAL HIGH (ref 0.61–1.24)
GFR, Estimated: 58 mL/min — ABNORMAL LOW (ref >60)
Glucose, Bld: 189 mg/dL — ABNORMAL HIGH (ref 70–99)
Potassium: 3.7 mmol/L (ref 3.5–5.1)
Sodium: 136 mmol/L (ref 135–145)
Total Bilirubin: 0.5 mg/dL (ref 0.3–1.2)
Total Protein: 5.7 g/dL — ABNORMAL LOW (ref 6.5–8.1)

## 2021-09-23 LAB — GLUCOSE, CAPILLARY
Glucose-Capillary: 181 mg/dL — ABNORMAL HIGH (ref 70–99)
Glucose-Capillary: 269 mg/dL — ABNORMAL HIGH (ref 70–99)
Glucose-Capillary: 145 mg/dL — ABNORMAL HIGH (ref 70–99)
Glucose-Capillary: 141 mg/dL — ABNORMAL HIGH (ref 70–99)

## 2021-09-23 LAB — COOXEMETRY PANEL
Carboxyhemoglobin: 1.7 % — ABNORMAL HIGH (ref 0.5–1.5)
Methemoglobin: <0.7 % (ref 0.0–1.5)
O2 Saturation: 68.7 %
Total hemoglobin: 8.8 g/dL — ABNORMAL LOW (ref 12.0–16.0)

## 2021-09-23 LAB — CBC
HCT: 30.8 % — ABNORMAL LOW (ref 39.0–52.0)
Hemoglobin: 10 g/dL — ABNORMAL LOW (ref 13.0–17.0)
MCH: 30.6 pg (ref 26.0–34.0)
MCHC: 32.5 g/dL (ref 30.0–36.0)
MCV: 94.2 fL (ref 80.0–100.0)
Platelets: 303 10*3/uL (ref 150–400)
RBC: 3.27 MIL/uL — ABNORMAL LOW (ref 4.22–5.81)
RDW: 13.7 % (ref 11.5–15.5)
WBC: 7.9 10*3/uL (ref 4.0–10.5)
nRBC: 0 % (ref 0.0–0.2)

## 2021-09-23 MED ORDER — POTASSIUM CHLORIDE CRYS ER 20 MEQ PO TBCR
20.0000 meq | EXTENDED_RELEASE_TABLET | Freq: Once | ORAL | Status: AC
Start: 2021-09-23 — End: 2021-09-23
  Administered 2021-09-23: 20 meq via ORAL
  Filled 2021-09-23: qty 1

## 2021-09-23 MED ORDER — CLOPIDOGREL BISULFATE 75 MG PO TABS
75.0000 mg | ORAL_TABLET | Freq: Every day | ORAL | Status: DC
  Administered 2021-09-24 – 2021-09-25 (×2): 75 mg via ORAL
  Filled 2021-09-23 (×2): qty 1

## 2021-09-23 MED ORDER — INSULIN ASPART 100 UNIT/ML IJ SOLN
3.0000 [IU] | Freq: Three times a day (TID) | INTRAMUSCULAR | Status: DC
Start: 2021-09-23 — End: 2021-09-24
  Administered 2021-09-23 – 2021-09-24 (×3): 3 [IU] via SUBCUTANEOUS

## 2021-09-23 MED ORDER — INSULIN DETEMIR 100 UNIT/ML ~~LOC~~ SOLN
8.0000 [IU] | Freq: Two times a day (BID) | SUBCUTANEOUS | Status: DC
  Administered 2021-09-23 – 2021-09-24 (×2): 8 [IU] via SUBCUTANEOUS
  Filled 2021-09-23 (×4): qty 0.08

## 2021-09-23 MED ORDER — CLOPIDOGREL BISULFATE 300 MG PO TABS
600.0000 mg | ORAL_TABLET | Freq: Once | ORAL | Status: AC
  Administered 2021-09-23: 600 mg via ORAL
  Filled 2021-09-23: qty 2

## 2021-09-23 MED ORDER — SPIRONOLACTONE 25 MG PO TABS
25.0000 mg | ORAL_TABLET | Freq: Every day | ORAL | Status: DC
  Administered 2021-09-23 – 2021-09-24 (×2): 25 mg via ORAL
  Filled 2021-09-23 (×2): qty 1

## 2021-09-23 MED ORDER — PREGABALIN 25 MG PO CAPS: 25.0000 mg | ORAL_CAPSULE | Freq: Two times a day (BID) | ORAL | Status: DC

## 2021-09-23 NOTE — Progress Notes (Signed)
Mobility Specialist Progress Note    09/23/21 1108  Mobility  Activity Ambulated with assistance in hallway  Level of Assistance Standby assist, set-up cues, supervision of patient - no hands on  Assistive Device Front wheel walker  Distance Ambulated (ft) 800 ft  Activity Response Tolerated well  $Mobility charge 1 Mobility   Pre-Mobility: 97 HR, 96% SpO2 During Mobility: 113 HR Post-Mobility: 103 HR  Pt received in chair and agreeable. No complaints on walk. Returned to chair with call bell in reach.    Hildred Alamin Mobility Specialist

## 2021-09-23 NOTE — Progress Notes (Signed)
Patient ambulated with tech x 2 today, tolerated well; anticoagulation, risk factors and exercise guideline education reinforced.  [phone] Albertine Grates RN 9/6/20231:56 PM

## 2021-09-23 NOTE — Progress Notes (Signed)
Occupational Therapy Treatment Patient Details Name: Tommy Graham MRN: [id number] DOB: 04-Jun-1940 Today's Date: 09/23/2021   History of present illness 81 yo admitted to [facility] 8/31 with chest pain since 8/30 with emergent transfer to [facility] for cath with PCI due to STEMI with RV infarct and cardiogenic shock. PMHx: DM, HTN, HLD, prostate CA   OT comments  Patient with good progress toward patient focused goals.  Worked a lot on energy conservation and pursed lip breathing.  Patient does continue to desaturate on RA, 87% to 89%.  With rest break and breathing he will rebound mid 90's on RA.  OT to continue efforts, with no post acute OT anticipated.     Recommendations for follow up therapy are one component of a multi-disciplinary discharge planning process, led by the attending physician.  Recommendations may be updated based on patient status, additional functional criteria and insurance authorization.    Follow Up Recommendations  No OT follow up    Assistance Recommended at Discharge PRN  Patient can return home with the following  A little help with bathing/dressing/bathroom;Assistance with cooking/housework;Assist for transportation   Equipment Recommendations  None recommended by OT    Recommendations for Other Services      Precautions / Restrictions Precautions Precautions: Fall Restrictions Weight Bearing Restrictions: No       Mobility Bed Mobility Overal bed mobility: Needs Assistance Bed Mobility: Supine to Sit     Supine to sit: Min assist     General bed mobility comments: discussed log roll    Transfers Overall transfer level: Needs assistance Equipment used: Rolling walker (2 wheels) Transfers: Sit to/from Stand, Bed to chair/wheelchair/BSC Sit to Stand: Supervision     Step pivot transfers: Supervision           Balance Overall balance assessment: Needs assistance Sitting-balance support: Feet supported Sitting balance-Leahy Scale:  Good     Standing balance support: Reliant on assistive device for balance Standing balance-Leahy Scale: Fair                             ADL either performed or assessed with clinical judgement   ADL                   Upper Body Dressing : Set up;Sitting   Lower Body Dressing: Sit to/from stand;Supervision/safety   Toilet Transfer: Supervision/safety;Rolling walker (2 wheels);Ambulation   Toileting- Clothing Manipulation and Hygiene: Modified independent;Sitting/lateral lean              Extremity/Trunk Assessment Upper Extremity Assessment Upper Extremity Assessment: Generalized weakness RUE Deficits / Details: tremors in RUE PTA. Off tremor meds due to BP for now but should resume shortly.            Vision       Perception     Praxis      Cognition Arousal/Alertness: Awake/alert Behavior During Therapy: WFL for tasks assessed/performed Overall Cognitive Status: Within Functional Limits for tasks assessed                                 General Comments: forgetfulness        Exercises      Shoulder Instructions       General Comments      Pertinent Vitals/ Pain       Pain Assessment Pain Assessment: No/denies pain  Home Living  Prior Functioning/Environment              Frequency  Min 2X/week        Progress Toward Goals  OT Goals(current goals can now be found in the care plan section)  Progress towards OT goals: Progressing toward goals  Acute Rehab OT Goals OT Goal Formulation: With patient/family Time For Goal Achievement: 10/06/21 Potential to Achieve Goals: Good  Plan Discharge plan remains appropriate    Co-evaluation                 AM-PAC OT "6 Clicks" Daily Activity     Outcome Measure   Help from another person eating meals?: None Help from another person taking care of personal grooming?: None Help from  another person toileting, which includes using toliet, bedpan, or urinal?: A Little Help from another person bathing (including washing, rinsing, drying)?: A Little Help from another person to put on and taking off regular upper body clothing?: None Help from another person to put on and taking off regular lower body clothing?: A Little 6 Click Score: 21    End of Session Equipment Utilized During Treatment: Rolling walker (2 wheels)  OT Visit Diagnosis: Unsteadiness on feet (R26.81)   Activity Tolerance Patient tolerated treatment well   Patient Left in chair;with call bell/phone within reach;with family/visitor present   Nurse Communication Mobility status        Time: 0825-0910 OT Time Calculation (min): 45 min  Charges: OT General Charges $OT Visit: 1 Visit OT Treatments $Self Care/Home Management : 8-22 mins $Therapeutic Activity: 23-37 mins  09/23/2021  RP, OTR/L  Acute Rehabilitation Services  Office:  [phone]   Metta Clines 09/23/2021, 9:19 AM

## 2021-09-23 NOTE — Progress Notes (Signed)
Inpatient Diabetes Program Recommendations  AACE/ADA: New Consensus Statement on Inpatient Glycemic Control (2015)  Target Ranges:  Prepandial:   less than 140 mg/dL      Peak postprandial:   less than 180 mg/dL (1-2 hours)      Critically ill patients:  140 - 180 mg/dL   Lab Results  Component Value Date   GLUCAP 269 (H) 09/23/2021   HGBA1C 9.5 (H) 09/17/2021    Review of Glycemic Control  Latest Reference Range & Units 09/22/21 06:33 09/22/21 11:30 09/22/21 16:01 09/22/21 21:21 09/23/21 06:34 09/23/21 11:17  Glucose-Capillary 70 - 99 mg/dL 158 (H) 228 (H) 164 (H) 146 (H) 181 (H) 269 (H)   Diabetes history: DM 2 Outpatient Diabetes medications:  Lantus 20 units q HS Metformin 850 mg bid Current orders for Inpatient glycemic control:  Novolog 0-15 units tid with meals Levemir 5 units bid  Inpatient Diabetes Program Recommendations:    Consider increasing Levemir to 8 units bid.  Also consider adding Novolog meal coverage 3 units tid with meals (hold if patient eats less than 50% or NPO).   Thanks,  Adah Perl, RN, BC-ADM Inpatient Diabetes Coordinator Pager [phone]  (8a-5p)

## 2021-09-23 NOTE — Progress Notes (Addendum)
Advanced Heart Failure Rounding Note  PCP-Cardiologist: Sherren Mocha, MD   Subjective:    9/4 developed AFL w/ drop in Co-ox to 53%, started on amio gtt and heparin.  9/5 DBA weaned to 2  CO-OX 69% on DBA 2. CVP 8.  Scr stable a 1.25. LFTs continue to trend down slowly.  In AF today. Rate controlled on amio gtt at 30/hr.  Requesting medication for tremor. Had been on propanolol prior to admission.  No dyspnea or CP. Getting ready to work with PT.  Objective:   Weight Range: 69.5 kg Body mass index is 23.3 kg/m.   Vital Signs:   Temp:  [97.4 F (36.3 C)-97.8 F (36.6 C)] 97.8 F (36.6 C) (09/06 0400) Pulse Rate:  [88-114] 88 (09/06 0744) Resp:  [18-24] 20 (09/06 0744) BP: (100-153)/(55-89) 125/59 (09/06 0744) SpO2:  [94 %-98 %] 96 % (09/06 0744) Last BM Date : 09/21/21  Weight change: Filed Weights   09/17/21 1845 09/22/21 0500  Weight: 68 kg 69.5 kg    Intake/Output:   Intake/Output Summary (Last 24 hours) at 09/23/2021 0811 Last data filed at 09/23/2021 0446 Gross per 24 hour  Intake 110.99 ml  Output 1600 ml  Net -1489.01 ml      Physical Exam    CVP 8 General:  Sitting up in bed.  HEENT: normal Neck: supple. JVP ~ 8 cm. Carotids 2+ bilat; no bruits.  Cor: PMI nondisplaced. Irregular rate & rhythm. No rubs, gallops or murmurs. Lungs: clear Abdomen: soft, nontender, nondistended.  Extremities: no cyanosis, clubbing, rash, + RUE PICC Neuro: alert & orientedx3, cranial nerves grossly intact. moves all 4 extremities w/o difficulty. Affect pleasant   Telemetry   Afib 80s    Labs    CBC Recent Labs    09/22/21 0500 09/23/21 0531  WBC 8.2 7.9  HGB 9.8* 10.0*  HCT 30.1* 30.8*  MCV 93.8 94.2  PLT 263 948   Basic Metabolic Panel Recent Labs    09/22/21 0500 09/23/21 0531  NA 136 136  K 3.6 3.7  CL 104 105  CO2 24 23  GLUCOSE 176* 189*  BUN 18 17  CREATININE 1.14 1.25*  CALCIUM 8.6* 8.6*   Liver Function Tests Recent Labs     09/22/21 0500 09/23/21 0531  AST 47* 32  ALT 70* 55*  ALKPHOS 140* 137*  BILITOT 0.4 0.5  PROT 5.6* 5.7*  ALBUMIN 2.3* 2.2*   No results for input(s): "LIPASE", "AMYLASE" in the last 72 hours. Cardiac Enzymes No results for input(s): "CKTOTAL", "CKMB", "CKMBINDEX", "TROPONINI" in the last 72 hours.  BNP: BNP (last 3 results) No results for input(s): "BNP" in the last 8760 hours.  ProBNP (last 3 results) No results for input(s): "PROBNP" in the last 8760 hours.   D-Dimer No results for input(s): "DDIMER" in the last 72 hours. Hemoglobin A1C No results for input(s): "HGBA1C" in the last 72 hours. Fasting Lipid Panel No results for input(s): "CHOL", "HDL", "LDLCALC", "TRIG", "CHOLHDL", "LDLDIRECT" in the last 72 hours. Thyroid Function Tests No results for input(s): "TSH", "T4TOTAL", "T3FREE", "THYROIDAB" in the last 72 hours.  Invalid input(s): "FREET3"  Other results:   Imaging    No results found.   Medications:     Scheduled Medications:  apixaban  5 mg Oral BID   arformoterol  15 mcg Nebulization BID   aspirin  81 mg Oral Daily   atorvastatin  80 mg Oral Daily   Chlorhexidine Gluconate Cloth  6 each Topical  [id number]   digoxin  0.0625 mg Oral Daily   docusate sodium  100 mg Oral Daily   dorzolamide  1 drop Both Eyes Daily   DULoxetine  30 mg Oral Daily   feeding supplement (GLUCERNA SHAKE)  237 mL Oral BID BM   insulin aspart  0-15 Units Subcutaneous TID WC   insulin detemir  5 Units Subcutaneous BID   latanoprost  1 drop Both Eyes QHS   losartan  25 mg Oral Daily   senna  1 tablet Oral BID   sodium chloride flush  10-40 mL Intracatheter Q12H   sodium chloride flush  3 mL Intravenous Q12H   spironolactone  12.5 mg Oral Daily   ticagrelor  90 mg Oral BID    Infusions:  sodium chloride Stopped (09/21/21 1539)   sodium chloride Stopped (09/21/21 1537)   amiodarone 30 mg/hr (09/23/21 0008)   DOBUTamine 2 mcg/kg/min (09/22/21 1200)   sodium  chloride Stopped (09/17/21 2252)    PRN Medications: sodium chloride, acetaminophen, alum & mag hydroxide-simeth, HYDROcodone-acetaminophen, ondansetron (ZOFRAN) IV, mouth rinse, sodium chloride flush, sodium chloride flush     Assessment/Plan   1. Cardiogenic shock due primarily to RV infarct in setting of late-presenting RCA STEMI - Outputs and BP improved on dobutamine 3. Off NE - Co-ox 69% on DBA 2. Wean DBA to 1.  - Echo EF 30-35% RV moderately reduced trivial MR - CVP 8 this morning. Will not push diuretics with RV failure. No Entresto yet.   - Ordered daily weights and strict Is/Os - Increase spiro to 25 daily - Continue Losartan 25 mg daily - Continue Digoxin 0.0625 mg  - No beta blocker with shock - Eventually add SGLT2i but waiting for RV to improve to avoid dropping preload   2. 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 Now off aspirin since anticoagulated. Continue high-intensity statin. - No beta blocker with shock - No current angina - probable PCI LM/LAD later this admission or several weeks down the road   3. AKI  - due to shock +/- CIN component - Baseline SCr 0.96 -> 1.65 -> 1.84 -> 1.80 -> 1.3-> 1.25-> 1.14-> 1.25 - continue hemodynamic support    4. DM2, poorly controlled - HGBa1c 9.5% - cover SSI - DM2 consult - SGLT2i this admit but waiting until RV improves so as not to drop preload   5. Shock liver - improving follow LFTs - continue hemodynamic support  6. PAF - back in AF today with controlled rate - continue amio gtt at 30/hr - plan for DCCV once off inotrope support - continue Eliquis 5 BID  7. Tremor - On propanolol PTA - Off beta blocker d/t shock - Will discuss Rx options with PharmD   Continue PT/OT  ADDENDUM: Patient back in sinus rhythm  this afternoon. Cancel TEE/DCCV. Continue IV amio while on DBA.   Length of Stay: 6  FINCH, [place], PA-C  09/23/2021, 8:11 AM  Advanced Heart Failure Team Pager [phone] (M-F; 7a - 5p)  Please contact [facility] for night-coverage after hours (5p -7a ) and weekends on [email or web address]  Patient seen and examined with the above-signed Advanced Practice Provider and/or Housestaff. I personally reviewed laboratory data, imaging studies and relevant notes. I independently examined the patient and formulated the important aspects of the plan. I have edited the note  to reflect any of my changes or salient points. I have personally discussed the plan with the patient and/or family.  He remains on DBA. Co-ox looks good. BP ok. Had recurrent AF this am - now back in NSR on IV amio. Continue to titrate GDMT  D/w Dr. Burt Knack. Will delay PCI LM/LAD until several weeks/months down the road.   Glori Bickers, MD  2:45 PM

## 2021-09-23 NOTE — Discharge Instructions (Signed)

## 2021-09-23 NOTE — Care Management Important Message (Signed)
Important Message  Patient Details  Name: Tommy Graham MRN: [id number] Date of Birth: Jul 22, 1940   Medicare Important Message Given:  Yes     Orbie Pyo 09/23/2021, 2:17 PM

## 2021-09-23 NOTE — Plan of Care (Signed)
  Problem: Clinical Measurements: Goal: Respiratory complications will improve Outcome: Progressing Goal: Cardiovascular complication will be avoided Outcome: Progressing   Problem: Activity: Goal: Risk for activity intolerance will decrease Outcome: Progressing   Problem: Coping: Goal: Level of anxiety will decrease Outcome: Progressing   Problem: Elimination: Goal: Will not experience complications related to urinary retention Outcome: Progressing   Problem: Pain Managment: Goal: General experience of comfort will improve Outcome: Progressing   Problem: Skin Integrity: Goal: Risk for impaired skin integrity will decrease Outcome: Progressing

## 2021-09-24 ENCOUNTER — Encounter (HOSPITAL_COMMUNITY): Payer: Self-pay | Admitting: Anesthesiology

## 2021-09-24 ENCOUNTER — Encounter (HOSPITAL_COMMUNITY)
Admission: EM | Disposition: A | Discharge status: Home / Self Care | Payer: Self-pay | Source: Home / Self Care | Attending: Cardiovascular Disease

## 2021-09-24 DIAGNOSIS — I2111 ST elevation (STEMI) myocardial infarction involving right coronary artery: Secondary | ICD-10-CM | POA: Diagnosis not present

## 2021-09-24 LAB — CBC
HCT: 31.4 % — ABNORMAL LOW (ref 39.0–52.0)
Hemoglobin: 10.3 g/dL — ABNORMAL LOW (ref 13.0–17.0)
MCH: 30.8 pg (ref 26.0–34.0)
MCHC: 32.8 g/dL (ref 30.0–36.0)
MCV: 94 fL (ref 80.0–100.0)
Platelets: 361 10*3/uL (ref 150–400)
RBC: 3.34 MIL/uL — ABNORMAL LOW (ref 4.22–5.81)
RDW: 13.7 % (ref 11.5–15.5)
WBC: 9.5 10*3/uL (ref 4.0–10.5)
nRBC: 0 % (ref 0.0–0.2)

## 2021-09-24 LAB — COMPREHENSIVE METABOLIC PANEL
ALT: 48 U/L — ABNORMAL HIGH (ref 0–44)
AST: 30 U/L (ref 15–41)
Albumin: 2.3 g/dL — ABNORMAL LOW (ref 3.5–5.0)
Alkaline Phosphatase: 141 U/L — ABNORMAL HIGH (ref 38–126)
Anion gap: 9 (ref 5–15)
BUN: 17 mg/dL (ref 8–23)
CO2: 24 mmol/L (ref 22–32)
Calcium: 8.9 mg/dL (ref 8.9–10.3)
Chloride: 104 mmol/L (ref 98–111)
Creatinine, Ser: 1.17 mg/dL (ref 0.61–1.24)
GFR, Estimated: >60 mL/min (ref >60)
Glucose, Bld: 114 mg/dL — ABNORMAL HIGH (ref 70–99)
Potassium: 3.8 mmol/L (ref 3.5–5.1)
Sodium: 137 mmol/L (ref 135–145)
Total Bilirubin: 0.3 mg/dL (ref 0.3–1.2)
Total Protein: 5.7 g/dL — ABNORMAL LOW (ref 6.5–8.1)

## 2021-09-24 LAB — GLUCOSE, CAPILLARY
Glucose-Capillary: 116 mg/dL — ABNORMAL HIGH (ref 70–99)
Glucose-Capillary: 166 mg/dL — ABNORMAL HIGH (ref 70–99)
Glucose-Capillary: 205 mg/dL — ABNORMAL HIGH (ref 70–99)
Glucose-Capillary: 158 mg/dL — ABNORMAL HIGH (ref 70–99)
Glucose-Capillary: 188 mg/dL — ABNORMAL HIGH (ref 70–99)

## 2021-09-24 LAB — COOXEMETRY PANEL
Carboxyhemoglobin: 1.6 % — ABNORMAL HIGH (ref 0.5–1.5)
Methemoglobin: <0.7 % (ref 0.0–1.5)
O2 Saturation: 69.8 %
Total hemoglobin: 11 g/dL — ABNORMAL LOW (ref 12.0–16.0)

## 2021-09-24 SURGERY — ECHOCARDIOGRAM, TRANSESOPHAGEAL
Anesthesia: Monitor Anesthesia Care

## 2021-09-24 MED ORDER — AMIODARONE LOAD VIA INFUSION
150.0000 mg | Freq: Once | INTRAVENOUS | Status: DC
  Filled 2021-09-24: qty 83.34

## 2021-09-24 MED ORDER — PROPRANOLOL HCL 10 MG PO TABS
10.0000 mg | ORAL_TABLET | Freq: Two times a day (BID) | ORAL | Status: DC
  Administered 2021-09-24 – 2021-09-25 (×3): 10 mg via ORAL
  Filled 2021-09-24 (×4): qty 1

## 2021-09-24 MED ORDER — AMIODARONE LOAD VIA INFUSION
150.0000 mg | Freq: Once | INTRAVENOUS | Status: AC
  Administered 2021-09-24: 150 mg via INTRAVENOUS
  Filled 2021-09-24: qty 83.34

## 2021-09-24 MED ORDER — LOSARTAN POTASSIUM 50 MG PO TABS
50.0000 mg | ORAL_TABLET | Freq: Every day | ORAL | Status: DC
  Administered 2021-09-24 – 2021-09-25 (×2): 50 mg via ORAL
  Filled 2021-09-24 (×2): qty 1

## 2021-09-24 MED ORDER — GLUCAGON HCL RDNA (DIAGNOSTIC) 1 MG IJ SOLR
INTRAMUSCULAR | Status: AC
  Filled 2021-09-24: qty 1

## 2021-09-24 MED ORDER — AMIODARONE IV BOLUS ONLY 150 MG/100ML: 150.0000 mg | Freq: Once | INTRAVENOUS | Status: DC

## 2021-09-24 MED ORDER — DEXTROSE 50 % IV SOLN
INTRAVENOUS | Status: AC
  Filled 2021-09-24: qty 50

## 2021-09-24 NOTE — Discharge Summary (Cosign Needed)
Advanced Heart Failure Team  Discharge Summary   Patient ID: Tommy Graham MRN: [id number], DOB/AGE: 05/02/1940 81 y.o. Admit date: 09/17/2021 D/C date:     09/25/2021   Primary Discharge Diagnoses:  Cardiogenic shock Inferolateral ST elevation MI CAD AKI Poorly Controlled DM II Shock liver  Paroxysmal atrial fibrillation Tremor  Hospital Course: Tommy Graham is an 81 y.o. male with hx uncontrolled DM II, HTN, HLD.   Admitted 09/17/21 with late presenting inferolateral STEMI with RV involvement c/b cardiogenic shock in setting of RV failure. Found to have proximal occlusion of RCA and 75% d LM/ostial LAD. Underwent emergent PCI/DES to RCA. Staged PCI LM/LAD to be planned at a later date. EF ~ 45%.   Hemodynamics c/w low-output RV failure. Initially on Milrinone for inotrope support, later switched to DBA/NE d/t persistent low-output. Developed AKI and shock liver. Renal function and LFTs were followed closely and improved with hemodynamic support. He was eventually weaned off inotropes and maintained stable CO-OX. GDMT titrated as tolerated.  Course c/b paroxysmal AF. Loaded with IV amio, in and out of a fib. Rate controlled, will continue amio load OP. Can consider DCCV as OP. PICC removed.   Pt will continue to be followed closely in the HF clinic, will need EKG, BMET, CBC. Dr Haroldine Laws evaluated and deemed appropriate for discharge. Medications sent to [facility].  Please see below for hospital course by problem. 1. Cardiogenic shock due primarily to RV infarct in setting of late-presenting RCA STEMI - Outputs and BP improved on dobutamine 3. Off NE - Co-ox 64%. Dobutamine stopped 9/6 - Echo EF 30-35% RV moderately reduced trivial MR - CVP 3 this morning. Will not push diuretics with RV failure. No Entresto yet.  - PICC removed - Hold spiro 25 today with CVP of 3, restart tomorrow at 12.5 - Continue Losartan 25 mg daily - Continue Digoxin 0.0625 mg  - Propranolol restarted  9/6 - Eventually add SGLT2i but waiting for RV to improve to avoid dropping preload - Daily weights and strict Is/Os 2. 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 Now off aspirin since anticoagulated. Continue high-intensity statin. - No current angina - For now will delay PCI LM/LAD until several weeks/months down the road d/w Dr. Burt Knack 3. AKI  - due to shock +/- CIN component - Baseline SCr 0.96 -> 1.65 -> 1.84 -> 1.80 -> 1.3-> 1.25-> 1.14-> 1.25->1.17 - continue hemodynamic support  4. DM2, poorly controlled - HGBa1c 9.5% - cover SSI - DM2 consult - SGLT2i this admit but waiting until RV improves so as not to drop preload 5. Shock liver, resolved -  LFTs back to normal - continue hemodynamic support 6. PAF - back in AF w/ controlled rate - Transitioned off amio gtt - continue amiodarone 235m BID for 7 days, then 200 mg daily.  - Can consider DCCV OP - continue Eliquis 5 BID 7. Tremor - On propanolol PTA - continue  Discharge Weight Range: 68.2kg Discharge Vitals: Blood pressure 116/81, pulse 84, temperature 97.6 F (36.4 C), temperature source Oral, resp. rate 20, height 5' 8" (1.727 m), weight 68.2 kg, SpO2 97 %.  Labs: Lab Results  Component Value Date   WBC 10.5 09/25/2021   HGB 10.7 (L) 09/25/2021   HCT 31.7 (L) 09/25/2021   MCV 92.7 09/25/2021   PLT 397 09/25/2021  Recent Labs  Lab 09/25/21 0500  NA 136  K 4.0  CL 103  CO2 23  BUN 18  CREATININE 1.15  CALCIUM 8.8*  PROT 5.9*  BILITOT 0.4  ALKPHOS 138*  ALT 41  AST 26  GLUCOSE 164*   Lab Results  Component Value Date   CHOL 148 09/17/2021   HDL 34 (L) 09/17/2021   LDLCALC 91 09/17/2021   TRIG 113 09/17/2021   BNP (last 3 results) No results for input(s): "BNP" in the last 8760 hours.  ProBNP (last 3  results) No results for input(s): "PROBNP" in the last 8760 hours.   Diagnostic Studies/Procedures  [facility] 8/31:  Acute inferolateral STEMI complicated by RV infarction and cardiogenic shock with thrombotic occlusion of the proximal RCA treated with PTCA, aspiration thrombectomy, and drug-eluting stent implantation  ECHO 9/1: LVEF 30-35%, LV demonstrates regional wall motion abnormalities. Mild LVH, G1DD, trivial MR  Discharge Medications   Allergies as of 09/25/2021       Reactions   Diphenhydramine Hcl    unknown   Nifedipine    unknown        Medication List     STOP taking these medications    amLODipine 10 MG tablet Commonly known as: NORVASC   escitalopram 10 MG tablet Commonly known as: LEXAPRO   HYDROcodone-acetaminophen 5-325 MG tablet Commonly known as: NORCO/VICODIN   lisinopril 20 MG tablet Commonly known as: ZESTRIL       TAKE these medications    amiodarone 200 MG tablet Commonly known as: PACERONE Take 200 mg twice a day for 7 days. After that take 200 mg once daily   apixaban 5 MG Tabs tablet Commonly known as: ELIQUIS Take 1 tablet (5 mg total) by mouth 2 (two) times daily.   atorvastatin 80 MG tablet Commonly known as: LIPITOR Take 1 tablet (80 mg total) by mouth daily. Start taking on: September 26, 2021   clopidogrel 75 MG tablet Commonly known as: PLAVIX Take 1 tablet (75 mg total) by mouth daily. Start taking on: September 26, 2021   digoxin 0.125 MG tablet Commonly known as: LANOXIN Take 0.5 tablets (0.0625 mg total) by mouth daily. Start taking on: September 26, 2021   dorzolamide 2 % ophthalmic solution Commonly known as: TRUSOPT Place 1 drop into both eyes in the morning.   DULoxetine 30 MG capsule Commonly known as: CYMBALTA Take 30 mg by mouth daily.   insulin glargine 100 UNIT/ML injection Commonly known as: LANTUS Inject 20 Units into the skin at bedtime.   latanoprost 0.005 % ophthalmic solution Commonly known  as: XALATAN Place 1 drop into both eyes at bedtime.   losartan 25 MG tablet Commonly known as: COZAAR Take 1 tablet (25 mg total) by mouth daily. Start taking on: September 26, 2021   metFORMIN 850 MG tablet Commonly known as: GLUCOPHAGE Take 850 mg by mouth in the morning and at bedtime.   propranolol 10 MG tablet Commonly known as: INDERAL Take 10 mg by mouth 2 (two) times daily.   spironolactone 25 MG tablet Commonly known as: ALDACTONE Take 0.5 tablets (12.5 mg total) by mouth daily. Start taking on: September 26, 2021               Durable Medical Equipment  (From admission, onward)           Start     Ordered   09/22/21 0956  For home use only DME Walker rolling  Once  Question Answer Comment  Walker: With [place]   Patient needs a walker to treat with the following condition Difficulty in walking, not elsewhere classified      09/22/21 0956            Disposition   The patient will be discharged in stable condition to home. Discharge Instructions     (HEART FAILURE PATIENTS) Call MD:  Anytime you have any of the following symptoms: 1) 3 pound weight gain in 24 hours or 5 pounds in 1 week 2) shortness of breath, with or without a dry hacking cough 3) swelling in the hands, feet or stomach 4) if you have to sleep on extra pillows at night in order to breathe.   Complete by: As directed    Amb Referral to Cardiac Rehabilitation   Complete by: As directed    Diagnosis:  STEMI Coronary Stents     After initial evaluation and assessments completed: Virtual Based Care may be provided alone or in conjunction with Phase 2 Cardiac Rehab based on patient barriers.: Yes   Intensive Cardiac Rehabilitation (ICR) [place] location only OR Traditional Cardiac Rehabilitation (TCR) If criteria for ICR are not met will enroll in TCR [facility] only): Yes   Diet - low sodium heart healthy   Complete by: As directed    Heart Failure patients record your daily weight  using the same scale at the same time of day   Complete by: As directed    Increase activity slowly   Complete by: As directed    STOP any activity that causes chest pain, shortness of breath, dizziness, sweating, or exessive weakness   Complete by: As directed        Follow-up Information     [facility] Follow up on 10/05/2021.   Specialty: Cardiology Why: Advanced Heart Failure Clinic 11 am Entrance C, Free Valet parking Please bring all medications with you Contact information: [address] [id number] [place] [phone]                  Duration of Discharge Encounter: Greater than 35 minutes   Signed, Earnie Larsson, AGACNP-BC  09/25/2021, 9:56 AM

## 2021-09-24 NOTE — Plan of Care (Signed)
  Problem: Clinical Measurements: Goal: Respiratory complications will improve Outcome: Progressing Goal: Cardiovascular complication will be avoided Outcome: Progressing   Problem: Activity: Goal: Risk for activity intolerance will decrease Outcome: Progressing   Problem: Coping: Goal: Level of anxiety will decrease Outcome: Progressing   Problem: Elimination: Goal: Will not experience complications related to urinary retention Outcome: Progressing   Problem: Pain Managment: Goal: General experience of comfort will improve Outcome: Progressing   

## 2021-09-24 NOTE — Progress Notes (Signed)
CARDIAC REHAB PHASE I   Offered to walk with pt. Pt c/o fatigue and weakness today. Resting in bed. Reviewed education with pts wife. Encouraged balance between ambulation and rest. Pt and wife deny further questions or concerns at this time. Will continue to follow.  [id number] Rufina Falco, RN BSN 09/24/2021 2:43 PM

## 2021-09-24 NOTE — Progress Notes (Addendum)
I was made aware patient became hypoglycemic this afternoon with blood glucose of 35. Given amp of D50 and 2 juices by RN>> now up to 205.  Currently has orders for SSI. In addition, on 09/06 Levemir increased to 8 u TID and added 3 u novolog TID with meals.   I discontinued levemir and meal coverage novolog for now. Will keep orders for SSI.

## 2021-09-24 NOTE — Progress Notes (Signed)
Pt was asymptomatic with HR 128-129. MD informed and was notified a order will be put in for Magnesium. No order received. Reached back out to MD for order, awaiting a response. Pt HR is now 88-90's with no sxs.

## 2021-09-24 NOTE — Progress Notes (Signed)
Physical Therapy Treatment Patient Details Name: Tommy Graham MRN: [id number] DOB: 1940/10/27 Today's Date: 09/24/2021   History of Present Illness 81 yo admitted to [facility] 8/31 with chest pain since 8/30 with emergent transfer to [facility] for cath with PCI due to STEMI with RV infarct and cardiogenic shock. PMHx: DM, HTN, HLD, prostate CA    PT Comments    Pt received OOB in recliner, pleasant and agreeable to session with good progress. Session focused on gait without DME for improved balance and postural reactions and increased activity tolerance. Pt demonstrating gait without AD with min guard throughout with no overt LOB, pt with mild deviations and able to self correct in all instances. Pt able to complete 10x STS from low recliner at end of session without UE use. Current plan remains appropriate to address deficits and maximize functional independence. Pt continues to benefit from skilled PT services to progress toward functional mobility goals.   HR 89-95   Recommendations for follow up therapy are one component of a multi-disciplinary discharge planning process, led by the attending physician.  Recommendations may be updated based on patient status, additional functional criteria and insurance authorization.  Follow Up Recommendations  Outpatient PT     Assistance Recommended at Discharge Intermittent Supervision/Assistance  Patient can return home with the following A little help with walking and/or transfers;A little help with bathing/dressing/bathroom;Assistance with cooking/housework;Assist for transportation   Equipment Recommendations  Rolling walker (2 wheels)    Recommendations for Other Services       Precautions / Restrictions Precautions Precautions: Fall Precaution Comments: left radial cath 8/31 Restrictions Weight Bearing Restrictions: No     Mobility  Bed Mobility Overal bed mobility: Needs Assistance             General bed mobility comments:  pt OOB in reclinr pre and post session    Transfers Overall transfer level: Needs assistance Equipment used: None Transfers: Sit to/from Stand, Bed to chair/wheelchair/BSC Sit to Stand: Supervision           General transfer comment: cues for hand placement, able to comeplet x10 at end of session    Ambulation/Gait Ambulation/Gait assistance: Min guard Gait Distance (Feet): 400 Feet Assistive device: None Gait Pattern/deviations: Step-through pattern, Decreased stride length       General Gait Details: gait without AD, no LOB, pt stating he feels weak   Stairs             Wheelchair Mobility    Modified Rankin (Stroke Patients Only)       Balance Overall balance assessment: Needs assistance Sitting-balance support: Feet supported Sitting balance-Leahy Scale: Good     Standing balance support: No upper extremity supported Standing balance-Leahy Scale: Fair Standing balance comment: able to ambulate without AD                            Cognition Arousal/Alertness: Awake/alert Behavior During Therapy: WFL for tasks assessed/performed Overall Cognitive Status: Within Functional Limits for tasks assessed                                 General Comments: forgetfulness        Exercises Other Exercises Other Exercises: STS x10 from recliner without AD    General Comments        Pertinent Vitals/Pain Pain Assessment Pain Assessment: No/denies pain    Home Living  Prior Function            PT Goals (current goals can now be found in the care plan section) Acute Rehab PT Goals PT Goal Formulation: With patient/family Time For Goal Achievement: 10/06/21    Frequency    Min 3X/week      PT Plan      Co-evaluation              AM-PAC PT "6 Clicks" Mobility   Outcome Measure  Help needed turning from your back to your side while in a flat bed without using  bedrails?: A Little Help needed moving from lying on your back to sitting on the side of a flat bed without using bedrails?: A Little Help needed moving to and from a bed to a chair (including a wheelchair)?: A Little Help needed standing up from a chair using your arms (e.g., wheelchair or bedside chair)?: A Little Help needed to walk in hospital room?: A Little Help needed climbing 3-5 steps with a railing? : A Little 6 Click Score: 18    End of Session Equipment Utilized During Treatment: Gait belt Activity Tolerance: Patient tolerated treatment well Patient left: in chair;with call bell/phone within reach;with family/visitor present Nurse Communication: Mobility status PT Visit Diagnosis: Other abnormalities of gait and mobility (R26.89);Difficulty in walking, not elsewhere classified (R26.2);Muscle weakness (generalized) (M62.81)     Time: 0712-1975 PT Time Calculation (min) (ACUTE ONLY): 18 min  Charges:  $Therapeutic Exercise: 8-22 mins                     Charlestine Rookstool R. PTA Acute Rehabilitation Services Office: [place] 09/24/2021, 11:07 AM

## 2021-09-24 NOTE — Progress Notes (Signed)
Patient called out saying he felt funny- upon entry in room RN noted patient to be pale and extremely clammy (gown and bed sheets saturated) Blood sugar check 37- 1 amp of D50 and 2 apple juices were provided to patient-recheck 205.  PA Marlyce Huge made aware-change to orders noted.

## 2021-09-24 NOTE — Progress Notes (Addendum)
Advanced Heart Failure Rounding Note  PCP-Cardiologist: Sherren Mocha, MD   Subjective:    9/4 developed AFL w/ drop in Co-ox to 53%, started on amio gtt and heparin.  9/5 DBA weaned to 2  CO-OX 70%. Dobutamine stopped yesterday. CVP 7.  Scr stable a 1.17. LFTs continue to trend down slowly.  In Afib/flutter today, briefly in NSR, back in fib around 3am.  Rate controlled on amio gtt at 30/hr.  Bisoprolol restarted this morning.   Around 2/3am he was awoken out of sleep, had gone back into a fib. Feels more tired this morning d/t lack of rest and being out of rhythm. No dyspnea or CP.   Objective:   Weight Range: 68.4 kg Body mass index is 22.93 kg/m.   Vital Signs:   Temp:  [97.6 F (36.4 C)-98.3 F (36.8 C)] 97.8 F (36.6 C) (09/07 0300) Pulse Rate:  [83-96] 95 (09/07 0300) Resp:  [16-20] 16 (09/07 0300) BP: (122-145)/(59-77) 122/77 (09/07 0300) SpO2:  [92 %-98 %] 95 % (09/07 0300) Weight:  [68.4 kg] 68.4 kg (09/07 0706) Last BM Date : 09/23/21  Weight change: Filed Weights   09/17/21 1845 09/22/21 0500 09/24/21 0706  Weight: 68 kg 69.5 kg 68.4 kg    Intake/Output:   Intake/Output Summary (Last 24 hours) at 09/24/2021 0728 Last data filed at 09/24/2021 0610 Gross per 24 hour  Intake 938.57 ml  Output 2300 ml  Net -1361.43 ml      Physical Exam    CVP 7 General:  well appearing. No respiratory difficulty HEENT: normal Neck: supple. JVD 7. Carotids 2+ bilat; no bruits. No lymphadenopathy or thyromegaly appreciated. Cor: PMI nondisplaced. Irregular rate & rhythm. No rubs, gallops or murmurs. Lungs: clear Abdomen: soft, nontender, nondistended. No hepatosplenomegaly. No bruits or masses. Good bowel sounds. Extremities: no cyanosis, clubbing, rash, edema. PICC RUE Neuro: alert & oriented x 3, cranial nerves grossly intact. moves all 4 extremities w/o difficulty. Affect pleasant.   Telemetry   Afib/flutter 90- low 100s, 0-3 PVC's/hr (Personally  reviewed)    Labs    CBC Recent Labs    09/23/21 0531 09/24/21 0508  WBC 7.9 9.5  HGB 10.0* 10.3*  HCT 30.8* 31.4*  MCV 94.2 94.0  PLT 303 509   Basic Metabolic Panel Recent Labs    09/23/21 0531 09/24/21 0508  NA 136 137  K 3.7 3.8  CL 105 104  CO2 23 24  GLUCOSE 189* 114*  BUN 17 17  CREATININE 1.25* 1.17  CALCIUM 8.6* 8.9   Liver Function Tests Recent Labs    09/23/21 0531 09/24/21 0508  AST 32 30  ALT 55* 48*  ALKPHOS 137* 141*  BILITOT 0.5 0.3  PROT 5.7* 5.7*  ALBUMIN 2.2* 2.3*   No results for input(s): "LIPASE", "AMYLASE" in the last 72 hours. Cardiac Enzymes No results for input(s): "CKTOTAL", "CKMB", "CKMBINDEX", "TROPONINI" in the last 72 hours.  BNP: BNP (last 3 results) No results for input(s): "BNP" in the last 8760 hours.  ProBNP (last 3 results) No results for input(s): "PROBNP" in the last 8760 hours.   D-Dimer No results for input(s): "DDIMER" in the last 72 hours. Hemoglobin A1C No results for input(s): "HGBA1C" in the last 72 hours. Fasting Lipid Panel No results for input(s): "CHOL", "HDL", "LDLCALC", "TRIG", "CHOLHDL", "LDLDIRECT" in the last 72 hours. Thyroid Function Tests No results for input(s): "TSH", "T4TOTAL", "T3FREE", "THYROIDAB" in the last 72 hours.  Invalid input(s): "FREET3"  Other results:  Imaging    No results found.   Medications:     Scheduled Medications:  apixaban  5 mg Oral BID   arformoterol  15 mcg Nebulization BID   atorvastatin  80 mg Oral Daily   Chlorhexidine Gluconate Cloth  6 each Topical Q0600   clopidogrel  75 mg Oral Daily   digoxin  0.0625 mg Oral Daily   docusate sodium  100 mg Oral Daily   dorzolamide  1 drop Both Eyes Daily   DULoxetine  30 mg Oral Daily   feeding supplement (GLUCERNA SHAKE)  237 mL Oral BID BM   insulin aspart  0-15 Units Subcutaneous TID WC   insulin aspart  3 Units Subcutaneous TID WC   insulin detemir  8 Units Subcutaneous BID   latanoprost  1 drop  Both Eyes QHS   losartan  25 mg Oral Daily   propranolol  10 mg Oral BID   senna  1 tablet Oral BID   sodium chloride flush  10-40 mL Intracatheter Q12H   sodium chloride flush  3 mL Intravenous Q12H   spironolactone  25 mg Oral Daily    Infusions:  sodium chloride Stopped (09/21/21 1539)   sodium chloride Stopped (09/21/21 1537)   amiodarone 30 mg/hr (09/24/21 0333)   sodium chloride Stopped (09/17/21 2252)    PRN Medications: sodium chloride, acetaminophen, alum & mag hydroxide-simeth, HYDROcodone-acetaminophen, ondansetron (ZOFRAN) IV, mouth rinse, sodium chloride flush, sodium chloride flush     Assessment/Plan   1. Cardiogenic shock due primarily to RV infarct in setting of late-presenting RCA STEMI - Outputs and BP improved on dobutamine 3. Off NE - Co-ox 70%. Dobutamine stopped yesterday - Echo EF 30-35% RV moderately reduced trivial MR - CVP 7 this morning. Will not push diuretics with RV failure. No Entresto yet.   - Ordered daily weights and strict Is/Os - Continue spiro 25 daily - Continue Losartan 25 mg daily - Continue Digoxin 0.0625 mg  - Bisoprolol restarted today - Eventually add SGLT2i but waiting for RV to improve to avoid dropping preload   2. 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 Now off aspirin since anticoagulated. Continue high-intensity statin. - No current angina - For now will delay PCI LM/LAD until several weeks/months down the road d/w Dr. Burt Knack   3. AKI  - due to shock +/- CIN component - Baseline SCr 0.96 -> 1.65 -> 1.84 -> 1.80 -> 1.3-> 1.25-> 1.14-> 1.25->1.17 - continue hemodynamic support    4. DM2, poorly controlled - HGBa1c 9.5% - cover SSI - DM2 consult - SGLT2i this admit but waiting until RV improves so as not to drop preload   5.  Shock liver - improving, follow LFTs - continue hemodynamic support  6. PAF - back in AF w/ controlled rate - continue amio gtt at 30/hr - DCCV cancelled yesterday d/t back in NSR. In a fib/flutter today, rate controlled.  - amio bolus 150 x1 - continue Eliquis 5 BID  7. Tremor - On propanolol PTA - restarted today  Continue PT/OT  Length of Stay: [address], AGACNP-BC  09/24/2021, 7:28 AM  Advanced Heart Failure Team Pager [phone] (M-F; 7a - 5p)  Please contact [facility] for night-coverage after hours (5p -7a ) and weekends on [email or web address]   Patient seen and examined with the above-signed Advanced Practice  Provider and/or Housestaff. I personally reviewed laboratory data, imaging studies and relevant notes. I independently examined the patient and formulated the important aspects of the plan. I have edited the note to reflect any of my changes or salient points. I have personally discussed the plan with the patient and/or family.  Dobutamine off yesterday. Co-ox stable at 70%. CVP ok.   Back in AF despite IV amio. On Eliquis/Plavix  Denies CP or SOB. Scr stable.   General:  Well appearing. No resp difficulty HEENT: normal Neck: supple. no JVD. Carotids 2+ bilat; no bruits. No lymphadenopathy or thryomegaly appreciated. Cor: PMI nondisplaced. Irregular rate & rhythm. No rubs, gallops or murmurs. Lungs: clear Abdomen: soft, nontender, nondistended. No hepatosplenomegaly. No bruits or masses. Good bowel sounds. Extremities: no cyanosis, clubbing, rash, edema Neuro: alert & orientedx3, cranial nerves grossly intact. moves all 4 extremities w/o difficulty. Affect pleasant  Co-ox stable off dobutamine. In/out AF. Will continue IV amio and Eliquis. Continue to titrate GDMT as tolerated -> will increase losartan to 50. Possible SGLT2i tomorrow. Propanolol restarted for tremors and tolerating.   Continue to ambulate.   Glori Bickers, MD  8:16 AM

## 2021-09-24 NOTE — Anesthesia Preprocedure Evaluation (Deleted)
Anesthesia Evaluation    Reviewed: Allergy & Precautions, Patient's Chart, lab work & pertinent test results  History of Anesthesia Complications Negative for: history of anesthetic complications  Airway        Dental   Pulmonary neg pulmonary ROS,           Cardiovascular hypertension, Pt. on medications + Past MI (STEMI 09/17/21)  + dysrhythmias Atrial Fibrillation Pacemaker: Hgb 10.3.   TTE 09/18/21: EF 30-35%, mild LVH, grade 1 DD, RV systolic function moderately reduced, moderate RVE, valves ok    Neuro/Psych negative neurological ROS  negative psych ROS   GI/Hepatic negative GI ROS, Neg liver ROS,   Endo/Other  diabetes, Type 2, Oral Hypoglycemic Agents, Insulin Dependent  Renal/GU negative Renal ROS  negative genitourinary   Musculoskeletal negative musculoskeletal ROS (+)   Abdominal   Peds  Hematology  (+) Blood dyscrasia, anemia ,   Anesthesia Other Findings Day of surgery medications reviewed with patient.  Reproductive/Obstetrics negative OB ROS                             Anesthesia Physical Anesthesia Plan  ASA: 4  Anesthesia Plan: General   Post-op Pain Management: Minimal or no pain anticipated   Induction: Intravenous  PONV Risk Score and Plan: 2 and Treatment may vary due to age or medical condition and Propofol infusion  Airway Management Planned: Mask  Additional Equipment: None  Intra-op Plan:   Post-operative Plan:   Informed Consent:   Plan Discussed with:   Anesthesia Plan Comments:         Anesthesia Quick Evaluation

## 2021-09-24 NOTE — Progress Notes (Signed)
Patient on propanolol 10 BID PTA for tremor. Tremor has returned off the medication this admit.  Will restart propanolol now that he is off inotrope support and co-ox is stable

## 2021-09-25 ENCOUNTER — Other Ambulatory Visit (HOSPITAL_COMMUNITY): Payer: Self-pay

## 2021-09-25 ENCOUNTER — Encounter (HOSPITAL_COMMUNITY)
Admission: EM | Disposition: A | Discharge status: Home / Self Care | Payer: Self-pay | Source: Home / Self Care | Attending: Cardiovascular Disease

## 2021-09-25 DIAGNOSIS — I2111 ST elevation (STEMI) myocardial infarction involving right coronary artery: Secondary | ICD-10-CM | POA: Diagnosis not present

## 2021-09-25 LAB — CBC
HCT: 31.7 % — ABNORMAL LOW (ref 39.0–52.0)
Hemoglobin: 10.7 g/dL — ABNORMAL LOW (ref 13.0–17.0)
MCH: 31.3 pg (ref 26.0–34.0)
MCHC: 33.8 g/dL (ref 30.0–36.0)
MCV: 92.7 fL (ref 80.0–100.0)
Platelets: 397 10*3/uL (ref 150–400)
RBC: 3.42 MIL/uL — ABNORMAL LOW (ref 4.22–5.81)
RDW: 13.4 % (ref 11.5–15.5)
WBC: 10.5 10*3/uL (ref 4.0–10.5)
nRBC: 0 % (ref 0.0–0.2)

## 2021-09-25 LAB — COMPREHENSIVE METABOLIC PANEL
ALT: 41 U/L (ref 0–44)
AST: 26 U/L (ref 15–41)
Albumin: 2.5 g/dL — ABNORMAL LOW (ref 3.5–5.0)
Alkaline Phosphatase: 138 U/L — ABNORMAL HIGH (ref 38–126)
Anion gap: 10 (ref 5–15)
BUN: 18 mg/dL (ref 8–23)
CO2: 23 mmol/L (ref 22–32)
Calcium: 8.8 mg/dL — ABNORMAL LOW (ref 8.9–10.3)
Chloride: 103 mmol/L (ref 98–111)
Creatinine, Ser: 1.15 mg/dL (ref 0.61–1.24)
GFR, Estimated: >60 mL/min (ref >60)
Glucose, Bld: 164 mg/dL — ABNORMAL HIGH (ref 70–99)
Potassium: 4 mmol/L (ref 3.5–5.1)
Sodium: 136 mmol/L (ref 135–145)
Total Bilirubin: 0.4 mg/dL (ref 0.3–1.2)
Total Protein: 5.9 g/dL — ABNORMAL LOW (ref 6.5–8.1)

## 2021-09-25 LAB — DIGOXIN LEVEL: Digoxin Level: 0.5 ng/mL — ABNORMAL LOW (ref 0.8–2.0)

## 2021-09-25 LAB — PROTIME-INR
INR: 1.5 — ABNORMAL HIGH (ref 0.8–1.2)
Prothrombin Time: 18.1 seconds — ABNORMAL HIGH (ref 11.4–15.2)

## 2021-09-25 LAB — GLUCOSE, CAPILLARY
Glucose-Capillary: 37 mg/dL — CL (ref 70–99)
Glucose-Capillary: 135 mg/dL — ABNORMAL HIGH (ref 70–99)
Glucose-Capillary: 142 mg/dL — ABNORMAL HIGH (ref 70–99)
Glucose-Capillary: 342 mg/dL — ABNORMAL HIGH (ref 70–99)

## 2021-09-25 LAB — COOXEMETRY PANEL
Carboxyhemoglobin: 1.3 % (ref 0.5–1.5)
Methemoglobin: <0.7 % (ref 0.0–1.5)
O2 Saturation: 64 %
Total hemoglobin: 11 g/dL — ABNORMAL LOW (ref 12.0–16.0)

## 2021-09-25 SURGERY — CANCELLED PROCEDURE

## 2021-09-25 MED ORDER — AMIODARONE HCL 200 MG PO TABS
ORAL_TABLET | ORAL | 5 refills | Status: DC
  Filled 2021-09-25: qty 37, 30d supply, fill #0

## 2021-09-25 MED ORDER — LOSARTAN POTASSIUM 25 MG PO TABS
25.0000 mg | ORAL_TABLET | Freq: Every day | ORAL | 5 refills | Status: DC
  Filled 2021-09-25: qty 30, 30d supply, fill #0

## 2021-09-25 MED ORDER — APIXABAN 5 MG PO TABS
5.0000 mg | ORAL_TABLET | Freq: Two times a day (BID) | ORAL | 5 refills | Status: DC
  Filled 2021-09-25: qty 60, 30d supply, fill #0

## 2021-09-25 MED ORDER — SODIUM CHLORIDE 0.9 % IV SOLN: INTRAVENOUS | Status: DC

## 2021-09-25 MED ORDER — CLOPIDOGREL BISULFATE 75 MG PO TABS
75.0000 mg | ORAL_TABLET | Freq: Every day | ORAL | 5 refills | Status: DC
  Filled 2021-09-25: qty 30, 30d supply, fill #0

## 2021-09-25 MED ORDER — AMIODARONE HCL 200 MG PO TABS
200.0000 mg | ORAL_TABLET | Freq: Two times a day (BID) | ORAL | Status: DC
  Administered 2021-09-25: 200 mg via ORAL
  Filled 2021-09-25: qty 1

## 2021-09-25 MED ORDER — SPIRONOLACTONE 12.5 MG HALF TABLET: 12.5000 mg | ORAL_TABLET | Freq: Every day | ORAL | Status: DC

## 2021-09-25 MED ORDER — SPIRONOLACTONE 25 MG PO TABS
12.5000 mg | ORAL_TABLET | Freq: Every day | ORAL | 5 refills | Status: DC
  Filled 2021-09-25: qty 30, 60d supply, fill #0

## 2021-09-25 MED ORDER — ATORVASTATIN CALCIUM 80 MG PO TABS
80.0000 mg | ORAL_TABLET | Freq: Every day | ORAL | 5 refills | Status: DC
  Filled 2021-09-25: qty 30, 30d supply, fill #0

## 2021-09-25 MED ORDER — DIGOXIN 125 MCG PO TABS
0.0625 mg | ORAL_TABLET | Freq: Every day | ORAL | 5 refills | Status: DC
  Filled 2021-09-25: qty 30, 60d supply, fill #0

## 2021-09-25 NOTE — Progress Notes (Signed)
PICC removed. Mild bleeding upon removal; pressure dressing applied. Patient instructed to remain in bed for 30 minutes.   Mylo Driskill Lorita Officer, RN

## 2021-09-25 NOTE — Progress Notes (Signed)
Occupational Therapy Treatment Patient Details Name: Tommy Graham MRN: [id number] DOB: 03-18-1940 Today's Date: 09/25/2021   History of present illness 81 yo admitted to [facility] 8/31 with chest pain since 8/30 with emergent transfer to [facility] for cath with PCI due to STEMI with RV infarct and cardiogenic shock. PMHx: DM, HTN, HLD, prostate CA   OT comments  Patient preparing for discharge this date.  Son is bringing in clothing, so patient competed grooming task with supervision, and able to walk in the halls with supervision.  Decreased activity tolerance is the primary deficit.  No post acute OT is needed, and OT will follow if he remains.     Recommendations for follow up therapy are one component of a multi-disciplinary discharge planning process, led by the attending physician.  Recommendations may be updated based on patient status, additional functional criteria and insurance authorization.    Follow Up Recommendations  No OT follow up    Assistance Recommended at Discharge PRN  Patient can return home with the following  Assist for transportation;Assistance with cooking/housework   Equipment Recommendations  None recommended by OT    Recommendations for Other Services      Precautions / Restrictions Precautions Precautions: Fall Restrictions Weight Bearing Restrictions: No       Mobility Bed Mobility Overal bed mobility: Modified Independent                  Transfers Overall transfer level: Needs assistance Equipment used: None Transfers: Sit to/from Stand Sit to Stand: Supervision                 Balance Overall balance assessment: Mild deficits observed, not formally tested                                         ADL either performed or assessed with clinical judgement   ADL                                         General ADL Comments: generalized supervision for ADL competion at sit to stand level     Extremity/Trunk Assessment              Vision Baseline Vision/History: 1 Wears glasses Patient Visual Report: No change from baseline     Perception Perception Perception: Not tested   Praxis Praxis Praxis: Not tested    Cognition Arousal/Alertness: Awake/alert Behavior During Therapy: WFL for tasks assessed/performed Overall Cognitive Status: Within Functional Limits for tasks assessed                                          Exercises      Shoulder Instructions       General Comments      Pertinent Vitals/ Pain       Pain Assessment Pain Assessment: No/denies pain  Home Living                                          Prior Functioning/Environment              Frequency  Min 2X/week        Progress Toward Goals  OT Goals(current goals can now be found in the care plan section)  Progress towards OT goals: Progressing toward goals  Acute Rehab OT Goals OT Goal Formulation: With patient Time For Goal Achievement: 10/06/21 Potential to Achieve Goals: Good  Plan Discharge plan remains appropriate    Co-evaluation                 AM-PAC OT "6 Clicks" Daily Activity     Outcome Measure   Help from another person eating meals?: None Help from another person taking care of personal grooming?: None Help from another person toileting, which includes using toliet, bedpan, or urinal?: A Little Help from another person bathing (including washing, rinsing, drying)?: A Little Help from another person to put on and taking off regular upper body clothing?: None Help from another person to put on and taking off regular lower body clothing?: A Little 6 Click Score: 21    End of Session    OT Visit Diagnosis: Unsteadiness on feet (R26.81)   Activity Tolerance Patient tolerated treatment well   Patient Left in chair;with call bell/phone within reach;with family/visitor present   Nurse Communication           Time: 6803-2122 OT Time Calculation (min): 15 min  Charges: OT General Charges $OT Visit: 1 Visit OT Treatments $Self Care/Home Management : 8-22 mins  09/25/2021  RP, OTR/L  Acute Rehabilitation Services  Office:  [phone]   Metta Clines 09/25/2021, 11:33 AM

## 2021-09-25 NOTE — Progress Notes (Signed)
Jennifer from Endo called floor this morning to ask about this patient and I reported patient has been in NSR since last night. She called back and stated his cardioversion has been cancelled. Breakfast was ordered for patient. EKG was done to confirm NSR which was placed in patient's chart. Patient remains on Amio gtt at '30mg'$ . Patient resting comfortably.

## 2021-09-25 NOTE — TOC Initial Note (Signed)
Transition of Care [facility]) - Initial/Assessment Note    Patient Details  Name: Tommy Graham MRN: [id number] Date of Birth: 10-18-1940  Transition [facility]) CM/SW Contact:    Marilu Favre, RN Phone Number: 09/25/2021, 11:18 AM  Clinical Narrative:                  Spoke to patient and wife at bedside.   Discussed OP PT and walker. Patient in agreement. Would like to go to [facility]. Confirmed face sheet information and entered order. AVS updated.   Called for walker . Gilford Rile will be delivered to hospital room. [employer] aware patient getting ready to discharge.  Expected Discharge Plan: Home/Self Care Barriers to Discharge: No Barriers Identified   Patient Goals and CMS Choice Patient states their goals for this hospitalization and ongoing recovery are:: to return to home CMS Medicare.gov Compare Post Acute Care list provided to:: Patient Choice offered to / list presented to : Patient  Expected Discharge Plan and Services Expected Discharge Plan: Home/Self Care   Discharge Planning Services: CM Consult Post Acute Care Choice:  (OP  PT) Living arrangements for the past 2 months: Single Family Home Expected Discharge Date: 09/25/21               DME Arranged: Gilford Rile rolling DME Agency: [employer] Date DME Agency Contacted: 09/25/21 Time DME Agency Contacted: [phone] Representative spoke with at DME Agency: [place]: NA          Prior Living Arrangements/Services Living arrangements for the past 2 months: [place] Lives with:: Spouse Patient language and need for interpreter reviewed:: Yes Do you feel safe going back to the place where you live?: Yes      Need for Family Participation in Patient Care: Yes (Comment) Care giver support system in place?: Yes (comment)   Criminal Activity/Legal Involvement Pertinent to Current Situation/Hospitalization: No - Comment as needed  Activities of Daily Living Home Assistive Devices/Equipment:  None ADL Screening (condition at time of admission) Patient's cognitive ability adequate to safely complete daily activities?: Yes Is the patient deaf or have difficulty hearing?: Yes Does the patient have difficulty seeing, even when wearing glasses/contacts?: No Does the patient have difficulty concentrating, remembering, or making decisions?: No Patient able to express need for assistance with ADLs?: Yes Does the patient have difficulty dressing or bathing?: No Independently performs ADLs?: Yes (appropriate for developmental age) Does the patient have difficulty walking or climbing stairs?: No Weakness of Legs: None Weakness of Arms/Hands: None  Permission Sought/Granted Permission sought to share information with : Case Manager, Family Supports, PCP Permission granted to share information with : Yes, Verbal Permission Granted  Share Information with NAME: Tommy Graham [phone]     Permission granted to share info w Relationship: son  Permission granted to share info w Contact Information: [phone]  Emotional Assessment Appearance:: Appears stated age Attitude/Demeanor/Rapport: Engaged Affect (typically observed): Accepting Orientation: : Oriented to Self, Oriented to Place, Oriented to  Time, Oriented to Situation Alcohol / Substance Use: Not Applicable Psych Involvement: No (comment)  Admission diagnosis:  ST elevation myocardial infarction (STEMI), unspecified artery ([place]) [I21.3] STEMI involving right coronary artery ([place]) [I21.11] Patient Active Problem List   Diagnosis Date Noted   STEMI involving right coronary artery ([place]) 09/17/2021   DIABETES MELLITUS, TYPE II 09/03/2006   HYPERTENSION 09/03/2006   PCP:  Cory Munch, PA-C Pharmacy:   [facility] [place]  [address] [place] [address] Phone: [phone] Fax: [place] [address] [place]  [address] Phone: [phone] Fax: [phone]     Social Determinants of Health (SDOH) Interventions    Readmission Risk Interventions     No data to display

## 2021-09-25 NOTE — Progress Notes (Signed)
All set for discharge, d/c instructions given to pt and wife. Take home meds and walker delivered , awaiting ride home.

## 2021-09-25 NOTE — Progress Notes (Signed)
Mobility Specialist Progress Note    09/25/21 1234  Mobility  Activity Ambulated with assistance in hallway  Level of Assistance Standby assist, set-up cues, supervision of patient - no hands on  Assistive Device Front wheel walker  Distance Ambulated (ft) 280 ft  Activity Response Tolerated well  $Mobility charge 1 Mobility   Pre-Mobility: 126/77 BP  Pt received in chair and agreeable. C/o feeling a little tired towards the end. Returned to chair with call bell in reach.    Hildred Alamin Mobility Specialist

## 2021-09-25 NOTE — Progress Notes (Signed)
Inpatient Diabetes Program Recommendations  AACE/ADA: New Consensus Statement on Inpatient Glycemic Control (2015)  Target Ranges:  Prepandial:   less than 140 mg/dL      Peak postprandial:   less than 180 mg/dL (1-2 hours)      Critically ill patients:  140 - 180 mg/dL   Lab Results  Component Value Date   GLUCAP 142 (H) 09/25/2021   HGBA1C 9.5 (H) 09/17/2021    Review of Glycemic Control  Latest Reference Range & Units 09/24/21 11:14 09/24/21 15:24 09/24/21 17:23 09/24/21 21:25  Glucose-Capillary 70 - 99 mg/dL 166 (H) 205 (H) 158 (H) 188 (H)  Diabetes history: DM 2 Outpatient Diabetes medications:  Lantus 20 units q HS Metformin 850 mg bid Current orders for Inpatient glycemic control:  Novolog 0-15 units tid with meals   Inpatient Diabetes Program Recommendations:     Note hypoglycemic episode yesterday.  Meal coverage and Levemir stopped.  Will follow. Will likely need low dose basal insulin restarted.  Will follow.   Thanks,  Adah Perl, RN, BC-ADM Inpatient Diabetes Coordinator Pager [phone]  (8a-5p)

## 2021-09-25 NOTE — Progress Notes (Signed)
CARDIAC REHAB PHASE I   Pt for d/c today. Stressed importance of daily weights. Encouraged protein and low salt. Questions and concerns addressed. Referred to CRP II .  [id number] Rufina Falco, RN BSN 09/25/2021 10:32 AM

## 2021-09-25 NOTE — Progress Notes (Addendum)
Advanced Heart Failure Rounding Note  PCP-Cardiologist: Sherren Mocha, MD   Subjective:    9/4 developed AFL w/ drop in Co-ox to 53%, started on amio gtt and heparin.  9/5 DBA weaned to 2  Dobutamine stopped 9/6. CO-OX 64%. CVP 3.  Scr stable a 1.15. LFTs back to normal.  Propranolol restarted yesterday for tremors.   In and out of a fib yesterday. Back in NSR as of ~1600 yesterday.  Rate controlled on amio gtt at 30/hr.  Symptomatic hypoglycemic event yesterday, changes to insulin, D50 & juice, resolved.   Feels better this morning, able to get decent rest. CVP 3, denies dizziness. Denies CP and SOB.   Objective:   Weight Range: 68.2 kg Body mass index is 22.86 kg/m.   Vital Signs:   Temp:  [97.6 F (36.4 C)-98.1 F (36.7 C)] 97.6 F (36.4 C) (09/08 0328) Pulse Rate:  [73-88] 76 (09/08 0328) Resp:  [15-25] 15 (09/08 0328) BP: (114-136)/(67-81) 125/81 (09/08 0328) SpO2:  [95 %-99 %] 98 % (09/08 0328) Weight:  [68.2 kg] 68.2 kg (09/08 0516) Last BM Date : 09/23/21  Weight change: Filed Weights   09/22/21 0500 09/24/21 0706 09/25/21 0516  Weight: 69.5 kg 68.4 kg 68.2 kg    Intake/Output:   Intake/Output Summary (Last 24 hours) at 09/25/2021 0724 Last data filed at 09/25/2021 0516 Gross per 24 hour  Intake 399.3 ml  Output 1650 ml  Net -1250.7 ml      Physical Exam  CVP 3 General:  well appearing. No respiratory difficulty HEENT: normal Neck: supple. No JVD. Carotids 2+ bilat; no bruits. No lymphadenopathy or thyromegaly appreciated. Cor: PMI nondisplaced. Regular rate & rhythm. No rubs, gallops or murmurs. Lungs: clear Abdomen: soft, nontender, nondistended. No hepatosplenomegaly. No bruits or masses. Good bowel sounds. Extremities: no cyanosis, clubbing, rash, edema. PICC RUE Neuro: alert & oriented x 3, cranial nerves grossly intact. moves all 4 extremities w/o difficulty. Affect pleasant.   Telemetry   NSR 80s (Personally reviewed)    Labs     CBC Recent Labs    09/24/21 0508 09/25/21 0500  WBC 9.5 10.5  HGB 10.3* 10.7*  HCT 31.4* 31.7*  MCV 94.0 92.7  PLT 361 573   Basic Metabolic Panel Recent Labs    09/24/21 0508 09/25/21 0500  NA 137 136  K 3.8 4.0  CL 104 103  CO2 24 23  GLUCOSE 114* 164*  BUN 17 18  CREATININE 1.17 1.15  CALCIUM 8.9 8.8*   Liver Function Tests Recent Labs    09/24/21 0508 09/25/21 0500  AST 30 26  ALT 48* 41  ALKPHOS 141* 138*  BILITOT 0.3 0.4  PROT 5.7* 5.9*  ALBUMIN 2.3* 2.5*   No results for input(s): "LIPASE", "AMYLASE" in the last 72 hours. Cardiac Enzymes No results for input(s): "CKTOTAL", "CKMB", "CKMBINDEX", "TROPONINI" in the last 72 hours.  BNP: BNP (last 3 results) No results for input(s): "BNP" in the last 8760 hours.  ProBNP (last 3 results) No results for input(s): "PROBNP" in the last 8760 hours.   D-Dimer No results for input(s): "DDIMER" in the last 72 hours. Hemoglobin A1C No results for input(s): "HGBA1C" in the last 72 hours. Fasting Lipid Panel No results for input(s): "CHOL", "HDL", "LDLCALC", "TRIG", "CHOLHDL", "LDLDIRECT" in the last 72 hours. Thyroid Function Tests No results for input(s): "TSH", "T4TOTAL", "T3FREE", "THYROIDAB" in the last 72 hours.  Invalid input(s): "FREET3"  Other results:   Imaging    No results  found.   Medications:     Scheduled Medications:  apixaban  5 mg Oral BID   arformoterol  15 mcg Nebulization BID   atorvastatin  80 mg Oral Daily   Chlorhexidine Gluconate Cloth  6 each Topical Q0600   clopidogrel  75 mg Oral Daily   digoxin  0.0625 mg Oral Daily   docusate sodium  100 mg Oral Daily   dorzolamide  1 drop Both Eyes Daily   DULoxetine  30 mg Oral Daily   feeding supplement (GLUCERNA SHAKE)  237 mL Oral BID BM   insulin aspart  0-15 Units Subcutaneous TID WC   latanoprost  1 drop Both Eyes QHS   losartan  50 mg Oral Daily   propranolol  10 mg Oral BID   senna  1 tablet Oral BID   sodium  chloride flush  10-40 mL Intracatheter Q12H   sodium chloride flush  3 mL Intravenous Q12H   spironolactone  25 mg Oral Daily    Infusions:  sodium chloride Stopped (09/21/21 1539)   sodium chloride Stopped (09/21/21 1537)   amiodarone 30 mg/hr (09/24/21 2240)   sodium chloride Stopped (09/17/21 2252)    PRN Medications: sodium chloride, acetaminophen, alum & mag hydroxide-simeth, HYDROcodone-acetaminophen, ondansetron (ZOFRAN) IV, mouth rinse, sodium chloride flush, sodium chloride flush     Assessment/Plan   1. Cardiogenic shock due primarily to RV infarct in setting of late-presenting RCA STEMI - Outputs and BP improved on dobutamine 3. Off NE - Co-ox 64%. Dobutamine stopped 9/6 - Echo EF 30-35% RV moderately reduced trivial MR - CVP 3 this morning. Will not push diuretics with RV failure. No Entresto yet.  - Hold spiro 25 today with CVP of 3 - Increase Losartan to 50 mg daily - Continue Digoxin 0.0625 mg  - Propranolol restarted today - Eventually add SGLT2i but waiting for RV to improve to avoid dropping preload - Daily weights and strict Is/Os   2. 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 Now off aspirin since anticoagulated. Continue high-intensity statin. - No current angina - For now will delay PCI LM/LAD until several weeks/months down the road d/w Dr. Burt Knack   3. AKI  - due to shock +/- CIN component - Baseline SCr 0.96 -> 1.65 -> 1.84 -> 1.80 -> 1.3-> 1.25-> 1.14-> 1.25->1.17 - continue hemodynamic support    4. DM2, poorly controlled - HGBa1c 9.5% - cover SSI - DM2 consult - SGLT2i this admit but waiting until RV improves so as not to drop preload   5. Shock liver, resolved -  LFTs back to normal - continue hemodynamic support  6. PAF - back in AF w/ controlled  rate - continue amio gtt at 30/hr - DVVC cancelled, back in NSR this am - continue Eliquis 5 BID  7. Tremor - On propanolol PTA - continue  Continue PT/OT  Length of Stay: [address], AGACNP-BC  09/25/2021, 7:24 AM  Advanced Heart Failure Team Pager [phone] (M-F; 7a - 5p)  Please contact [facility] for night-coverage after hours (5p -7a ) and weekends on [email or web address]  Patient seen and examined with the above-signed Advanced Practice Provider and/or Housestaff. I personally reviewed laboratory data, imaging studies and relevant notes. I independently examined the patient and formulated the important aspects of the plan. I have edited the note to reflect  any of my changes or salient points. I have personally discussed the plan with the patient and/or family.  Back in sinus rhythm on IV amio. Feels good. Had hypoglycemic event last night but resolved.  General:  Well appearing. No resp difficulty HEENT: normal Neck: supple. no JVD. Carotids 2+ bilat; no bruits. No lymphadenopathy or thryomegaly appreciated. Cor: PMI nondisplaced. Regular rate & rhythm. No rubs, gallops or murmurs. Lungs: clear Abdomen: soft, nontender, nondistended. No hepatosplenomegaly. No bruits or masses. Good bowel sounds. Extremities: no cyanosis, clubbing, rash, edema Neuro: alert & orientedx3, cranial nerves grossly intact. moves all 4 extremities w/o difficulty. Affect pleasant  Ok for d/c home today  D/c meds  Losartan 25 Spiro 12.5  Eliquis 5 bid Amio 200 bid Propanolol 10 tid Plavix 75 daily Digoxin 0.0625 Atorva 80   F/u in Clinic and CR.   Glori Bickers, MD  8:39 AM  Addendum:  Reverted back to controlled AF at 821a. AF in 70-80s. Asymptomatic. BP stable. Continue planned d/c.   Glori Bickers, MD  9:38 AM

## 2021-10-05 ENCOUNTER — Telehealth (HOSPITAL_COMMUNITY): Payer: Self-pay | Admitting: *Deleted

## 2021-10-05 ENCOUNTER — Inpatient Hospital Stay (HOSPITAL_COMMUNITY)
Admission: RE | Admit: 2021-10-05 | Discharge: 2021-10-05 | Disposition: A | Discharge status: Home / Self Care | Payer: PPO | Source: Ambulatory Visit | Attending: Internal Medicine | Admitting: Internal Medicine

## 2021-10-05 ENCOUNTER — Encounter (HOSPITAL_COMMUNITY): Payer: Self-pay

## 2021-10-05 ENCOUNTER — Ambulatory Visit (HOSPITAL_COMMUNITY)
Admit: 2021-10-05 | Discharge: 2021-10-05 | Disposition: A | Discharge status: Home / Self Care | Payer: PPO | Attending: Physician Assistant | Admitting: Physician Assistant

## 2021-10-05 ENCOUNTER — Other Ambulatory Visit (HOSPITAL_COMMUNITY): Payer: Self-pay | Admitting: Internal Medicine

## 2021-10-05 VITALS — BP 102/66 | HR 65 | Wt 139.4 lb

## 2021-10-05 DIAGNOSIS — N179 Acute kidney failure, unspecified: Secondary | ICD-10-CM | POA: Insufficient documentation

## 2021-10-05 DIAGNOSIS — Z794 Long term (current) use of insulin: Secondary | ICD-10-CM | POA: Diagnosis not present

## 2021-10-05 DIAGNOSIS — I480 Paroxysmal atrial fibrillation: Secondary | ICD-10-CM | POA: Diagnosis not present

## 2021-10-05 DIAGNOSIS — I4892 Unspecified atrial flutter: Secondary | ICD-10-CM

## 2021-10-05 DIAGNOSIS — K7200 Acute and subacute hepatic failure without coma: Secondary | ICD-10-CM | POA: Diagnosis not present

## 2021-10-05 DIAGNOSIS — I252 Old myocardial infarction: Secondary | ICD-10-CM | POA: Insufficient documentation

## 2021-10-05 DIAGNOSIS — Z8719 Personal history of other diseases of the digestive system: Secondary | ICD-10-CM | POA: Insufficient documentation

## 2021-10-05 DIAGNOSIS — I5022 Chronic systolic (congestive) heart failure: Secondary | ICD-10-CM | POA: Diagnosis not present

## 2021-10-05 DIAGNOSIS — I2510 Atherosclerotic heart disease of native coronary artery without angina pectoris: Secondary | ICD-10-CM

## 2021-10-05 DIAGNOSIS — Z7901 Long term (current) use of anticoagulants: Secondary | ICD-10-CM | POA: Insufficient documentation

## 2021-10-05 DIAGNOSIS — Z955 Presence of coronary angioplasty implant and graft: Secondary | ICD-10-CM | POA: Insufficient documentation

## 2021-10-05 DIAGNOSIS — Z7984 Long term (current) use of oral hypoglycemic drugs: Secondary | ICD-10-CM | POA: Insufficient documentation

## 2021-10-05 DIAGNOSIS — I483 Typical atrial flutter: Secondary | ICD-10-CM

## 2021-10-05 DIAGNOSIS — Z79899 Other long term (current) drug therapy: Secondary | ICD-10-CM | POA: Insufficient documentation

## 2021-10-05 DIAGNOSIS — E785 Hyperlipidemia, unspecified: Secondary | ICD-10-CM | POA: Diagnosis not present

## 2021-10-05 DIAGNOSIS — I110 Hypertensive heart disease with heart failure: Secondary | ICD-10-CM | POA: Insufficient documentation

## 2021-10-05 DIAGNOSIS — J449 Chronic obstructive pulmonary disease, unspecified: Secondary | ICD-10-CM | POA: Insufficient documentation

## 2021-10-05 DIAGNOSIS — Z7902 Long term (current) use of antithrombotics/antiplatelets: Secondary | ICD-10-CM | POA: Diagnosis not present

## 2021-10-05 DIAGNOSIS — E1165 Type 2 diabetes mellitus with hyperglycemia: Secondary | ICD-10-CM | POA: Insufficient documentation

## 2021-10-05 DIAGNOSIS — R0602 Shortness of breath: Secondary | ICD-10-CM | POA: Insufficient documentation

## 2021-10-05 DIAGNOSIS — R627 Adult failure to thrive: Secondary | ICD-10-CM | POA: Diagnosis not present

## 2021-10-05 LAB — COMPREHENSIVE METABOLIC PANEL
ALT: 17 U/L (ref 0–44)
AST: 16 U/L (ref 15–41)
Albumin: 3.5 g/dL (ref 3.5–5.0)
Alkaline Phosphatase: 104 U/L (ref 38–126)
Anion gap: 10 (ref 5–15)
BUN: 23 mg/dL (ref 8–23)
CO2: 25 mmol/L (ref 22–32)
Calcium: 9.8 mg/dL (ref 8.9–10.3)
Chloride: 99 mmol/L (ref 98–111)
Creatinine, Ser: 1.52 mg/dL — ABNORMAL HIGH (ref 0.61–1.24)
GFR, Estimated: 46 mL/min — ABNORMAL LOW (ref >60)
Glucose, Bld: 261 mg/dL — ABNORMAL HIGH (ref 70–99)
Potassium: 6.3 mmol/L (ref 3.5–5.1)
Sodium: 134 mmol/L — ABNORMAL LOW (ref 135–145)
Total Bilirubin: 0.7 mg/dL (ref 0.3–1.2)
Total Protein: 7.5 g/dL (ref 6.5–8.1)

## 2021-10-05 LAB — CBC
HCT: 39.1 % (ref 39.0–52.0)
Hemoglobin: 13.1 g/dL (ref 13.0–17.0)
MCH: 31 pg (ref 26.0–34.0)
MCHC: 33.5 g/dL (ref 30.0–36.0)
MCV: 92.4 fL (ref 80.0–100.0)
Platelets: 684 10*3/uL — ABNORMAL HIGH (ref 150–400)
RBC: 4.23 MIL/uL (ref 4.22–5.81)
RDW: 13.7 % (ref 11.5–15.5)
WBC: 9.6 10*3/uL (ref 4.0–10.5)
nRBC: 0 % (ref 0.0–0.2)

## 2021-10-05 LAB — BRAIN NATRIURETIC PEPTIDE: B Natriuretic Peptide: 719.7 pg/mL — ABNORMAL HIGH (ref 0.0–100.0)

## 2021-10-05 LAB — DIGOXIN LEVEL: Digoxin Level: 1.6 ng/mL (ref 0.8–2.0)

## 2021-10-05 MED ORDER — LOSARTAN POTASSIUM 25 MG PO TABS: 12.5000 mg | ORAL_TABLET | Freq: Every day | ORAL | 5 refills | Status: DC

## 2021-10-05 NOTE — Telephone Encounter (Signed)
[place], RN  10/05/2021  3:04 PM EDT Back to Top    Spoke w/pt and wife, they are aware, agreeable, and verbalize understanding, she will take him to [facility] in AM for labs, order placed

## 2021-10-05 NOTE — Progress Notes (Signed)
ReDS Vest / Clip - 10/05/21 1100       ReDS Vest / Clip   Station Marker C    Ruler Value 24.5    ReDS Value Range Low volume    ReDS Actual Value 33

## 2021-10-05 NOTE — Patient Instructions (Signed)
Medication Changes:  DECREASE Losartan to 12.5 mg (1/2 tab) Daily  Lab Work:  Labs done today, we will call you for abnormal results  Testing/Procedures:  Your provider has recommended that  you wear a Zio Patch for 3 days.  This monitor will record your heart rhythm for our review.  IF you have any symptoms while wearing the monitor please press the button.  If you have any issues with the patch or you notice a red or orange light on it please call the company at [phone].  Once you remove the patch please mail it back to the company as soon as possible so we can get the results.  Referrals:  You were referred to GI, please call them to schedule an appointment  Special Instructions // Education:  Do the following things EVERYDAY: Weigh yourself in the morning before breakfast. Write it down and keep it in a log. Take your medicines as prescribed Eat low salt foods--Limit salt (sodium) to 2000 mg per day.  Stay as active as you can everyday Limit all fluids for the day to less than 2 liters   Follow-Up in: 2 weeks  At the [facility], you and your health needs are our priority. We have a designated team specialized in the treatment of Heart Failure. This Care Team includes your primary Heart Failure Specialized Cardiologist (physician), Advanced Practice Providers (APPs- Physician Assistants and Nurse Practitioners), and Pharmacist who all work together to provide you with the care you need, when you need it.   You may see any of the following providers on your designated Care Team at your next follow up:  Dr. Glori Bickers Dr. Loralie Champagne Dr. Roxana Hires, NP Lyda Jester, [place] [facility] [place], [place] Forestine Na, NP Audry Riles, PharmD   Please be sure to bring in all your medications bottles to every appointment.   Need to Contact us:  If you have any questions or concerns before your next appointment please  send [place] a message through [place] or call our office at [phone].    TO LEAVE A MESSAGE FOR THE NURSE SELECT OPTION 2, PLEASE LEAVE A MESSAGE INCLUDING: YOUR NAME DATE OF BIRTH CALL BACK NUMBER REASON FOR CALL**this is important as we prioritize the call backs  YOU WILL RECEIVE A CALL BACK THE SAME DAY AS LONG AS YOU CALL BEFORE 4:00 PM

## 2021-10-05 NOTE — Telephone Encounter (Signed)
-----   Message from Joette Catching, [place] sent at 10/05/2021  1:52 PM EDT ----- Scr elevated and K 6.3. I'm not sure K is accurate. Stop losartan and spiro. Dig level elevated. Stop digoxin.   Recheck BMET as soon as able.

## 2021-10-05 NOTE — Progress Notes (Signed)
PCP: Primary Cardiologist:  HPI: 81 y.o. male with hx uncontrolled DM II, HTN, HLD. Admitted 09/17/21 with late presenting inferolateral STEMI with RV involvement c/b cardiogenic shock in setting of RV failure. Found to have proximal occlusion of RCA and 75% d LM/ostial LAD. Underwent emergent PCI/DES to RCA. Staged PCI LM/LAD to be planned at a later date. EF ~ 45%.    Hemodynamics c/w low-output RV failure. Initially on Milrinone for inotrope support, later switched to DBA/NE d/t persistent low-output. Developed AKI and shock liver. Renal function and LFTs were followed closely and improved with hemodynamic support. He was eventually weaned off inotropes and maintained stable CO-OX. GDMT titrated as tolerated.   Course c/b paroxysmal AF. Loaded with IV amio, in and out of a fib. Rate controlled.  Amio loading to be continued after discharge.   Here today for f/u with his daughter and wife. Has been feeling poorly since discharge. He has lost 15 lb. 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   He has poor appetite. In addition, he believes he had thrush from breathing treatments and has been using nystatin mouth rinse with improvement. Feels that food gets stuck when he swallows and wonders if he has an esophageal stricture.   Taking all medications as prescribed. No bleeding issues with eliquis and plavix.   ROS: All systems negative except as listed in HPI, PMH and Problem List.  SH:  Social History   Socioeconomic History   Marital status: Married    Spouse name: Not on file   Number of children: Not on file   Years of education: Not on file   Highest education level: Not on file  Occupational History   Not on file  Tobacco Use   Smoking status: Never   Smokeless tobacco: Never  Vaping Use   Vaping Use: Never used  Substance and Sexual Activity   Alcohol use: Never   Drug use: Never    Sexual activity: Not on file  Other Topics Concern   Not on file  Social History Narrative   Not on file   Social Determinants of Health   Financial Resource Strain: Not on file  Food Insecurity: No Food Insecurity (09/23/2021)   Hunger Vital Sign    Worried About Running Out of Food in the Last Year: Never true    Ran Out of Food in the Last Year: Never true  Transportation Needs: No Transportation Needs (09/23/2021)   PRAPARE - Hydrologist (Medical): No    Lack of Transportation (Non-Medical): No  Physical Activity: Not on file  Stress: Not on file  Social Connections: Not on file  Intimate Partner Violence: Not At Risk (09/23/2021)   Humiliation, Afraid, Rape, and Kick questionnaire    Fear of Current or Ex-Partner: No    Emotionally Abused: No    Physically Abused: No    Sexually Abused: No    FH: No family history on file.  Past Medical History:  Diagnosis Date   Cancer [facility])    prostate   Diabetes mellitus without complication (HCC)    Hypertension     Current Outpatient Medications  Medication Sig Dispense Refill   amiodarone (PACERONE) 200 MG tablet Take 200 mg by mouth daily.     apixaban (ELIQUIS) 5 MG TABS tablet Take 1 tablet (5 mg total) by mouth 2 (two) times daily. 60 tablet 5  atorvastatin (LIPITOR) 80 MG tablet Take 1 tablet (80 mg total) by mouth daily. 30 tablet 5   clopidogrel (PLAVIX) 75 MG tablet Take 1 tablet (75 mg total) by mouth daily. 30 tablet 5   digoxin (LANOXIN) 0.125 MG tablet Take 0.5 tablets (0.0625 mg total) by mouth daily. 30 tablet 5   dorzolamide (TRUSOPT) 2 % ophthalmic solution Place 1 drop into both eyes in the morning.     DULoxetine (CYMBALTA) 30 MG capsule Take 30 mg by mouth daily.     insulin glargine (LANTUS) 100 UNIT/ML injection Inject 8-20 Units into the skin at bedtime.     latanoprost (XALATAN) 0.005 % ophthalmic solution Place 1 drop into both eyes at bedtime.     metFORMIN (GLUCOPHAGE) 850  MG tablet Take 850 mg by mouth in the morning and at bedtime.     propranolol (INDERAL) 10 MG tablet Take 10 mg by mouth 2 (two) times daily.     spironolactone (ALDACTONE) 25 MG tablet Take 0.5 tablets (12.5 mg total) by mouth daily. 30 tablet 5   losartan (COZAAR) 25 MG tablet Take 0.5 tablets (12.5 mg total) by mouth daily. 30 tablet 5   No current facility-administered medications for this encounter.    Vitals:   10/05/21 1054  BP: 102/66  Pulse: 65  SpO2: 98%  Weight: 63.2 kg (139 lb 6.4 oz)    PHYSICAL EXAM:  General:  Thin, fatigued appearing HEENT: normal Neck: supple. JVP flat. Carotids 2+ bilaterally; no bruits.  Cor: PMI normal. Irregular rhythm, rate 70s. No rubs, gallops or murmurs. Lungs: clear Abdomen: soft, nontender, nondistended.  Extremities: no cyanosis, clubbing, rash, edema Neuro: alert & orientedx3, cranial nerves grossly intact. Moves all 4 extremities w/o difficulty. Affect pleasant.   ECG: Atrial flutter with variable AV block 71 bpm   ASSESSMENT & PLAN:  1. Chronic systolic CHF/iCM - Cardiogenic shock 08/23 due primarily to RV infarct in setting of late-presenting RCA STEMI. Required inotrope support with DBA. - Echo 08/23 EF 30-35% RV moderately reduced trivial MR - NYHA III. Volume looks good on exam and by ReDS 33%.  - Not requiring loop diuretic - Continue spiro at 12.5 - Decrease losartan to 12.5 mg daily - Continue Digoxin 0.0625 mg  - On propanolol for hx tremor - No SGLT2i for now with significant weight loss - Check CMET, CBC, BNP dig level today  - Encouraged him to start PT  2. 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 No aspirin since anticoagulated. Continue high-intensity statin. - No current angina - For now will delay PCI LM/LAD until several weeks/months down the road d/w Dr. Burt Knack  3. Recent AKI  - Scr  peaked at 1.80 during recent admit in setting of shock, down to 1.15 at discharge - Labs today  4. DM2, poorly controlled - HGBa1c 9.5% - On metformin and insulin - Planning to establish with Dr. Renne Crigler with Endocrine  5. Shock liver - During recent admit - Check LFTs today  6. PAF/AFL - in and out since recent admit, AFL with controlled rate today - continue amiodarone '200mg'$  daily - 3 day zio monitor. May refer to EP for consideration of ablation depending on AFL burden - continue Eliquis 5 BID  7. Failure to thrive - Has lost over 15 lb since discharge. Poor po intake and fatigue. - Encouraged protein shakes and increasing calories - Checking  digoxin level - Amiodarone could be contributing, recently cut back dose to 200 mg daily 3 days ago. Monitor. Check TSH today.  - He will f/u with GI for swallowing issues. Suspect esophageal stricture.   Follow-up: 2 weeks with [facility]

## 2021-10-06 ENCOUNTER — Other Ambulatory Visit (HOSPITAL_COMMUNITY)
Admission: RE | Admit: 2021-10-06 | Discharge: 2021-10-06 | Disposition: A | Discharge status: Home / Self Care | Payer: PPO | Source: Ambulatory Visit | Attending: Internal Medicine | Admitting: Internal Medicine

## 2021-10-06 ENCOUNTER — Telehealth (HOSPITAL_COMMUNITY): Payer: Self-pay | Admitting: *Deleted

## 2021-10-06 DIAGNOSIS — I5022 Chronic systolic (congestive) heart failure: Secondary | ICD-10-CM | POA: Diagnosis not present

## 2021-10-06 LAB — BASIC METABOLIC PANEL
Anion gap: 8 (ref 5–15)
BUN: 32 mg/dL — ABNORMAL HIGH (ref 8–23)
CO2: 24 mmol/L (ref 22–32)
Calcium: 9.4 mg/dL (ref 8.9–10.3)
Chloride: 100 mmol/L (ref 98–111)
Creatinine, Ser: 1.45 mg/dL — ABNORMAL HIGH (ref 0.61–1.24)
GFR, Estimated: 48 mL/min — ABNORMAL LOW (ref >60)
Glucose, Bld: 244 mg/dL — ABNORMAL HIGH (ref 70–99)
Potassium: 5.9 mmol/L — ABNORMAL HIGH (ref 3.5–5.1)
Sodium: 132 mmol/L — ABNORMAL LOW (ref 135–145)

## 2021-10-06 MED ORDER — LOKELMA 10 G PO PACK: 10.0000 g | PACK | Freq: Once | ORAL | 0 refills | Status: AC

## 2021-10-06 NOTE — Telephone Encounter (Signed)
-----   Message from Joette Catching, [place] sent at 10/06/2021  4:34 PM EDT ----- K remains elevated at 5.9. Stay off losartan and spiro. Make sure not using salt substitute with potassium. Give 10 g lokelma. Recheck K on 09/21.

## 2021-10-06 NOTE — Telephone Encounter (Signed)
Spoke w/pt and wife, they are aware and agreeable. Will remain off meds, will go to [facility] on Thur for repeat labs, he has been eating a lot of K rich foods, this was discussed, RX for [facility] sent in

## 2021-10-08 ENCOUNTER — Other Ambulatory Visit (HOSPITAL_COMMUNITY)
Admission: RE | Admit: 2021-10-08 | Discharge: 2021-10-08 | Disposition: A | Discharge status: Home / Self Care | Payer: PPO | Source: Ambulatory Visit | Attending: Physician Assistant | Admitting: Physician Assistant

## 2021-10-08 ENCOUNTER — Other Ambulatory Visit (HOSPITAL_COMMUNITY): Payer: Self-pay | Admitting: Cardiology

## 2021-10-08 ENCOUNTER — Telehealth (HOSPITAL_COMMUNITY): Payer: Self-pay | Admitting: Cardiology

## 2021-10-08 DIAGNOSIS — I5022 Chronic systolic (congestive) heart failure: Secondary | ICD-10-CM | POA: Diagnosis not present

## 2021-10-08 LAB — BASIC METABOLIC PANEL
Anion gap: 8 (ref 5–15)
BUN: 29 mg/dL — ABNORMAL HIGH (ref 8–23)
CO2: 25 mmol/L (ref 22–32)
Calcium: 9.2 mg/dL (ref 8.9–10.3)
Chloride: 100 mmol/L (ref 98–111)
Creatinine, Ser: 1.32 mg/dL — ABNORMAL HIGH (ref 0.61–1.24)
GFR, Estimated: 54 mL/min — ABNORMAL LOW (ref >60)
Glucose, Bld: 222 mg/dL — ABNORMAL HIGH (ref 70–99)
Potassium: 5.4 mmol/L — ABNORMAL HIGH (ref 3.5–5.1)
Sodium: 133 mmol/L — ABNORMAL LOW (ref 135–145)

## 2021-10-08 MED ORDER — LOKELMA 10 G PO PACK: PACK | ORAL | 0 refills | Status: DC

## 2021-10-08 NOTE — Telephone Encounter (Signed)
-----   Message from Joette Catching, [place] sent at 10/08/2021  1:01 PM EDT ----- K coming down. Continue to hold losartan and spiro. Give 10g lokelma again and recheck K on Monday.

## 2021-10-08 NOTE — Telephone Encounter (Signed)
Patient called.  Patient aware via wife Will have labs repeated at [facility] order placed

## 2021-10-12 ENCOUNTER — Encounter (INDEPENDENT_AMBULATORY_CARE_PROVIDER_SITE_OTHER): Payer: Self-pay | Admitting: *Deleted

## 2021-10-12 ENCOUNTER — Other Ambulatory Visit (HOSPITAL_COMMUNITY)
Admission: RE | Admit: 2021-10-12 | Discharge: 2021-10-12 | Disposition: A | Discharge status: Home / Self Care | Payer: PPO | Source: Ambulatory Visit | Attending: Physician Assistant | Admitting: Physician Assistant

## 2021-10-12 DIAGNOSIS — I483 Typical atrial flutter: Secondary | ICD-10-CM | POA: Diagnosis not present

## 2021-10-12 DIAGNOSIS — I5022 Chronic systolic (congestive) heart failure: Secondary | ICD-10-CM | POA: Insufficient documentation

## 2021-10-12 DIAGNOSIS — Z79899 Other long term (current) drug therapy: Secondary | ICD-10-CM | POA: Insufficient documentation

## 2021-10-12 LAB — BASIC METABOLIC PANEL
Anion gap: 6 (ref 5–15)
BUN: 25 mg/dL — ABNORMAL HIGH (ref 8–23)
CO2: 27 mmol/L (ref 22–32)
Calcium: 9 mg/dL (ref 8.9–10.3)
Chloride: 101 mmol/L (ref 98–111)
Creatinine, Ser: 1.31 mg/dL — ABNORMAL HIGH (ref 0.61–1.24)
GFR, Estimated: 55 mL/min — ABNORMAL LOW (ref >60)
Glucose, Bld: 269 mg/dL — ABNORMAL HIGH (ref 70–99)
Potassium: 5.4 mmol/L — ABNORMAL HIGH (ref 3.5–5.1)
Sodium: 134 mmol/L — ABNORMAL LOW (ref 135–145)

## 2021-10-13 ENCOUNTER — Encounter (HOSPITAL_COMMUNITY): Payer: PPO

## 2021-10-19 ENCOUNTER — Encounter (HOSPITAL_COMMUNITY): Payer: PPO

## 2021-10-20 ENCOUNTER — Encounter (HOSPITAL_COMMUNITY): Payer: Self-pay

## 2021-10-20 ENCOUNTER — Ambulatory Visit (HOSPITAL_COMMUNITY)
Admission: RE | Admit: 2021-10-20 | Discharge: 2021-10-20 | Disposition: A | Discharge status: Home / Self Care | Payer: PPO | Source: Ambulatory Visit | Attending: Adult Health | Admitting: Adult Health

## 2021-10-20 VITALS — BP 146/78 | HR 65 | Wt 143.8 lb

## 2021-10-20 DIAGNOSIS — Z7902 Long term (current) use of antithrombotics/antiplatelets: Secondary | ICD-10-CM | POA: Insufficient documentation

## 2021-10-20 DIAGNOSIS — I252 Old myocardial infarction: Secondary | ICD-10-CM | POA: Diagnosis not present

## 2021-10-20 DIAGNOSIS — I4892 Unspecified atrial flutter: Secondary | ICD-10-CM | POA: Insufficient documentation

## 2021-10-20 DIAGNOSIS — R627 Adult failure to thrive: Secondary | ICD-10-CM | POA: Diagnosis not present

## 2021-10-20 DIAGNOSIS — Z794 Long term (current) use of insulin: Secondary | ICD-10-CM | POA: Insufficient documentation

## 2021-10-20 DIAGNOSIS — I110 Hypertensive heart disease with heart failure: Secondary | ICD-10-CM | POA: Diagnosis not present

## 2021-10-20 DIAGNOSIS — Z7901 Long term (current) use of anticoagulants: Secondary | ICD-10-CM | POA: Diagnosis not present

## 2021-10-20 DIAGNOSIS — E1165 Type 2 diabetes mellitus with hyperglycemia: Secondary | ICD-10-CM | POA: Diagnosis not present

## 2021-10-20 DIAGNOSIS — E875 Hyperkalemia: Secondary | ICD-10-CM | POA: Diagnosis not present

## 2021-10-20 DIAGNOSIS — E785 Hyperlipidemia, unspecified: Secondary | ICD-10-CM | POA: Insufficient documentation

## 2021-10-20 DIAGNOSIS — I5022 Chronic systolic (congestive) heart failure: Secondary | ICD-10-CM | POA: Insufficient documentation

## 2021-10-20 DIAGNOSIS — I2510 Atherosclerotic heart disease of native coronary artery without angina pectoris: Secondary | ICD-10-CM | POA: Diagnosis not present

## 2021-10-20 DIAGNOSIS — Z955 Presence of coronary angioplasty implant and graft: Secondary | ICD-10-CM | POA: Diagnosis not present

## 2021-10-20 DIAGNOSIS — I2111 ST elevation (STEMI) myocardial infarction involving right coronary artery: Secondary | ICD-10-CM

## 2021-10-20 DIAGNOSIS — N179 Acute kidney failure, unspecified: Secondary | ICD-10-CM | POA: Diagnosis not present

## 2021-10-20 DIAGNOSIS — Z79899 Other long term (current) drug therapy: Secondary | ICD-10-CM | POA: Insufficient documentation

## 2021-10-20 DIAGNOSIS — K7200 Acute and subacute hepatic failure without coma: Secondary | ICD-10-CM | POA: Diagnosis not present

## 2021-10-20 DIAGNOSIS — Z7984 Long term (current) use of oral hypoglycemic drugs: Secondary | ICD-10-CM | POA: Insufficient documentation

## 2021-10-20 DIAGNOSIS — I480 Paroxysmal atrial fibrillation: Secondary | ICD-10-CM | POA: Insufficient documentation

## 2021-10-20 DIAGNOSIS — G4733 Obstructive sleep apnea (adult) (pediatric): Secondary | ICD-10-CM | POA: Insufficient documentation

## 2021-10-20 LAB — BASIC METABOLIC PANEL
Anion gap: 10 (ref 5–15)
BUN: 16 mg/dL (ref 8–23)
CO2: 25 mmol/L (ref 22–32)
Calcium: 9.2 mg/dL (ref 8.9–10.3)
Chloride: 102 mmol/L (ref 98–111)
Creatinine, Ser: 1.22 mg/dL (ref 0.61–1.24)
GFR, Estimated: 60 mL/min — ABNORMAL LOW (ref >60)
Glucose, Bld: 214 mg/dL — ABNORMAL HIGH (ref 70–99)
Potassium: 4.8 mmol/L (ref 3.5–5.1)
Sodium: 137 mmol/L (ref 135–145)

## 2021-10-20 MED ORDER — PANTOPRAZOLE SODIUM 40 MG PO TBEC: 40.0000 mg | DELAYED_RELEASE_TABLET | Freq: Every day | ORAL | 11 refills | Status: DC

## 2021-10-20 NOTE — Progress Notes (Signed)
PCP: Primary HF Cardiologist: Dr Haroldine Laws Sleep Cardiology: Dr Radford Pax   HPI: 81 y.o. male with hx uncontrolled DM II, HTN, HLD. Admitted 09/17/21 with late presenting inferolateral STEMI with RV involvement c/b cardiogenic shock in setting of RV failure. Found to have proximal occlusion of RCA and 75% d LM/ostial LAD. Underwent emergent PCI/DES to RCA. Staged PCI LM/LAD to be planned at a later date. EF ~ 45%.    Hemodynamics c/w low-output RV failure. Initially on Milrinone for inotrope support, later switched to DBA/NE d/t persistent low-output. Developed AKI and shock liver. Renal function and LFTs were followed closely and improved with hemodynamic support. He was eventually weaned off inotropes and maintained stable CO-OX. GDMT titrated as tolerated.Course c/b paroxysmal AF. Loaded with IV amio, in and out of a fib. Rate controlled.  Amio loading to be continued after discharge.  Zio 10/05/21 io 29 % A flutter.   He was seen in the HF clinic 10/05/21 and was feeling bad due to fatigue./ K 6.3 Creatinine 1.5. Dig , spiro, and losartan stopped. Given lokelma.    Today he returns HF follow up. Feels much better has been walking 150 feet 2-3 times a day. Having ongoing difficulty swallowing. Complaining of reflux.No chest pain.  SOB after walking up inclines  but not as bad. Denies PND/Orthopnea. Appetite ok. No fever or chills. Weight at home 136-140  pounds. Taking all medications.   ROS: All systems negative except as listed in HPI, PMH and Problem List.  SH:  Social History   Socioeconomic History   Marital status: Married    Spouse name: Not on file   Number of children: Not on file   Years of education: Not on file   Highest education level: Not on file  Occupational History   Not on file  Tobacco Use   Smoking status: Never   Smokeless tobacco: Never  Vaping Use   Vaping Use: Never used  Substance and Sexual Activity   Alcohol use: Never   Drug use: Never   Sexual  activity: Not on file  Other Topics Concern   Not on file  Social History Narrative   Not on file   Social Determinants of Health   Financial Resource Strain: Not on file  Food Insecurity: No Food Insecurity (09/23/2021)   Hunger Vital Sign    Worried About Running Out of Food in the Last Year: Never true    Ran Out of Food in the Last Year: Never true  Transportation Needs: No Transportation Needs (09/23/2021)   PRAPARE - Hydrologist (Medical): No    Lack of Transportation (Non-Medical): No  Physical Activity: Not on file  Stress: Not on file  Social Connections: Not on file  Intimate Partner Violence: Not At Risk (09/23/2021)   Humiliation, Afraid, Rape, and Kick questionnaire    Fear of Current or Ex-Partner: No    Emotionally Abused: No    Physically Abused: No    Sexually Abused: No    FH: No family history on file.  Past Medical History:  Diagnosis Date   Cancer [facility])    prostate   Diabetes mellitus without complication (HCC)    Hypertension     Current Outpatient Medications  Medication Sig Dispense Refill   amiodarone (PACERONE) 200 MG tablet Take 200 mg by mouth daily.     apixaban (ELIQUIS) 5 MG TABS tablet Take 1 tablet (5 mg total) by mouth 2 (two) times daily. 60 tablet 5  atorvastatin (LIPITOR) 80 MG tablet Take 1 tablet (80 mg total) by mouth daily. 30 tablet 5   clopidogrel (PLAVIX) 75 MG tablet Take 1 tablet (75 mg total) by mouth daily. 30 tablet 5   dorzolamide (TRUSOPT) 2 % ophthalmic solution Place 1 drop into both eyes in the morning.     DULoxetine (CYMBALTA) 30 MG capsule Take 30 mg by mouth daily.     insulin glargine (LANTUS) 100 UNIT/ML injection Inject 8-20 Units into the skin at bedtime.     latanoprost (XALATAN) 0.005 % ophthalmic solution Place 1 drop into both eyes at bedtime.     metFORMIN (GLUCOPHAGE) 850 MG tablet Take 850 mg by mouth in the morning and at bedtime.     propranolol (INDERAL) 10 MG tablet Take  10 mg by mouth 2 (two) times daily.     sodium zirconium cyclosilicate (LOKELMA) 10 g PACK packet Take 10 g by mouth daily for 1 day, THEN 10 g as directed. (Patient taking differently: Take 10 g by mouth daily for 1 day, THEN 10 g as directed. Last use 10/13/21. ) 5 packet 0   No current facility-administered medications for this encounter.    Vitals:   10/20/21 1045  BP: (!) 146/78  Pulse: 65  SpO2: 99%  Weight: 65.2 kg (143 lb 12.8 oz)   Wt Readings from Last 3 Encounters:  10/20/21 65.2 kg (143 lb 12.8 oz)  10/05/21 63.2 kg (139 lb 6.4 oz)  09/25/21 68.2 kg (150 lb 5.7 oz)    PHYSICAL EXAM: General:  Walked in the clinic. No resp difficulty HEENT: normal Neck: supple. no JVD. Carotids 2+ bilat; no bruits. No lymphadenopathy or thryomegaly appreciated. Cor: PMI nondisplaced. Regular rate & rhythm. No rubs, gallops or murmurs. Lungs: clear Abdomen: soft, nontender, nondistended. No hepatosplenomegaly. No bruits or masses. Good bowel sounds. Extremities: no cyanosis, clubbing, rash, edema Neuro: alert & orientedx3, cranial nerves grossly intact. moves all 4 extremities w/o difficulty. Affect pleasant  EKG: SR 64 bpm   ASSESSMENT & PLAN:  1. Chronic systolic CHF/iCM - Cardiogenic shock 08/23 due primarily to RV infarct in setting of late-presenting RCA STEMI. Required inotrope support with DBA. - Echo 08/23 EF 30-35% RV moderately reduced trivial MR - NYHA II-III. Functionally doing better. Reds Clip 31%. Not suggestive of fluid accumulation.  -Will not place on diuretics.  Not requiring loop diuretic - Off dig last visit d/t dig level 1.6.  - On propanolol for hx tremor - No SGLT2i for now with significant weight loss. Consider SGLT2i next visit.  - No Spiro/entresto with K >6.   2. 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 Adding protonix today.   Continue high-intensity statin. - No current angina - For now will delay PCI LM/LAD until several weeks/months down the road d/w Dr. Burt Knack  3. Recent AKI  - Scr peaked at 1.80 during recent admit in setting of shock, down to 1.15 at discharge -Resolved.  - Check BMET   4. DM2, poorly controlled - HGBa1c 9.5% - On metformin and insulin - Planning to establish with Dr. Renne Crigler with Endocrine  5. PAF/AFL - in and out since recent admit, AFL with controlled rate today - continue amiodarone '200mg'$  daily - 3 day zio monitor . A flutter burden 29%. May refer to EP for consideration of ablation. Will discuss with Dr Haroldine Laws.  - continue Eliquis  5 BID  6. Failure to thrive - Amiodarone could be contributing, recently cut back dose to 200 mg daily 3 days ago. Monitor. -Check TSH today.  - GI for swallowing issues. Suspect esophageal stricture. - Add protonix today   7. Hyperkalemia  K has been running > 5 Check BMET  8. OSA Unable to wear CPAP. In the past he saw Dr Radford Pax. I would like to send him back to her to discuss possible options.   Check BMET --> K stable 4.8 Creatinine 1.2 stable.  Should be able to start cardiac rehab today.   Follow up in 2 weeks with pharmacy and 6-8 weeks with pharmacy.   Graylon Amory NP-C  9:41 AM

## 2021-10-20 NOTE — Progress Notes (Signed)
ReDS Vest / Clip - 10/20/21 1100       ReDS Vest / Clip   Station Marker C    Ruler Value 31    ReDS Value Range Low volume    ReDS Actual Value 28

## 2021-10-20 NOTE — Patient Instructions (Signed)
Medication Changes:  START Protonix 40 mg Daily  Lab Work:  Labs done today, your results will be available in MyChart, we will contact you for abnormal readings.  Testing/Procedures:  none  Referrals:  none  Special Instructions // Education:  Do the following things EVERYDAY: Weigh yourself in the morning before breakfast. Write it down and keep it in a log. Take your medicines as prescribed Eat low salt foods--Limit salt (sodium) to 2000 mg per day.  Stay as active as you can everyday Limit all fluids for the day to less than 2 liters  Follow-Up in:   Please follow up with our heart failure pharmacist in 2-3 weeks Your physician recommends that you schedule a follow-up appointment in: 8 weeks with Dr Haroldine Laws    At the [facility], you and your health needs are our priority. We have a designated team specialized in the treatment of Heart Failure. This Care Team includes your primary Heart Failure Specialized Cardiologist (physician), Advanced Practice Providers (APPs- Physician Assistants and Nurse Practitioners), and Pharmacist who all work together to provide you with the care you need, when you need it.   You may see any of the following providers on your designated Care Team at your next follow up:  Dr. Glori Bickers Dr. Loralie Champagne Dr. Roxana Hires, NP Lyda Jester, [place] [facility] [place], [place] Forestine Na, NP Audry Riles, PharmD   Please be sure to bring in all your medications bottles to every appointment.   Need to Contact us:  If you have any questions or concerns before your next appointment please send [place] a message through [place] or call our office at [phone].    TO LEAVE A MESSAGE FOR THE NURSE SELECT OPTION 2, PLEASE LEAVE A MESSAGE INCLUDING: YOUR NAME DATE OF BIRTH CALL BACK NUMBER REASON FOR CALL**this is important as we prioritize the call backs  YOU WILL RECEIVE A CALL BACK THE  SAME DAY AS LONG AS YOU CALL BEFORE 4:00 PM

## 2021-10-21 ENCOUNTER — Encounter (HOSPITAL_COMMUNITY): Payer: Self-pay | Admitting: Physical Therapy

## 2021-10-21 ENCOUNTER — Other Ambulatory Visit (HOSPITAL_COMMUNITY): Payer: Self-pay | Admitting: *Deleted

## 2021-10-21 ENCOUNTER — Ambulatory Visit (HOSPITAL_COMMUNITY): Payer: PPO | Attending: Internal Medicine | Admitting: Physical Therapy

## 2021-10-21 ENCOUNTER — Other Ambulatory Visit (HOSPITAL_COMMUNITY): Payer: Self-pay

## 2021-10-21 DIAGNOSIS — I2111 ST elevation (STEMI) myocardial infarction involving right coronary artery: Secondary | ICD-10-CM | POA: Diagnosis not present

## 2021-10-21 DIAGNOSIS — I213 ST elevation (STEMI) myocardial infarction of unspecified site: Secondary | ICD-10-CM | POA: Diagnosis not present

## 2021-10-21 DIAGNOSIS — M6281 Muscle weakness (generalized): Secondary | ICD-10-CM | POA: Diagnosis not present

## 2021-10-21 MED ORDER — ATORVASTATIN CALCIUM 80 MG PO TABS: 80.0000 mg | ORAL_TABLET | Freq: Every day | ORAL | 5 refills | Status: DC

## 2021-10-21 MED ORDER — AMIODARONE HCL 200 MG PO TABS: 200.0000 mg | ORAL_TABLET | Freq: Every day | ORAL | 6 refills | Status: DC

## 2021-10-21 MED ORDER — CLOPIDOGREL BISULFATE 75 MG PO TABS: 75.0000 mg | ORAL_TABLET | Freq: Every day | ORAL | 5 refills | Status: DC

## 2021-10-21 MED ORDER — APIXABAN 5 MG PO TABS: 5.0000 mg | ORAL_TABLET | Freq: Two times a day (BID) | ORAL | 5 refills | Status: DC

## 2021-10-21 NOTE — Therapy (Signed)
OUTPATIENT PHYSICAL THERAPY LOWER EXTREMITY EVALUATION   Patient Name: Tommy Graham MRN: [id number] DOB:12/20/1940, 81 y.o., male Today's Date: 10/21/2021   PT End of Session - 10/21/21 1341     Visit Number 1    Number of Visits 1    Date for PT Re-Evaluation 10/21/21    Authorization Type [employer]    PT Start Time 1305    PT Stop Time 1343    PT Time Calculation (min) 38 min    Activity Tolerance Patient tolerated treatment well;Patient limited by fatigue    Behavior During Therapy [facility] for tasks assessed/performed             Past Medical History:  Diagnosis Date   Cancer ([place])    prostate   Diabetes mellitus without complication ([place])    Hypertension    Past Surgical History:  Procedure Laterality Date   CERVICAL SPINE SURGERY     CORONARY STENT INTERVENTION N/A 09/17/2021   Procedure: CORONARY STENT INTERVENTION;  Surgeon: Sherren Mocha, MD;  Location: [place] CV LAB;  Service: Cardiovascular;  Laterality: N/A;   LEFT HEART CATH AND CORONARY ANGIOGRAPHY N/A 09/17/2021   Procedure: LEFT HEART CATH AND CORONARY ANGIOGRAPHY;  Surgeon: Sherren Mocha, MD;  Location: [place] CV [facility];  Service: Cardiovascular;  Laterality: N/A;   PROSTATE SURGERY     RIGHT HEART CATH N/A 09/17/2021   Procedure: RIGHT HEART CATH;  Surgeon: Sherren Mocha, MD;  Location: [place] CV [facility];  Service: Cardiovascular;  Laterality: N/A;   ROTATOR CUFF REPAIR Bilateral    Patient Active Problem List   Diagnosis Date Noted   SOB (shortness of breath) 10/05/2021   STEMI involving right coronary artery ([place]) 09/17/2021   DIABETES MELLITUS, TYPE II 09/03/2006   HYPERTENSION 09/03/2006    PCP: Gar Ponto MD  REFERRING PROVIDER: Forestine Na NP  REFERRING DIAG: I21.3 (ICD-10-CM) - ST elevation myocardial infarction (STEMI), unspecified artery (HCC) I21.11 (ICD-10-CM) - STEMI involving right coronary artery ([place])   THERAPY DIAG:  Muscle weakness  (generalized)  Rationale for Evaluation and Treatment Rehabilitation  ONSET DATE: last year   SUBJECTIVE:   SUBJECTIVE STATEMENT: Patient presents to therapy with primary complaint of difficulty swallowing and acid reflux. He was referred for weakness following MI. He had cardiac stents placed the end of August. He has not had formal therapy at this point. He feels he is doing better and has been walking his driveway as he feels better to do so. He does not report any direct issued with physical function at this time but was referred to therapy for" 1 or 2 sessions" before starting cardiac rehab.   PERTINENT HISTORY: MI  PAIN:  Are you having pain? No  PRECAUTIONS: Other: 10lb lifting restriction   WEIGHT BEARING RESTRICTIONS No  FALLS:  Has patient fallen in last 6 months? No  LIVING ENVIRONMENT: Lives with: lives with their spouse Lives in: House/apartment Stairs: Yes: External: 1 steps; none Has following equipment at home: Environmental consultant - 2 wheeled  OCCUPATION: Retired   PLOF: [place] Not sure    OBJECTIVE:   DIAGNOSTIC FINDINGS: NA  COGNITION:  Overall cognitive status: Within functional limits for tasks assessed     SENSATION: WFL   LOWER EXTREMITY ROM:  Bilateral LE AROM WFL   LOWER EXTREMITY MMT:  MMT Right eval Left eval  Hip flexion 4+ 4+  Hip extension (Limited ROM) (Limited ROM)  Hip abduction 4+ 4+  Hip adduction  Hip internal rotation    Hip external rotation    Knee flexion    Knee extension 5 5  Ankle dorsiflexion 4+ 4+  Ankle plantarflexion    Ankle inversion    Ankle eversion     (Blank rows = not tested)   FUNCTIONAL TESTS:  5 times sit to stand: 16.5 sec with no UE 2 minute walk test: 375 feet with no AD   GAIT: Distance walked: 375 feet Assistive device utilized: None Level of assistance: Complete Independence Comments: slight decreased stride    TODAY'S TREATMENT: Eval 5 x STS 2 MWT MMT    Balance: tandem stance > 30 sec bilateral   PATIENT EDUCATION:  Education details: on eval findings, and HEP  Person educated: Patient Education method: Explanation Education comprehension: verbalized understanding   HOME EXERCISE PROGRAM: Access Code: [email or web address] URL: [email or web address] Date: 10/21/2021 Prepared by: Josue Hector  Exercises - Sit to Stand with Counter Support  - 1-2 x daily - 7 x weekly - 2-3 sets - 5 reps - Heel Raises with Counter Support  - 1-2 x daily - 7 x weekly - 2 sets - 10 reps - Side Stepping with Counter Support  - 1-2 x daily - 7 x weekly - 1 sets - 10 reps  ASSESSMENT:  CLINICAL IMPRESSION: Patient is a 81 y.o. male who was seen today for physical therapy evaluation and treatment for weakness following MI. Patient reports his physical function is actually better at this point than it was prior to his MI. Objective measures show good LE strength and balance and fairly normal functional mobility for his age range. Patients chief complaint during today's testing was fatigue. Discussed basic LE strengthening program for home and issue handout. At this time, patient would likely be best served to begin cardiac rehab for conditioning. Patient agrees with this plan. Encouraged patient to follow up with therapy services with any further question or concerns.    OBJECTIVE IMPAIRMENTS decreased activity tolerance.   ACTIVITY LIMITATIONS locomotion level  PARTICIPATION LIMITATIONS: cleaning, community activity, and yard work  PERSONAL FACTORS Age are also affecting patient's functional outcome.   REHAB POTENTIAL: Good  CLINICAL DECISION MAKING: Stable/uncomplicated  EVALUATION COMPLEXITY: Low   GOALS: 1 x Eval only  PLAN: PT FREQUENCY: 1x/week  PT DURATION: 1 week  PLANNED INTERVENTIONS: Therapeutic exercises, Therapeutic activity, Neuromuscular re-education, Balance training, Gait training, Patient/Family education, Joint  manipulation, Joint mobilization, Stair training, Aquatic Therapy, Dry Needling, Electrical stimulation, Spinal manipulation, Spinal mobilization, Cryotherapy, Moist heat, scar mobilization, Taping, Traction, Ultrasound, Biofeedback, Ionotophoresis '4mg'$ /ml Dexamethasone, and Manual therapy.   PLAN FOR NEXT SESSION: 1 x eval only   1:42 PM, 10/21/21 Josue Hector PT DPT  Physical Therapist with [facility]  [phone]

## 2021-10-25 DIAGNOSIS — I5030 Unspecified diastolic (congestive) heart failure: Secondary | ICD-10-CM | POA: Diagnosis not present

## 2021-10-25 DIAGNOSIS — K2100 Gastro-esophageal reflux disease with esophagitis, without bleeding: Secondary | ICD-10-CM | POA: Diagnosis not present

## 2021-10-25 DIAGNOSIS — I2119 ST elevation (STEMI) myocardial infarction involving other coronary artery of inferior wall: Secondary | ICD-10-CM | POA: Diagnosis not present

## 2021-10-25 DIAGNOSIS — I480 Paroxysmal atrial fibrillation: Secondary | ICD-10-CM | POA: Diagnosis not present

## 2021-10-25 DIAGNOSIS — F331 Major depressive disorder, recurrent, moderate: Secondary | ICD-10-CM | POA: Diagnosis not present

## 2021-10-28 NOTE — Progress Notes (Signed)
Advanced Heart Failure Clinic Note   Primary HF Cardiologist: Dr Haroldine Laws Sleep Cardiology: Dr Radford Pax     HPI:  81 y.o. male with hx uncontrolled DM II, HTN, HLD. Admitted 09/17/21 with late presenting inferolateral STEMI with RV involvement c/b cardiogenic shock in setting of RV failure. Found to have proximal occlusion of RCA and 75% d LM/ostial LAD. Underwent emergent PCI/DES to RCA. Staged PCI LM/LAD to be planned at a later date. EF ~ 45%.    Hemodynamics c/w low-output RV failure. Initially on Milrinone for inotrope support, later switched to DBA/NE d/t persistent low-output. Developed AKI and shock liver. Renal function and LFTs were followed closely and improved with hemodynamic support. He was eventually weaned off inotropes and maintained stable CO-OX. GDMT titrated as tolerated. Course c/b paroxysmal AF. Loaded with IV amiodarone, in and out of a fib. Rate controlled.  Amiodarone loading to be continued after discharge.   Zio 10/05/21 in 29% A flutter.    He was seen in the HF clinic 10/05/21 and was feeling bad due to fatigue./ K 6.3 Creatinine 1.5. Digoxin , spironolactone, and losartan stopped. Given Lokelma.     Returned to [facility] for HF follow up 10/20/21. Felt much better and had been walking 150 feet 2-3 times a day. Noted ongoing difficulty swallowing. Complained of reflux. No chest pain.  Stated he still gets SOB after walking up inclines but not as bad. Denied PND/Orthopnea. Appetite was ok. No fever or chills. Weight at home was 136-140 pounds. Reported taking all medications.  Today he returns to HF clinic for pharmacist medication titration. At last visit with APP, pantoprazole was added for reflux. No heart failure medication changes were made. Patient presents today with his wife. He has been having many difficulties since his MI in August. One of his main complaints is difficulty swallowing. He also has a terrible, slightly metallic, taste in his mouth. Looking  at food makes him sick. Has difficulty swallowing medications and food. Has been crushing his pills and taking with applesauce. He is very concerned that he is losing weight because he isn't eating much because of these issues. Has been using Ensure. He also wakes up in the mornings with a white coating on his tongue and is worried he has thrush again (plans to see PCP for fluconazole). He also is having issues with Afib. Says he is normally able to walk up his driveway and back 6 times (roughly 1800 feet total) before taking a break. However, when he reverts back to Afib he gets extremely fatigued and SOB. States he had to stop and rest when walking from the parking garage to the elevator. He also feels like he goes into Afib every morning, ~4:30-5am. This wakes him up and makes him SOB. Weight is stable from his last clinic visit at 143.8 lbs. However, he is very concerned that he is losing weight again. States he had made it to 142 lbs on his home scale, but is down to 139.6 lbs again. Does not take a diuretic. No LEE, PND or orthopnea.    Does the patient have any problems obtaining medications due to transportation or finances?   No; [employer] insurance.   Understanding of regimen: good Understanding of indications: good Potential of compliance: good Patient understands to avoid NSAIDs. Patient understands to avoid decongestants.    Pertinent Lab Values: 10/20/21: Serum creatinine 1.22, BUN 16, Potassium 4.8, Sodium 137  Vital Signs: Weight: 143.8 lbs (last clinic weight:  143.8 lbs) Blood pressure: 136/78  Heart rate: 73   Assessment/Plan: 1. Chronic systolic CHF/iCM - Cardiogenic shock 08/2021 due primarily to RV infarct in setting of late-presenting RCA STEMI. Required inotrope support with DBA. - Echo 08/2021 EF 30-35% RV moderately reduced trivial MR - NYHA II-III. Euvolemic on exam.  - Not requiring loop diuretic - Digoxin recently discontinued with elevated level 1.6  ng/mL - On propanolol for hx tremor - No SGLT2i for now with significant weight loss. Consider SGLT2i next visit if weight remains stable. We discussed today.   - No Spironolactone/Entresto for now with K recently >6.     2. 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 Continue protonix. - Continue high-intensity statin. - No current angina - For now will delay PCI LM/LAD until several weeks/months down the road d/w Dr. Burt Knack   3. Recent AKI  - Scr peaked at 1.80 during recent admit in setting of shock, down to 1.15 at discharge -Scr 1.22 on BMET 10/20/21   4. DM2, poorly controlled - HGBa1c 9.5% - On metformin and insulin - Planning to establish with Dr. Renne Crigler with Endocrine. He has not yet heard anything from their office. Will see if a new referral needs to be placed.    5. PAF/AFL - in and out since recent admit,  controlled rate today - continue amiodarone 200 mg daily - 3 day zio monitor . A flutter burden 29%.  - Continue Eliquis 5 mg BID   6. Failure to thrive - Amiodarone could be contributing, recently cut back dose to 200 mg daily. Monitor.  - GI for swallowing issues. Suspect esophageal stricture. - Continue pantoprazole.    7. Hyperkalemia  -K has been running > 5, down to 4.8 on 10/20/21 BMET   8. OSA Unable to wear CPAP. In the past he saw Dr Radford Pax. I would like to send him back to her to discuss possible options.   Follow up 1 month with Dr. Ballard Russell, PharmD, BCPS, [facility], [place] [facility] Pharmacist [phone]

## 2021-10-29 ENCOUNTER — Telehealth: Payer: Self-pay | Admitting: *Deleted

## 2021-10-29 ENCOUNTER — Ambulatory Visit (INDEPENDENT_AMBULATORY_CARE_PROVIDER_SITE_OTHER): Payer: PPO | Admitting: Gastroenterology

## 2021-10-29 ENCOUNTER — Encounter (INDEPENDENT_AMBULATORY_CARE_PROVIDER_SITE_OTHER): Payer: Self-pay | Admitting: Gastroenterology

## 2021-10-29 ENCOUNTER — Encounter (HOSPITAL_COMMUNITY): Payer: Self-pay

## 2021-10-29 VITALS — BP 145/76 | HR 76 | Ht 69.0 in | Wt 145.3 lb

## 2021-10-29 DIAGNOSIS — K219 Gastro-esophageal reflux disease without esophagitis: Secondary | ICD-10-CM | POA: Insufficient documentation

## 2021-10-29 DIAGNOSIS — R1310 Dysphagia, unspecified: Secondary | ICD-10-CM

## 2021-10-29 MED ORDER — PANTOPRAZOLE SODIUM 40 MG PO TBEC: 40.0000 mg | DELAYED_RELEASE_TABLET | Freq: Two times a day (BID) | ORAL | 3 refills | Status: AC

## 2021-10-29 NOTE — Patient Instructions (Signed)
Please increase your protonix to twice a day, I will send updated prescription for this We will reach out to your cardiologist about timing of upper endoscopy given recent heart attack and starting on eliquis. Please continue to take small bites, chew thoroughly and take sips of liquids between bites, avoid thicker drier foods like meat and bread. Increase protein shakes to twice daily, one in the morning and one in the evening.  Follow up 3 months

## 2021-10-29 NOTE — Telephone Encounter (Signed)
   Pre-operative Risk Assessment    Patient Name: Tommy Graham  DOB: 1940/03/05 MRN: [id number]      Request for Surgical Clearance    Procedure:   UPPER ENDOSCOPY WITH POSSIBLE DILATION   Date of Surgery:  Clearance TBD                                 Surgeon:  DR. DANIEL CASTANEDA  Surgeon's Group or Practice Name:  [facility] Phone number:  [phone] Fax number:  [phone]   Type of Clearance Requested:   - Medical  - Pharmacy:  Hold Apixaban (Eliquis) x 2 DAYS PRIOR   Type of Anesthesia:  MAC   Additional requests/questions:    Jiles Prows   10/29/2021, 2:16 PM

## 2021-10-29 NOTE — Progress Notes (Addendum)
Referring Provider: Caryl Bis, MD Primary Care Physician:  Caryl Bis, MD Primary GI Physician: new  Chief Complaint  Patient presents with   Dysphagia    Patient here today for issues with swallowing. Patient also has issues with gerd.Patient is taking protonix 40 mg daily, which is not of much help.   HPI:   Tommy Graham is a 81 y.o. male with past medical history of prostate cancer, DM, HTN, HLD, MI (august 2023), A flutter, OSA.  Patient presenting today as a new patient for GERD/Dysphagia.  Today, patient reports recent MI in august, lost about 15 lbs at that time, though notes some gradual weight loss over the past few years. Has gained back about 5 pounds of the 15 he lost, though total over the past few years has lost about 20 pounds. He has long hx of GERD. For the past 2-3 years he has had dysphagia, states that this has gradually worsened. He has issues with anything he eats and taking pills. Tolerates liquids okay. Food sticks at sternal notch. No episodes of food impaction. He wakes up in the mornings with a white coating on his tongue and a bad taste in his mouth. He currently on protonix '40mg'$  daily but does not see an improvement with this. He denies heartburn but belches a lot. He has a dry cough but no sore throat. Sometimes has hoarseness. He has some burning in his stomach on occasion. Denies nausea or vomiting. No changes in bowel habits or blood in stool.   NSAID use: none  Social hx:no etoh or tobacco  Fam hx: denies pertinent hx   Last Colonoscopy:>10 years ago   Last Endoscopy:thinks he may have one of these previously  Recommendations:    Past Medical History:  Diagnosis Date   Cancer ([place])    prostate   Diabetes mellitus without complication ([place])    Hypertension     Past Surgical History:  Procedure Laterality Date   CERVICAL SPINE SURGERY     CORONARY STENT INTERVENTION N/A 09/17/2021   Procedure: CORONARY STENT INTERVENTION;   Surgeon: Sherren Mocha, MD;  Location: [facility];  Service: Cardiovascular;  Laterality: N/A;   LEFT HEART CATH AND CORONARY ANGIOGRAPHY N/A 09/17/2021   Procedure: LEFT HEART CATH AND CORONARY ANGIOGRAPHY;  Surgeon: Sherren Mocha, MD;  Location: [facility];  Service: Cardiovascular;  Laterality: N/A;   PROSTATE SURGERY     RIGHT HEART CATH N/A 09/17/2021   Procedure: RIGHT HEART CATH;  Surgeon: Sherren Mocha, MD;  Location: [facility];  Service: Cardiovascular;  Laterality: N/A;   ROTATOR CUFF REPAIR Bilateral     Current Outpatient Medications  Medication Sig Dispense Refill   amiodarone (PACERONE) 200 MG tablet Take 1 tablet (200 mg total) by mouth daily. 30 tablet 6   apixaban (ELIQUIS) 5 MG TABS tablet Take 1 tablet (5 mg total) by mouth 2 (two) times daily. 60 tablet 5   atorvastatin (LIPITOR) 80 MG tablet Take 1 tablet (80 mg total) by mouth daily. 30 tablet 5   clopidogrel (PLAVIX) 75 MG tablet Take 1 tablet (75 mg total) by mouth daily. 30 tablet 5   dorzolamide (TRUSOPT) 2 % ophthalmic solution Place 1 drop into both eyes in the morning.     DULoxetine (CYMBALTA) 30 MG capsule Take 30 mg by mouth in the morning.     insulin glargine (LANTUS) 100 UNIT/ML injection Inject 8-20 Units into the skin at bedtime.  latanoprost (XALATAN) 0.005 % ophthalmic solution Place 1 drop into both eyes at bedtime.     metFORMIN (GLUCOPHAGE) 850 MG tablet Take 850 mg by mouth in the morning and at bedtime. (Morning & afternoon)     pantoprazole (PROTONIX) 40 MG tablet Take 1 tablet (40 mg total) by mouth daily. 30 tablet 11   propranolol (INDERAL) 10 MG tablet Take 10 mg by mouth 2 (two) times daily.     sertraline (ZOLOFT) 25 MG tablet Take 25 mg by mouth in the morning.     No current facility-administered medications for this visit.    Allergies as of 10/29/2021 - Review Complete 10/29/2021  Allergen Reaction Noted   Diphenhydramine hcl     Nifedipine       History reviewed. No pertinent family history.  Social History   Socioeconomic History   Marital status: Married    Spouse name: Not on file   Number of children: Not on file   Years of education: Not on file   Highest education level: Not on file  Occupational History   Not on file  Tobacco Use   Smoking status: Never   Smokeless tobacco: Never  Vaping Use   Vaping Use: Never used  Substance and Sexual Activity   Alcohol use: Never   Drug use: Never   Sexual activity: Not on file  Other Topics Concern   Not on file  Social History Narrative   Not on file   Social Determinants of Health   Financial Resource Strain: Not on file  Food Insecurity: No Food Insecurity (09/23/2021)   Hunger Vital Sign    Worried About Running Out of Food in the Last Year: Never true    Ran Out of Food in the Last Year: Never true  Transportation Needs: No Transportation Needs (09/23/2021)   PRAPARE - Hydrologist (Medical): No    Lack of Transportation (Non-Medical): No  Physical Activity: Not on file  Stress: Not on file  Social Connections: Not on file   Review of systems General: negative for malaise, night sweats, fever, chills, +weight loss Neck: Negative for lumps, goiter, pain and significant neck swelling Resp: Negative for cough, wheezing, dyspnea at rest CV: Negative for chest pain, leg swelling, palpitations, orthopnea +weight loss +dysphagia MSK: Negative for joint pain or swelling, back pain, and muscle pain. Derm: Negative for itching or rash Psych: Denies depression, anxiety, memory loss, confusion. No homicidal or suicidal ideation.  Heme: Negative for prolonged bleeding, bruising easily, and swollen nodes. Endocrine: Negative for cold or heat intolerance, polyuria, polydipsia and goiter. Neuro: negative for tremor, gait imbalance, syncope and seizures. The remainder of the review of systems is noncontributory.  Physical Exam: BP (!)  145/76 (BP Location: Left Arm, Patient Position: Sitting, Cuff Size: Large)   Pulse 76   Ht '5\' 9"'$  (1.753 m)   Wt 145 lb 4.8 oz (65.9 kg)   BMI 21.46 kg/m  General:   Alert and oriented. No distress noted. Pleasant and cooperative.  Head:  Normocephalic and atraumatic. Eyes:  Conjuctiva clear without scleral icterus. Mouth:  Oral mucosa pink and moist. Good dentition. No lesions. Heart: Normal rate and rhythm, s1 and s2 heart sounds present.  Lungs: Clear lung sounds in all lobes. Respirations equal and unlabored. Abdomen:  +BS, soft, non-tender and non-distended. No rebound or guarding. No HSM or masses noted. Derm: No palmar erythema or jaundice Msk:  Symmetrical without gross deformities. Normal posture. Extremities:  Without edema. Neurologic:  Alert and  oriented x4 Psych:  Alert and cooperative. Normal mood and affect.  Invalid input(s): "6 MONTHS"   ASSESSMENT: Tommy Graham is a 81 y.o. male presenting today as a new patient for dysphagia and GERD.  Patient with recent MI, started on eliquis and plavix in august presenting with long history of GERD and 2-3 years history of dysphagia that has progressively worsened. No episodes of food impaction. Down approx 10 pounds now since recent hospitalization. Notes avoiding eating due to fear of choking. Started on Protonix '40mg'$  daily by cards without much improvement in symptoms. Denies frequent GERD symptoms but has occasional burning to his stomach. Recommend proceeding with EGD for further evaluation as we cannot rule out esophageal ring, web, stricture, or malignancy.  due to patient's recent MI and initiation of Eliquis, we will need to reach out to cardiology regarding time frame for proceeding with endoscopic procedure, I discussed this with the patient and his wife. In the meantime, we will increase protonix to '40mg'$  BID as he is on plavix, other PPI therapy options are limited. He should avoid thicker, drier foods, chew thoroughly and  take sips of liquid between bites. I encouraged him to increase protein shakes to BID, one in the morning and one in the evening before bed, he should liberalize his diet and eat anything well tolerated and be mindful of good blood sugar control in the meantime as he is diabetic.   Indications, risks and benefits of procedure discussed in detail with patient. Patient verbalized understanding and is in agreement to proceed with EGD once cleared by cardiology.    PLAN:  EGD +/- dilation, will need cardiac clearance (Dr. Burt Knack) due to recent MI and initiaton of Eliquis** 2. Increase Protonix '40mg'$  BID  3. Do protein shakes 2-3 x per day  4. Avoid thicker drier/foods, chewing precautions 5. Protein shakes BID  All questions were answered, patient verbalized understanding and is in agreement with plan as outlined above.   Follow Up: 3 months  Loretta Doutt L. Alver Sorrow, MSN, APRN, AGNP-C Adult-Gerontology Nurse Practitioner [facility]  I have reviewed the note and agree with the APP's assessment as described in this progress note  Maylon Peppers, MD Gastroenterology and Hepatology [facility]

## 2021-11-03 ENCOUNTER — Encounter (HOSPITAL_COMMUNITY): Admission: RE | Admit: 2021-11-03 | Payer: PPO | Source: Ambulatory Visit

## 2021-11-04 DIAGNOSIS — C44311 Basal cell carcinoma of skin of nose: Secondary | ICD-10-CM | POA: Insufficient documentation

## 2021-11-04 NOTE — Telephone Encounter (Signed)
Patient with diagnosis of PAF on Eliquis for anticoagulation.    Procedure: UPPER ENDOSCOPY WITH POSSIBLE DILATION    Date of procedure: TBD   CHA2DS2-VASc Score = 6  This indicates a 9.7% annual risk of stroke. The patient's score is based upon: CHF History: 1 HTN History: 1 Diabetes History: 1 Stroke History: 0 Vascular Disease History: 1 Age Score: 2 Gender Score: 0    CrCl 44 mL/min Platelet count 684K   Per office protocol, patient can hold Eliquis for 2 days prior to procedure.     **This guidance is not considered finalized until pre-operative APP has relayed final recommendations.**

## 2021-11-04 NOTE — Telephone Encounter (Addendum)
   Patient Name: Tommy Graham  DOB: 1940/04/25 MRN: [id number]  Primary Cardiologist: Sherren Mocha, MD  Chart reviewed as part of pre-operative protocol coverage. Patient saw Darrick Grinder, NP on 10/20/2021. Per Darrick Grinder and Dr Haroldine Laws, Tommy Graham is at acceptable risk for the planned procedure without further cardiovascular testing.  Per Darrick Grinder, NP "he had DES to RCA on 09/17/21. He cant come off plavix for 6 months and has been in and out of A Fib. He could briefly come off eliquis but would be at risk. Discussed with Dr. Haroldine Laws."   Pharmacy reviewed as well. Per office protocol, patient can hold Eliquis for 2 days prior to procedure.      I will route this recommendation to the requesting party via Epic fax function and remove from pre-op pool.  Please call with questions.  Lenna Sciara, NP 11/04/2021, 11:21 AM

## 2021-11-09 ENCOUNTER — Ambulatory Visit (HOSPITAL_COMMUNITY)
Admission: RE | Admit: 2021-11-09 | Discharge: 2021-11-09 | Disposition: A | Discharge status: Home / Self Care | Payer: PPO | Source: Ambulatory Visit | Attending: Internal Medicine | Admitting: Internal Medicine

## 2021-11-09 VITALS — BP 136/78 | HR 73 | Wt 143.8 lb

## 2021-11-09 DIAGNOSIS — Z7984 Long term (current) use of oral hypoglycemic drugs: Secondary | ICD-10-CM | POA: Diagnosis not present

## 2021-11-09 DIAGNOSIS — Z794 Long term (current) use of insulin: Secondary | ICD-10-CM | POA: Insufficient documentation

## 2021-11-09 DIAGNOSIS — G4733 Obstructive sleep apnea (adult) (pediatric): Secondary | ICD-10-CM | POA: Diagnosis not present

## 2021-11-09 DIAGNOSIS — I252 Old myocardial infarction: Secondary | ICD-10-CM | POA: Insufficient documentation

## 2021-11-09 DIAGNOSIS — I5022 Chronic systolic (congestive) heart failure: Secondary | ICD-10-CM | POA: Diagnosis not present

## 2021-11-09 DIAGNOSIS — I480 Paroxysmal atrial fibrillation: Secondary | ICD-10-CM | POA: Insufficient documentation

## 2021-11-09 DIAGNOSIS — I2510 Atherosclerotic heart disease of native coronary artery without angina pectoris: Secondary | ICD-10-CM | POA: Insufficient documentation

## 2021-11-09 DIAGNOSIS — Z7901 Long term (current) use of anticoagulants: Secondary | ICD-10-CM | POA: Insufficient documentation

## 2021-11-09 DIAGNOSIS — E118 Type 2 diabetes mellitus with unspecified complications: Secondary | ICD-10-CM | POA: Diagnosis not present

## 2021-11-09 DIAGNOSIS — I4892 Unspecified atrial flutter: Secondary | ICD-10-CM | POA: Insufficient documentation

## 2021-11-09 DIAGNOSIS — I110 Hypertensive heart disease with heart failure: Secondary | ICD-10-CM | POA: Diagnosis not present

## 2021-11-09 DIAGNOSIS — E785 Hyperlipidemia, unspecified: Secondary | ICD-10-CM | POA: Diagnosis not present

## 2021-11-09 DIAGNOSIS — Z8679 Personal history of other diseases of the circulatory system: Secondary | ICD-10-CM | POA: Diagnosis not present

## 2021-11-09 DIAGNOSIS — E875 Hyperkalemia: Secondary | ICD-10-CM | POA: Insufficient documentation

## 2021-11-09 DIAGNOSIS — Z7902 Long term (current) use of antithrombotics/antiplatelets: Secondary | ICD-10-CM | POA: Diagnosis not present

## 2021-11-09 DIAGNOSIS — Z79899 Other long term (current) drug therapy: Secondary | ICD-10-CM | POA: Insufficient documentation

## 2021-11-09 DIAGNOSIS — R1310 Dysphagia, unspecified: Secondary | ICD-10-CM | POA: Diagnosis not present

## 2021-11-09 DIAGNOSIS — R627 Adult failure to thrive: Secondary | ICD-10-CM | POA: Insufficient documentation

## 2021-11-09 NOTE — Patient Instructions (Signed)
It was a pleasure seeing you today!  MEDICATIONS: -No medication changes today -Call if you have questions about your medications.  LABS: -We will call you if your labs need attention.  NEXT APPOINTMENT: Return to clinic in 1 month with Dr. Haroldine Laws.  In general, to take care of your heart failure: -Limit your fluid intake to 2 Liters (half-gallon) per day.   -Limit your salt intake to ideally 2-3 grams (2000-3000 mg) per day. -Weigh yourself daily and record, and bring that "weight diary" to your next appointment.  (Weight gain of 2-3 pounds in 1 day typically means fluid weight.) -The medications for your heart are to help your heart and help you live longer.   -Please contact us before stopping any of your heart medications.  Call the clinic at [phone] with questions or to reschedule future appointments.

## 2021-11-10 ENCOUNTER — Encounter (HOSPITAL_COMMUNITY): Payer: Self-pay

## 2021-11-10 ENCOUNTER — Encounter (HOSPITAL_COMMUNITY)
Admission: RE | Admit: 2021-11-10 | Discharge: 2021-11-10 | Disposition: A | Discharge status: Home / Self Care | Payer: PPO | Source: Ambulatory Visit | Attending: Cardiovascular Disease | Admitting: Cardiovascular Disease

## 2021-11-10 VITALS — BP 124/52 | HR 75 | Ht 69.0 in | Wt 144.0 lb

## 2021-11-10 DIAGNOSIS — Z955 Presence of coronary angioplasty implant and graft: Secondary | ICD-10-CM | POA: Diagnosis not present

## 2021-11-10 DIAGNOSIS — I2111 ST elevation (STEMI) myocardial infarction involving right coronary artery: Secondary | ICD-10-CM | POA: Insufficient documentation

## 2021-11-10 LAB — GLUCOSE, CAPILLARY: Glucose-Capillary: 198 mg/dL — ABNORMAL HIGH (ref 70–99)

## 2021-11-10 NOTE — Progress Notes (Signed)
Cardiac Individual Treatment Plan  Patient Details  Name: Tommy Graham MRN: [id number] Date of Birth: 03-May-1940 Referring Provider:   Flowsheet Row CARDIAC REHAB PHASE II ORIENTATION from 11/10/2021 in [place]  Referring Provider Dr. Burt Knack       Initial Encounter Date:  Flowsheet Row CARDIAC REHAB PHASE II ORIENTATION from 11/10/2021 in [place]  Date 11/10/21       Visit Diagnosis: Status post coronary artery stent placement  ST elevation myocardial infarction involving right coronary artery (HCC)  Patient's Home Medications on Admission:  Current Outpatient Medications:    amiodarone (PACERONE) 200 MG tablet, Take 1 tablet (200 mg total) by mouth daily., Disp: 30 tablet, Rfl: 6   apixaban (ELIQUIS) 5 MG TABS tablet, Take 1 tablet (5 mg total) by mouth 2 (two) times daily., Disp: 60 tablet, Rfl: 5   atorvastatin (LIPITOR) 80 MG tablet, Take 1 tablet (80 mg total) by mouth daily., Disp: 30 tablet, Rfl: 5   clopidogrel (PLAVIX) 75 MG tablet, Take 1 tablet (75 mg total) by mouth daily., Disp: 30 tablet, Rfl: 5   dorzolamide (TRUSOPT) 2 % ophthalmic solution, Place 1 drop into both eyes in the morning., Disp: , Rfl:    DULoxetine (CYMBALTA) 30 MG capsule, Take 30 mg by mouth in the morning., Disp: , Rfl:    insulin glargine (LANTUS) 100 UNIT/ML injection, Inject 8-20 Units into the skin at bedtime., Disp: , Rfl:    latanoprost (XALATAN) 0.005 % ophthalmic solution, Place 1 drop into both eyes at bedtime., Disp: , Rfl:    metFORMIN (GLUCOPHAGE) 850 MG tablet, Take 850 mg by mouth in the morning and at bedtime. (Morning & afternoon), Disp: , Rfl:    pantoprazole (PROTONIX) 40 MG tablet, Take 1 tablet (40 mg total) by mouth 2 (two) times daily., Disp: 90 tablet, Rfl: 3   propranolol (INDERAL) 10 MG tablet, Take 10 mg by mouth 2 (two) times daily., Disp: , Rfl:    sertraline (ZOLOFT) 25 MG tablet, Take 25 mg by mouth in the morning.,  Disp: , Rfl:   Past Medical History: Past Medical History:  Diagnosis Date   Cancer ([place])    prostate   Diabetes mellitus without complication ([place])    Hypertension     Tobacco Use: Social History   Tobacco Use  Smoking Status Never  Smokeless Tobacco Never    Labs: Review Flowsheet  More data exists      Latest Ref Rng & Units 09/21/2021 09/22/2021 09/23/2021 09/24/2021 09/25/2021  Labs for [facility] Cardiac and Pulmonary Rehab  O2 Saturation % 52.8  53.8  65.7  68.7  69.8  64     Capillary Blood Glucose: Lab Results  Component Value Date   GLUCAP 198 (H) 11/10/2021   GLUCAP 342 (H) 09/25/2021   GLUCAP 142 (H) 09/25/2021   GLUCAP 135 (H) 09/25/2021   GLUCAP 188 (H) 09/24/2021    POCT Glucose     Row Name 11/10/21 0835             POCT Blood Glucose   Pre-Exercise 198 mg/dL                Exercise Target Goals: Exercise Program Goal: Individual exercise prescription set using results from initial 6 min walk test and THRR while considering  patient's activity barriers and safety.   Exercise Prescription Goal: Starting with aerobic activity 30 plus minutes a day, 3 days per week for initial exercise prescription. Provide home  exercise prescription and guidelines that participant acknowledges understanding prior to discharge.  Activity Barriers & Risk Stratification:  Activity Barriers & Cardiac Risk Stratification - 11/10/21 0858       Activity Barriers & Cardiac Risk Stratification   Activity Barriers Back Problems;Joint Problems;Deconditioning;Shortness of Breath    Cardiac Risk Stratification High             6 Minute Walk:  6 Minute Walk     Row Name 11/10/21 0900         6 Minute Walk   Phase Initial     Distance 1100 feet     Walk Time 6 minutes     # of Rest Breaks 0     MPH 2.08     METS 2.28     RPE 11     VO2 Peak 8     Symptoms No     Resting HR 75 bpm     Resting BP 124/52     Resting Oxygen Saturation  99 %     Exercise Oxygen  Saturation  during 6 min walk 98 %     Max Ex. HR 87 bpm     Max Ex. BP 138/58     2 Minute Post BP 120/54              Oxygen Initial Assessment:   Oxygen Re-Evaluation:   Oxygen Discharge (Final Oxygen Re-Evaluation):   Initial Exercise Prescription:  Initial Exercise Prescription - 11/10/21 0900       Date of Initial Exercise RX and Referring Provider   Date 11/10/21    Referring Provider Dr. Burt Knack    Expected Discharge Date 02/05/22      Treadmill   MPH 1    Grade 0    Minutes 17      Recumbant Elliptical   Level 1    RPM 60    Minutes 22      Prescription Details   Frequency (times per week) 3    Duration Progress to 30 minutes of continuous aerobic without signs/symptoms of physical distress      Intensity   THRR 40-80% of Max Heartrate 56-111    Ratings of Perceived Exertion 11-13      Resistance Training   Training Prescription Yes    Weight 3    Reps 10-15             Perform Capillary Blood Glucose checks as needed.  Exercise Prescription Changes:   Exercise Comments:   Exercise Goals and Review:   Exercise Goals     Row Name 11/10/21 0903             Exercise Goals   Increase Physical Activity Yes       Intervention Provide advice, education, support and counseling about physical activity/exercise needs.;Develop an individualized exercise prescription for aerobic and resistive training based on initial evaluation findings, risk stratification, comorbidities and participant's personal goals.       Expected Outcomes Short Term: Attend rehab on a regular basis to increase amount of physical activity.;Long Term: Add in home exercise to make exercise part of routine and to increase amount of physical activity.;Long Term: Exercising regularly at least 3-5 days a week.       Increase Strength and Stamina Yes       Intervention Provide advice, education, support and counseling about physical activity/exercise needs.;Develop an  individualized exercise prescription for aerobic and resistive training based on initial evaluation findings, risk  stratification, comorbidities and participant's personal goals.       Expected Outcomes Short Term: Increase workloads from initial exercise prescription for resistance, speed, and METs.;Short Term: Perform resistance training exercises routinely during rehab and add in resistance training at home;Long Term: Improve cardiorespiratory fitness, muscular endurance and strength as measured by increased METs and functional capacity (6MWT)       Able to understand and use rate of perceived exertion (RPE) scale Yes       Intervention Provide education and explanation on how to use RPE scale       Expected Outcomes Short Term: Able to use RPE daily in rehab to express subjective intensity level;Long Term:  Able to use RPE to guide intensity level when exercising independently       Knowledge and understanding of Target Heart Rate Range (THRR) Yes       Intervention Provide education and explanation of THRR including how the numbers were predicted and where they are located for reference       Expected Outcomes Short Term: Able to state/look up THRR;Short Term: Able to use daily as guideline for intensity in rehab;Long Term: Able to use THRR to govern intensity when exercising independently       Able to check pulse independently Yes       Intervention Provide education and demonstration on how to check pulse in carotid and radial arteries.;Review the importance of being able to check your own pulse for safety during independent exercise       Expected Outcomes Short Term: Able to explain why pulse checking is important during independent exercise;Long Term: Able to check pulse independently and accurately       Understanding of Exercise Prescription Yes       Intervention Provide education, explanation, and written materials on patient's individual exercise prescription       Expected Outcomes  Short Term: Able to explain program exercise prescription;Long Term: Able to explain home exercise prescription to exercise independently                Exercise Goals Re-Evaluation :    Discharge Exercise Prescription (Final Exercise Prescription Changes):   Nutrition:  Target Goals: Understanding of nutrition guidelines, daily intake of sodium '1500mg'$ , cholesterol '200mg'$ , calories 30% from fat and 7% or less from saturated fats, daily to have 5 or more servings of fruits and vegetables.  Biometrics:  Pre Biometrics - 11/10/21 0903       Pre Biometrics   Height '5\' 9"'$  (1.753 m)    Weight 143 lb 15.4 oz (65.3 kg)    Waist Circumference 34.5 inches    Hip Circumference 37 inches    Waist to Hip Ratio 0.93 %    BMI (Calculated) 21.25    Triceps Skinfold 14 mm    % Body Fat 22.8 %    Grip Strength 21.9 kg    Single Leg Stand 11.09 seconds              Nutrition Therapy Plan and Nutrition Goals:  Nutrition Therapy & Goals - 11/10/21 0901       Intervention Plan   Intervention Nutrition handout(s) given to patient.    Expected Outcomes Short Term Goal: Understand basic principles of dietary content, such as calories, fat, sodium, cholesterol and nutrients.             Nutrition Assessments:  Nutrition Assessments - 11/10/21 0834       MEDFICTS Scores   Pre Score 50  MEDIFICTS Score Key: ?70 Need to make dietary changes  40-70 Heart Healthy Diet ? 40 Therapeutic Level Cholesterol Diet   Picture Your Plate Scores: <81 Unhealthy dietary pattern with much room for improvement. 41-50 Dietary pattern unlikely to meet recommendations for good health and room for improvement. 51-60 More healthful dietary pattern, with some room for improvement.  >60 Healthy dietary pattern, although there may be some specific behaviors that could be improved.    Nutrition Goals Re-Evaluation:   Nutrition Goals Discharge (Final Nutrition Goals  Re-Evaluation):   Psychosocial: Target Goals: Acknowledge presence or absence of significant depression and/or stress, maximize coping skills, provide positive support system. Participant is able to verbalize types and ability to use techniques and skills needed for reducing stress and depression.  Initial Review & Psychosocial Screening:  Initial Psych Review & Screening - 11/10/21 0859       Initial Review   Current issues with None Identified      Family Dynamics   Good Support System? Yes    Comments His support system includes his wife and his children.      Barriers   Psychosocial barriers to participate in program There are no identifiable barriers or psychosocial needs.      Screening Interventions   Interventions Encouraged to exercise    Expected Outcomes Long Term goal: The participant improves quality of Life and PHQ9 Scores as seen by post scores and/or verbalization of changes;Short Term goal: Identification and review with participant of any Quality of Life or Depression concerns found by scoring the questionnaire.             Quality of Life Scores:  Quality of Life - 11/10/21 0904       Quality of Life   Select Quality of Life      Quality of Life Scores   Health/Function Pre 15.7 %    Socioeconomic Pre 26.79 %    Psych/Spiritual Pre 27.86 %    Family Pre 25.2 %    GLOBAL Pre 21.88 %            Scores of 19 and below usually indicate a poorer quality of life in these areas.  A difference of  2-3 points is a clinically meaningful difference.  A difference of 2-3 points in the total score of the Quality of Life Index has been associated with significant improvement in overall quality of life, self-image, physical symptoms, and general health in studies assessing change in quality of life.  PHQ-9: Review Flowsheet       11/10/2021  Depression screen PHQ 2/9  Decreased Interest 1  Down, Depressed, Hopeless 1  PHQ - 2 Score 2  Altered sleeping 0   Tired, decreased energy 3  Change in appetite 3  Feeling bad or failure about yourself  1  Trouble concentrating 3  Moving slowly or fidgety/restless 3  Suicidal thoughts 0  PHQ-9 Score 15  Difficult doing work/chores Somewhat difficult   Interpretation of Total Score  Total Score Depression Severity:  1-4 = Minimal depression, 5-9 = Mild depression, 10-14 = Moderate depression, 15-19 = Moderately severe depression, 20-27 = Severe depression   Psychosocial Evaluation and Intervention:  Psychosocial Evaluation - 11/10/21 0948       Psychosocial Evaluation & Interventions   Interventions Encouraged to exercise with the program and follow exercise prescription    Comments Pt has no barriers to participating in CR. He has no identfiable psychosocial issues. He scored a 15 on his PHQ-9,  and he relates this to complications since his coronary stent placement. He has been in and out of atrial fibrillation since his stent, and this has caused him significant SOB and fatigue. He is often woke up early in the moring by his atrial fibrillation. The medications that he is taking due to his heart disease are causing him to have a metallic taste in his mouth. He is unable to stomach food due to this, and he reports losing about 15 lbs since his stent. He has been drinking Ensure twice per day, but otherwise is not able to consume much. He is hopeful that cardiology will elect to stent the other side of his heart in a few months, so he can be taken off of some of his medications and that his atrial fibrillation may subside. He reportst that he has a good support system with his wife and his children. His goals while in the program are to gain weight and to decrease his SOB with exertion. He is not sure that he wants to do the whole program, but I have explained to him the benefits of participating in the full 12 weeks.    Expected Outcomes Pt will continue to have no identifiable psychosocial issues.     Continue Psychosocial Services  No Follow up required             Psychosocial Re-Evaluation:   Psychosocial Discharge (Final Psychosocial Re-Evaluation):   Vocational Rehabilitation: Provide vocational rehab assistance to qualifying candidates.   Vocational Rehab Evaluation & Intervention:  Vocational Rehab - 11/10/21 0835       Initial Vocational Rehab Evaluation & Intervention   Assessment shows need for Vocational Rehabilitation No      Vocational Rehab Re-Evaulation   Comments retired             Education: Education Goals: Education classes will be provided on a weekly basis, covering required topics. Participant will state understanding/return demonstration of topics presented.  Learning Barriers/Preferences:  Learning Barriers/Preferences - 11/10/21 9371       Learning Barriers/Preferences   Learning Barriers Hearing    Learning Preferences Skilled Demonstration;Written Material             Education Topics: Hypertension, Hypertension Reduction -Define heart disease and high blood pressure. Discus how high blood pressure affects the body and ways to reduce high blood pressure.   Exercise and Your Heart -Discuss why it is important to exercise, the FITT principles of exercise, normal and abnormal responses to exercise, and how to exercise safely.   Angina -Discuss definition of angina, causes of angina, treatment of angina, and how to decrease risk of having angina.   Cardiac Medications -Review what the following cardiac medications are used for, how they affect the body, and side effects that may occur when taking the medications.  Medications include Aspirin, Beta blockers, calcium channel blockers, ACE Inhibitors, angiotensin receptor blockers, diuretics, digoxin, and antihyperlipidemics.   Congestive Heart Failure -Discuss the definition of CHF, how to live with CHF, the signs and symptoms of CHF, and how keep track of weight and sodium  intake.   Heart Disease and Intimacy -Discus the effect sexual activity has on the heart, how changes occur during intimacy as we age, and safety during sexual activity.   Smoking Cessation / COPD -Discuss different methods to quit smoking, the health benefits of quitting smoking, and the definition of COPD.   Nutrition I: Fats -Discuss the types of cholesterol, what cholesterol does to the heart,  and how cholesterol levels can be controlled.   Nutrition II: Labels -Discuss the different components of food labels and how to read food label   Heart Parts/Heart Disease and PAD -Discuss the anatomy of the heart, the pathway of blood circulation through the heart, and these are affected by heart disease.   Stress I: Signs and Symptoms -Discuss the causes of stress, how stress may lead to anxiety and depression, and ways to limit stress.   Stress II: Relaxation -Discuss different types of relaxation techniques to limit stress.   Warning Signs of Stroke / TIA -Discuss definition of a stroke, what the signs and symptoms are of a stroke, and how to identify when someone is having stroke.   Knowledge Questionnaire Score:  Knowledge Questionnaire Score - 11/10/21 0833       Knowledge Questionnaire Score   Pre Score 22/24             Core Components/Risk Factors/Patient Goals at Admission:  Personal Goals and Risk Factors at Admission - 11/10/21 0905       Core Components/Risk Factors/Patient Goals on Admission    Weight Management Yes;Weight Gain    Intervention Weight Management: Provide education and appropriate resources to help participant work on and attain dietary goals.;Weight Management: Develop a combined nutrition and exercise program designed to reach desired caloric intake, while maintaining appropriate intake of nutrient and fiber, sodium and fats, and appropriate energy expenditure required for the weight goal.    Expected Outcomes Weight Gain: Understanding  of general recommendations for a high calorie, high protein meal plan that promotes weight gain by distributing calorie intake throughout the day with the consumption for 4-5 meals, snacks, and/or supplements    Improve shortness of breath with ADL's Yes    Intervention Provide education, individualized exercise plan and daily activity instruction to help decrease symptoms of SOB with activities of daily living.    Expected Outcomes Short Term: Improve cardiorespiratory fitness to achieve a reduction of symptoms when performing ADLs;Long Term: Be able to perform more ADLs without symptoms or delay the onset of symptoms    Heart Failure Yes    Intervention Provide a combined exercise and nutrition program that is supplemented with education, support and counseling about heart failure. Directed toward relieving symptoms such as shortness of breath, decreased exercise tolerance, and extremity edema.    Expected Outcomes Improve functional capacity of life;Short term: Attendance in program 2-3 days a week with increased exercise capacity. Reported lower sodium intake. Reported increased fruit and vegetable intake. Reports medication compliance.;Short term: Daily weights obtained and reported for increase. Utilizing diuretic protocols set by physician.;Long term: Adoption of self-care skills and reduction of barriers for early signs and symptoms recognition and intervention leading to self-care maintenance.    Hypertension Yes    Intervention Provide education on lifestyle modifcations including regular physical activity/exercise, weight management, moderate sodium restriction and increased consumption of fresh fruit, vegetables, and low fat dairy, alcohol moderation, and smoking cessation.;Monitor prescription use compliance.    Expected Outcomes Short Term: Continued assessment and intervention until BP is < 140/64m HG in hypertensive participants. < 130/843mHG in hypertensive participants with diabetes,  heart failure or chronic kidney disease.;Long Term: Maintenance of blood pressure at goal levels.    Lipids Yes    Intervention Provide education and support for participant on nutrition & aerobic/resistive exercise along with prescribed medications to achieve LDL '70mg'$ , HDL >'40mg'$ .    Expected Outcomes Short Term: Participant states understanding of desired cholesterol  values and is compliant with medications prescribed. Participant is following exercise prescription and nutrition guidelines.;Long Term: Cholesterol controlled with medications as prescribed, with individualized exercise RX and with personalized nutrition plan. Value goals: LDL < '70mg'$ , HDL > 40 mg.    Personal Goal Other Yes    Personal Goal Get stronger    Intervention Attend CR three days per week and begin a home exercise program.    Expected Outcomes Pt will meet their stated goals.             Core Components/Risk Factors/Patient Goals Review:    Core Components/Risk Factors/Patient Goals at Discharge (Final Review):    ITP Comments:   Comments: Patient arrived for 1st visit/orientation/education at 0800. Patient was referred to CR by Dr. Burt Knack due to status post coronary artery stent placement (Z95.5) and STEMI involving right coronary artery (I21.11). During orientation advised patient on arrival and appointment times what to wear, what to do before, during and after exercise. Reviewed attendance and class policy.  Pt is scheduled to return Cardiac Rehab on 11/16/2021 at 0930. Pt was advised to come to class 15 minutes before class starts.  Discussed RPE/Dpysnea scales. Patient participated in warm up stretches. Patient was able to complete 6 minute walk test.  Telemetry: NSR. Patient was measured for the equipment. Discussed equipment safety with patient. Took patient pre-anthropometric measurements. Patient finished visit at 0915.

## 2021-11-16 ENCOUNTER — Encounter (HOSPITAL_COMMUNITY)
Admission: RE | Admit: 2021-11-16 | Discharge: 2021-11-16 | Disposition: A | Discharge status: Home / Self Care | Payer: PPO | Source: Ambulatory Visit | Attending: Cardiovascular Disease | Admitting: Cardiovascular Disease

## 2021-11-16 DIAGNOSIS — Z955 Presence of coronary angioplasty implant and graft: Secondary | ICD-10-CM | POA: Diagnosis not present

## 2021-11-16 DIAGNOSIS — I2111 ST elevation (STEMI) myocardial infarction involving right coronary artery: Secondary | ICD-10-CM

## 2021-11-16 LAB — GLUCOSE, CAPILLARY: Glucose-Capillary: 182 mg/dL — ABNORMAL HIGH (ref 70–99)

## 2021-11-16 NOTE — Progress Notes (Signed)
Daily Session Note  Patient Details  Name: Tommy Graham MRN: [id number] Date of Birth: 20-May-1940 Referring Provider:   Flowsheet Row CARDIAC REHAB PHASE II ORIENTATION from 11/10/2021 in [place]  Referring Provider Dr. Burt Knack       Encounter Date: 11/16/2021  Check In:  Session Check In - 11/16/21 0930       Check-In   Supervising physician immediately available to respond to emergencies [facility] MD immediately available    Physician(s) Dr. Domenic Polite    Location [facility]    Staff Present Leana Roe, BS, Exercise Physiologist;[place] Sherrie George, MS, ACSM-CEP    Virtual Visit No    Medication changes reported     No    Fall or balance concerns reported    No    Tobacco Cessation No Change    Warm-up and Cool-down Performed as group-led instruction    Resistance Training Performed Yes    VAD Patient? No    PAD/SET Patient? No      Pain Assessment   Currently in Pain? No/denies    Multiple Pain Sites No             Capillary Blood Glucose: No results found for this or any previous visit (from the past 24 hour(s)).    Social History   Tobacco Use  Smoking Status Never  Smokeless Tobacco Never    Goals Met:  Independence with exercise equipment Exercise tolerated well No report of concerns or symptoms today Strength training completed today  Goals Unmet:  Not Applicable  Comments: check out 1030   Dr. Carlyle Dolly is Medical Director for [facility]

## 2021-11-18 ENCOUNTER — Encounter (HOSPITAL_COMMUNITY)
Admission: RE | Admit: 2021-11-18 | Discharge: 2021-11-18 | Disposition: A | Discharge status: Home / Self Care | Payer: PPO | Source: Ambulatory Visit | Attending: Cardiovascular Disease | Admitting: Cardiovascular Disease

## 2021-11-18 DIAGNOSIS — I2111 ST elevation (STEMI) myocardial infarction involving right coronary artery: Secondary | ICD-10-CM | POA: Diagnosis not present

## 2021-11-18 DIAGNOSIS — Z955 Presence of coronary angioplasty implant and graft: Secondary | ICD-10-CM | POA: Diagnosis not present

## 2021-11-18 NOTE — Progress Notes (Signed)
Daily Session Note  Patient Details  Name: Tommy Graham MRN: [id number] Date of Birth: 15-Nov-1940 Referring Provider:   Flowsheet Row CARDIAC REHAB PHASE II ORIENTATION from 11/10/2021 in [place]  Referring Provider Dr. Burt Knack       Encounter Date: 11/18/2021  Check In:  Session Check In - 11/18/21 0930       Check-In   Supervising physician immediately available to respond to emergencies [facility] MD immediately available    Physician(s) Dr. Domenic Polite    Location [facility]    Staff Present Leana Roe, BS, Exercise Physiologist;Arye Weyenberg Sherrie George, MS, ACSM-CEP    Virtual Visit No    Medication changes reported     No    Fall or balance concerns reported    No    Tobacco Cessation No Change    Warm-up and Cool-down Performed as group-led instruction    Resistance Training Performed Yes    VAD Patient? No    PAD/SET Patient? No      Pain Assessment   Currently in Pain? No/denies    Multiple Pain Sites No             Capillary Blood Glucose: No results found for this or any previous visit (from the past 24 hour(s)).    Social History   Tobacco Use  Smoking Status Never  Smokeless Tobacco Never    Goals Met:  Independence with exercise equipment Exercise tolerated well No report of concerns or symptoms today Strength training completed today  Goals Unmet:  Not Applicable  Comments: checkout time is 1030   Dr. Carlyle Dolly is Medical Director for [facility]

## 2021-11-20 ENCOUNTER — Encounter (HOSPITAL_COMMUNITY)
Admission: RE | Admit: 2021-11-20 | Discharge: 2021-11-20 | Disposition: A | Discharge status: Home / Self Care | Payer: PPO | Source: Ambulatory Visit | Attending: Cardiovascular Disease | Admitting: Cardiovascular Disease

## 2021-11-20 DIAGNOSIS — I2111 ST elevation (STEMI) myocardial infarction involving right coronary artery: Secondary | ICD-10-CM

## 2021-11-20 DIAGNOSIS — Z955 Presence of coronary angioplasty implant and graft: Secondary | ICD-10-CM | POA: Diagnosis not present

## 2021-11-20 NOTE — Progress Notes (Signed)
Daily Session Note  Patient Details  Name: Tommy Graham MRN: [id number] Date of Birth: January 04, 1941 Referring Provider:   Flowsheet Row CARDIAC REHAB PHASE II ORIENTATION from 11/10/2021 in [place]  Referring Provider Dr. Burt Knack       Encounter Date: 11/20/2021  Check In:  Session Check In - 11/20/21 0928       Check-In   Supervising physician immediately available to respond to emergencies [facility] MD immediately available    Physician(s) Dr. Harl Bowie    Location [facility]    Staff Present Leana Roe, BS, Exercise Physiologist;Daphyne Hassell Done, RN, Jennye Moccasin, RN, BSN;Zanobia Griebel, RN    Virtual Visit No    Medication changes reported     No    Fall or balance concerns reported    No    Tobacco Cessation No Change    Warm-up and Cool-down Performed as group-led instruction    Resistance Training Performed Yes    VAD Patient? No    PAD/SET Patient? No      Pain Assessment   Currently in Pain? No/denies    Multiple Pain Sites No             Capillary Blood Glucose: No results found for this or any previous visit (from the past 24 hour(s)).    Social History   Tobacco Use  Smoking Status Never  Smokeless Tobacco Never    Goals Met:  Independence with exercise equipment Exercise tolerated well No report of concerns or symptoms today Strength training completed today  Goals Unmet:  Not Applicable  Comments: check out @ 10:30am   Dr. Carlyle Dolly is Medical Director for [place]

## 2021-11-23 ENCOUNTER — Encounter (HOSPITAL_COMMUNITY)
Admission: RE | Admit: 2021-11-23 | Discharge: 2021-11-23 | Disposition: A | Discharge status: Home / Self Care | Payer: PPO | Source: Ambulatory Visit | Attending: Cardiovascular Disease | Admitting: Cardiovascular Disease

## 2021-11-23 VITALS — Wt 141.1 lb

## 2021-11-23 DIAGNOSIS — I2111 ST elevation (STEMI) myocardial infarction involving right coronary artery: Secondary | ICD-10-CM

## 2021-11-23 DIAGNOSIS — Z955 Presence of coronary angioplasty implant and graft: Secondary | ICD-10-CM | POA: Diagnosis not present

## 2021-11-23 NOTE — Progress Notes (Signed)
Daily Session Note  Patient Details  Name: Tommy Graham MRN: [id number] Date of Birth: 1940-07-16 Referring Provider:   Flowsheet Row CARDIAC REHAB PHASE II ORIENTATION from 11/10/2021 in [place]  Referring Provider Dr. Burt Knack       Encounter Date: 11/23/2021  Check In:  Session Check In - 11/23/21 0930       Check-In   Supervising physician immediately available to respond to emergencies [facility] MD immediately available    Physician(s) Dr. Dellia Cloud    Location [facility]    Staff Present Leana Roe, BS, Exercise Physiologist;Daphyne Hassell Done, RN, Jennye Moccasin, RN, BSN;Mellonie Guess, RN;Dalton Sherrie George, MS, ACSM-CEP    Virtual Visit No    Medication changes reported     No    Fall or balance concerns reported    No    Tobacco Cessation No Change    Warm-up and Cool-down Performed as group-led instruction    Resistance Training Performed Yes    VAD Patient? No    PAD/SET Patient? No      Pain Assessment   Currently in Pain? No/denies    Multiple Pain Sites No             Capillary Blood Glucose: No results found for this or any previous visit (from the past 24 hour(s)).    Social History   Tobacco Use  Smoking Status Never  Smokeless Tobacco Never    Goals Met:  Independence with exercise equipment Exercise tolerated well No report of concerns or symptoms today Strength training completed today  Goals Unmet:  Not Applicable  Comments: check out @ 10:30am   Dr. Carlyle Dolly is Medical Director for [facility]

## 2021-11-24 ENCOUNTER — Telehealth (HOSPITAL_COMMUNITY): Payer: Self-pay | Admitting: *Deleted

## 2021-11-24 NOTE — Telephone Encounter (Signed)
Pt left vm stating his weight was down and he has a lot of acid reflux and can't eat. I called the pt back for more information no answer/left vm for return call.

## 2021-11-25 ENCOUNTER — Encounter (HOSPITAL_COMMUNITY)
Admission: RE | Admit: 2021-11-25 | Discharge: 2021-11-25 | Disposition: A | Discharge status: Home / Self Care | Payer: PPO | Source: Ambulatory Visit | Attending: Cardiovascular Disease | Admitting: Cardiovascular Disease

## 2021-11-25 DIAGNOSIS — I2111 ST elevation (STEMI) myocardial infarction involving right coronary artery: Secondary | ICD-10-CM

## 2021-11-25 DIAGNOSIS — Z955 Presence of coronary angioplasty implant and graft: Secondary | ICD-10-CM

## 2021-11-25 NOTE — Progress Notes (Signed)
Cardiac Individual Treatment Plan  Patient Details  Name: Tommy Graham MRN: [id number] Date of Birth: [birth year] Referring Provider:   Flowsheet Row CARDIAC REHAB PHASE II ORIENTATION from 11/10/2021 in [facility]  Referring Provider Dr. Burt Knack       Initial Encounter Date:  Flowsheet Row CARDIAC REHAB PHASE II ORIENTATION from 11/10/2021 in [place]  Date 11/10/21       Visit Diagnosis: ST elevation myocardial infarction involving right coronary artery [facility])  Status post coronary artery stent placement  Patient's Home Medications on Admission:  Current Outpatient Medications:    amiodarone (PACERONE) 200 MG tablet, Take 1 tablet (200 mg total) by mouth daily., Disp: 30 tablet, Rfl: 6   apixaban (ELIQUIS) 5 MG TABS tablet, Take 1 tablet (5 mg total) by mouth 2 (two) times daily., Disp: 60 tablet, Rfl: 5   atorvastatin (LIPITOR) 80 MG tablet, Take 1 tablet (80 mg total) by mouth daily., Disp: 30 tablet, Rfl: 5   clopidogrel (PLAVIX) 75 MG tablet, Take 1 tablet (75 mg total) by mouth daily., Disp: 30 tablet, Rfl: 5   dorzolamide (TRUSOPT) 2 % ophthalmic solution, Place 1 drop into both eyes in the morning., Disp: , Rfl:    DULoxetine (CYMBALTA) 30 MG capsule, Take 30 mg by mouth in the morning., Disp: , Rfl:    insulin glargine (LANTUS) 100 UNIT/ML injection, Inject 8-20 Units into the skin at bedtime., Disp: , Rfl:    latanoprost (XALATAN) 0.005 % ophthalmic solution, Place 1 drop into both eyes at bedtime., Disp: , Rfl:    metFORMIN (GLUCOPHAGE) 850 MG tablet, Take 850 mg by mouth in the morning and at bedtime. (Morning & afternoon), Disp: , Rfl:    pantoprazole (PROTONIX) 40 MG tablet, Take 1 tablet (40 mg total) by mouth 2 (two) times daily., Disp: 90 tablet, Rfl: 3   propranolol (INDERAL) 10 MG tablet, Take 10 mg by mouth 2 (two) times daily., Disp: , Rfl:    sertraline (ZOLOFT) 25 MG tablet, Take 25 mg by mouth in the morning.,  Disp: , Rfl:   Past Medical History: Past Medical History:  Diagnosis Date   Cancer ([place])    prostate   Diabetes mellitus without complication ([place])    Hypertension     Tobacco Use: Social History   Tobacco Use  Smoking Status Never  Smokeless Tobacco Never    Labs: Review Flowsheet  More data exists      Latest Ref Rng & Units 09/21/2021 09/22/2021 09/23/2021 09/24/2021 09/25/2021  Labs for ITP Cardiac and Pulmonary Rehab  O2 Saturation % 52.8  53.8  65.7  68.7  69.8  64     Capillary Blood Glucose: Lab Results  Component Value Date   GLUCAP 182 (H) 11/16/2021   GLUCAP 198 (H) 11/10/2021   GLUCAP 342 (H) 09/25/2021   GLUCAP 142 (H) 09/25/2021   GLUCAP 135 (H) 09/25/2021    POCT Glucose     Row Name 11/10/21 0835             POCT Blood Glucose   Pre-Exercise 198 mg/dL                Exercise Target Goals: Exercise Program Goal: Individual exercise prescription set using results from initial 6 min walk test and THRR while considering  patient's activity barriers and safety.   Exercise Prescription Goal: Starting with aerobic activity 30 plus minutes a day, 3 days per week for initial exercise prescription. Provide home  exercise prescription and guidelines that participant acknowledges understanding prior to discharge.  Activity Barriers & Risk Stratification:  Activity Barriers & Cardiac Risk Stratification - 11/10/21 0858       Activity Barriers & Cardiac Risk Stratification   Activity Barriers Back Problems;Joint Problems;Deconditioning;Shortness of Breath    Cardiac Risk Stratification High             6 Minute Walk:  6 Minute Walk     Row Name 11/10/21 0900         6 Minute Walk   Phase Initial     Distance 1100 feet     Walk Time 6 minutes     # of Rest Breaks 0     MPH 2.08     METS 2.28     RPE 11     VO2 Peak 8     Symptoms No     Resting HR 75 bpm     Resting BP 124/52     Resting Oxygen Saturation  99 %     Exercise Oxygen  Saturation  during 6 min walk 98 %     Max Ex. HR 87 bpm     Max Ex. BP 138/58     2 Minute Post BP 120/54              Oxygen Initial Assessment:   Oxygen Re-Evaluation:   Oxygen Discharge (Final Oxygen Re-Evaluation):   Initial Exercise Prescription:  Initial Exercise Prescription - 11/10/21 0900       Date of Initial Exercise RX and Referring Provider   Date 11/10/21    Referring Provider Dr. Burt Knack    Expected Discharge Date 02/05/22      Treadmill   MPH 1    Grade 0    Minutes 17      Recumbant Elliptical   Level 1    RPM 60    Minutes 22      Prescription Details   Frequency (times per week) 3    Duration Progress to 30 minutes of continuous aerobic without signs/symptoms of physical distress      Intensity   THRR 40-80% of Max Heartrate 56-111    Ratings of Perceived Exertion 11-13      Resistance Training   Training Prescription Yes    Weight 3    Reps 10-15             Perform Capillary Blood Glucose checks as needed.  Exercise Prescription Changes:   Exercise Prescription Changes     Row Name 11/23/21 1300             Response to Exercise   Blood Pressure (Admit) 124/62       Blood Pressure (Exercise) 136/68       Blood Pressure (Exit) 130/60       Heart Rate (Admit) 72 bpm       Heart Rate (Exercise) 102 bpm       Heart Rate (Exit) 81 bpm       Rating of Perceived Exertion (Exercise) 13       Duration Continue with 30 min of aerobic exercise without signs/symptoms of physical distress.       Intensity THRR unchanged         Progression   Progression Continue to progress workloads to maintain intensity without signs/symptoms of physical distress.         Resistance Training   Training Prescription Yes       Weight  4       Reps 10-15       Time 10 Minutes         Treadmill   MPH 1.7       Grade 0       Minutes 17       METs 2.3         Recumbant Elliptical   Level 1       RPM 50       Minutes 22       METs  3.5                Exercise Comments:   Exercise Goals and Review:   Exercise Goals     Row Name 11/10/21 0903 11/23/21 1343           Exercise Goals   Increase Physical Activity Yes Yes      Intervention Provide advice, education, support and counseling about physical activity/exercise needs.;Develop an individualized exercise prescription for aerobic and resistive training based on initial evaluation findings, risk stratification, comorbidities and participant's personal goals. Provide advice, education, support and counseling about physical activity/exercise needs.;Develop an individualized exercise prescription for aerobic and resistive training based on initial evaluation findings, risk stratification, comorbidities and participant's personal goals.      Expected Outcomes Short Term: Attend rehab on a regular basis to increase amount of physical activity.;Long Term: Add in home exercise to make exercise part of routine and to increase amount of physical activity.;Long Term: Exercising regularly at least 3-5 days a week. Short Term: Attend rehab on a regular basis to increase amount of physical activity.;Long Term: Add in home exercise to make exercise part of routine and to increase amount of physical activity.;Long Term: Exercising regularly at least 3-5 days a week.      Increase Strength and Stamina Yes Yes      Intervention Provide advice, education, support and counseling about physical activity/exercise needs.;Develop an individualized exercise prescription for aerobic and resistive training based on initial evaluation findings, risk stratification, comorbidities and participant's personal goals. Provide advice, education, support and counseling about physical activity/exercise needs.;Develop an individualized exercise prescription for aerobic and resistive training based on initial evaluation findings, risk stratification, comorbidities and participant's personal goals.       Expected Outcomes Short Term: Increase workloads from initial exercise prescription for resistance, speed, and METs.;Short Term: Perform resistance training exercises routinely during rehab and add in resistance training at home;Long Term: Improve cardiorespiratory fitness, muscular endurance and strength as measured by increased METs and functional capacity (6MWT) Short Term: Increase workloads from initial exercise prescription for resistance, speed, and METs.;Short Term: Perform resistance training exercises routinely during rehab and add in resistance training at home;Long Term: Improve cardiorespiratory fitness, muscular endurance and strength as measured by increased METs and functional capacity (6MWT)      Able to understand and use rate of perceived exertion (RPE) scale Yes Yes      Intervention Provide education and explanation on how to use RPE scale Provide education and explanation on how to use RPE scale      Expected Outcomes Short Term: Able to use RPE daily in rehab to express subjective intensity level;Long Term:  Able to use RPE to guide intensity level when exercising independently Short Term: Able to use RPE daily in rehab to express subjective intensity level;Long Term:  Able to use RPE to guide intensity level when exercising independently      Knowledge and understanding of Target Heart  Rate Range (THRR) Yes Yes      Intervention Provide education and explanation of THRR including how the numbers were predicted and where they are located for reference Provide education and explanation of THRR including how the numbers were predicted and where they are located for reference      Expected Outcomes Short Term: Able to state/look up THRR;Short Term: Able to use daily as guideline for intensity in rehab;Long Term: Able to use THRR to govern intensity when exercising independently Short Term: Able to state/look up THRR;Short Term: Able to use daily as guideline for intensity in rehab;Long  Term: Able to use THRR to govern intensity when exercising independently      Able to check pulse independently Yes Yes      Intervention Provide education and demonstration on how to check pulse in carotid and radial arteries.;Review the importance of being able to check your own pulse for safety during independent exercise Provide education and demonstration on how to check pulse in carotid and radial arteries.;Review the importance of being able to check your own pulse for safety during independent exercise      Expected Outcomes Short Term: Able to explain why pulse checking is important during independent exercise;Long Term: Able to check pulse independently and accurately Short Term: Able to explain why pulse checking is important during independent exercise;Long Term: Able to check pulse independently and accurately      Understanding of Exercise Prescription Yes Yes      Intervention Provide education, explanation, and written materials on patient's individual exercise prescription Provide education, explanation, and written materials on patient's individual exercise prescription      Expected Outcomes Short Term: Able to explain program exercise prescription;Long Term: Able to explain home exercise prescription to exercise independently Short Term: Able to explain program exercise prescription;Long Term: Able to explain home exercise prescription to exercise independently               Exercise Goals Re-Evaluation :  Exercise Goals Re-Evaluation     Row Name 11/23/21 1343             Exercise Goal Re-Evaluation   Exercise Goals Review Increase Physical Activity;Increase Strength and Stamina;Able to understand and use rate of perceived exertion (RPE) scale;Knowledge and understanding of Target Heart Rate Range (THRR);Able to check pulse independently;Understanding of Exercise Prescription       Comments Pt has completed 6 sessions of cardiac rehab. He is going to progress slowly due  to be a little deconditioned. He is motived to be in class and has enjoyed coming. He is currently exercising at 3.5 METs on the ellp. Will continue to monitor and progress as able.       Expected Outcomes Through exercise at rehab and home, the patient will meet their stated goals.                 Discharge Exercise Prescription (Final Exercise Prescription Changes):  Exercise Prescription Changes - 11/23/21 1300       Response to Exercise   Blood Pressure (Admit) 124/62    Blood Pressure (Exercise) 136/68    Blood Pressure (Exit) 130/60    Heart Rate (Admit) 72 bpm    Heart Rate (Exercise) 102 bpm    Heart Rate (Exit) 81 bpm    Rating of Perceived Exertion (Exercise) 13    Duration Continue with 30 min of aerobic exercise without signs/symptoms of physical distress.    Intensity THRR unchanged  Progression   Progression Continue to progress workloads to maintain intensity without signs/symptoms of physical distress.      Resistance Training   Training Prescription Yes    Weight 4    Reps 10-15    Time 10 Minutes      Treadmill   MPH 1.7    Grade 0    Minutes 17    METs 2.3      Recumbant Elliptical   Level 1    RPM 50    Minutes 22    METs 3.5             Nutrition:  Target Goals: Understanding of nutrition guidelines, daily intake of sodium '1500mg'$ , cholesterol '200mg'$ , calories 30% from fat and 7% or less from saturated fats, daily to have 5 or more servings of fruits and vegetables.  Biometrics:  Pre Biometrics - 11/10/21 0903       Pre Biometrics   Height '5\' 9"'$  (1.753 m)    Weight 65.3 kg    Waist Circumference 34.5 inches    Hip Circumference 37 inches    Waist to Hip Ratio 0.93 %    BMI (Calculated) 21.25    Triceps Skinfold 14 mm    % Body Fat 22.8 %    Grip Strength 21.9 kg    Single Leg Stand 11.09 seconds              Nutrition Therapy Plan and Nutrition Goals:  Nutrition Therapy & Goals - 11/18/21 0919       Personal  Nutrition Goals   Comments Patient scored 50 on his diet assessment. We offer 2 educational sessions on heart healthy nutrition with handouts and assistance with RD referral if patient is interested.      Intervention Plan   Intervention Nutrition handout(s) given to patient.    Expected Outcomes Short Term Goal: Understand basic principles of dietary content, such as calories, fat, sodium, cholesterol and nutrients.             Nutrition Assessments:  Nutrition Assessments - 11/10/21 0834       MEDFICTS Scores   Pre Score 50            MEDIFICTS Score Key: ?70 Need to make dietary changes  40-70 Heart Healthy Diet ? 40 Therapeutic Level Cholesterol Diet   Picture Your Plate Scores: <26 Unhealthy dietary pattern with much room for improvement. 41-50 Dietary pattern unlikely to meet recommendations for good health and room for improvement. 51-60 More healthful dietary pattern, with some room for improvement.  >60 Healthy dietary pattern, although there may be some specific behaviors that could be improved.    Nutrition Goals Re-Evaluation:   Nutrition Goals Discharge (Final Nutrition Goals Re-Evaluation):   Psychosocial: Target Goals: Acknowledge presence or absence of significant depression and/or stress, maximize coping skills, provide positive support system. Participant is able to verbalize types and ability to use techniques and skills needed for reducing stress and depression.  Initial Review & Psychosocial Screening:  Initial Psych Review & Screening - 11/10/21 0859       Initial Review   Current issues with None Identified      Family Dynamics   Good Support System? Yes    Comments His support system includes his wife and his children.      Barriers   Psychosocial barriers to participate in program There are no identifiable barriers or psychosocial needs.      Screening Interventions   Interventions Encouraged to  exercise    Expected Outcomes Long  Term goal: The participant improves quality of Life and PHQ9 Scores as seen by post scores and/or verbalization of changes;Short Term goal: Identification and review with participant of any Quality of Life or Depression concerns found by scoring the questionnaire.             Quality of Life Scores:  Quality of Life - 11/10/21 0904       Quality of Life   Select Quality of Life      Quality of Life Scores   Health/Function Pre 15.7 %    Socioeconomic Pre 26.79 %    Psych/Spiritual Pre 27.86 %    Family Pre 25.2 %    GLOBAL Pre 21.88 %            Scores of 19 and below usually indicate a poorer quality of life in these areas.  A difference of  2-3 points is a clinically meaningful difference.  A difference of 2-3 points in the total score of the Quality of Life Index has been associated with significant improvement in overall quality of life, self-image, physical symptoms, and general health in studies assessing change in quality of life.  PHQ-9: Review Flowsheet       11/10/2021  Depression screen PHQ 2/9  Decreased Interest 1  Down, Depressed, Hopeless 1  PHQ - 2 Score 2  Altered sleeping 0  Tired, decreased energy 3  Change in appetite 3  Feeling bad or failure about yourself  1  Trouble concentrating 3  Moving slowly or fidgety/restless 3  Suicidal thoughts 0  PHQ-9 Score 15  Difficult doing work/chores Somewhat difficult   Interpretation of Total Score  Total Score Depression Severity:  1-4 = Minimal depression, 5-9 = Mild depression, 10-14 = Moderate depression, 15-19 = Moderately severe depression, 20-27 = Severe depression   Psychosocial Evaluation and Intervention:  Psychosocial Evaluation - 11/10/21 0948       Psychosocial Evaluation & Interventions   Interventions Encouraged to exercise with the program and follow exercise prescription    Comments Pt has no barriers to participating in CR. He has no identfiable psychosocial issues. He scored a 15 on  his PHQ-9, and he relates this to complications since his coronary stent placement. He has been in and out of atrial fibrillation since his stent, and this has caused him significant SOB and fatigue. He is often woke up early in the moring by his atrial fibrillation. The medications that he is taking due to his heart disease are causing him to have a metallic taste in his mouth. He is unable to stomach food due to this, and he reports losing about 15 lbs since his stent. He has been drinking Ensure twice per day, but otherwise is not able to consume much. He is hopeful that cardiology will elect to stent the other side of his heart in a few months, so he can be taken off of some of his medications and that his atrial fibrillation may subside. He reportst that he has a good support system with his wife and his children. His goals while in the program are to gain weight and to decrease his SOB with exertion. He is not sure that he wants to do the whole program, but I have explained to him the benefits of participating in the full 12 weeks.    Expected Outcomes Pt will continue to have no identifiable psychosocial issues.    Continue Psychosocial Services  No  Follow up required             Psychosocial Re-Evaluation:  Psychosocial Re-Evaluation     [place] Name 11/18/21 [phone]             Psychosocial Re-Evaluation   Current issues with None Identified       Comments Patient is new to the program. He has completed 1 session. He continues to have no psychosocial barriers identified. We will continue to monitor his progress.       Expected Outcomes Patient will continue to have no psychosocial barriers identified       Interventions Relaxation education;Stress management education;Encouraged to attend Cardiac Rehabilitation for the exercise       Continue Psychosocial Services  No Follow up required                Psychosocial Discharge (Final Psychosocial Re-Evaluation):  Psychosocial  Re-Evaluation - 11/18/21 0918       Psychosocial Re-Evaluation   Current issues with None Identified    Comments Patient is new to the program. He has completed 1 session. He continues to have no psychosocial barriers identified. We will continue to monitor his progress.    Expected Outcomes Patient will continue to have no psychosocial barriers identified    Interventions Relaxation education;Stress management education;Encouraged to attend Cardiac Rehabilitation for the exercise    Continue Psychosocial Services  No Follow up required             Vocational Rehabilitation: Provide vocational rehab assistance to qualifying candidates.   Vocational Rehab Evaluation & Intervention:  Vocational Rehab - 11/10/21 0835       Initial Vocational Rehab Evaluation & Intervention   Assessment shows need for Vocational Rehabilitation No      Vocational Rehab Re-Evaulation   Comments retired             Education: Education Goals: Education classes will be provided on a weekly basis, covering required topics. Participant will state understanding/return demonstration of topics presented.  Learning Barriers/Preferences:  Learning Barriers/Preferences - 11/10/21 4818       Learning Barriers/Preferences   Learning Barriers Hearing    Learning Preferences Skilled Demonstration;Written Material             Education Topics: Hypertension, Hypertension Reduction -Define heart disease and high blood pressure. Discus how high blood pressure affects the body and ways to reduce high blood pressure.   Exercise and Your Heart -Discuss why it is important to exercise, the FITT principles of exercise, normal and abnormal responses to exercise, and how to exercise safely.   Angina -Discuss definition of angina, causes of angina, treatment of angina, and how to decrease risk of having angina.   Cardiac Medications -Review what the following cardiac medications are used for, how  they affect the body, and side effects that may occur when taking the medications.  Medications include Aspirin, Beta blockers, calcium channel blockers, ACE Inhibitors, angiotensin receptor blockers, diuretics, digoxin, and antihyperlipidemics.   Congestive Heart Failure -Discuss the definition of CHF, how to live with CHF, the signs and symptoms of CHF, and how keep track of weight and sodium intake. Flowsheet Row CARDIAC REHAB PHASE II EXERCISE from 11/18/2021 in [place]  Date 11/18/21  Educator DF  Instruction Review Code 1- Verbalizes Understanding       Heart Disease and Intimacy -Discus the effect sexual activity has on the heart, how changes occur during intimacy as we age, and safety during sexual activity.  Smoking Cessation / COPD -Discuss different methods to quit smoking, the health benefits of quitting smoking, and the definition of COPD.   Nutrition I: Fats -Discuss the types of cholesterol, what cholesterol does to the heart, and how cholesterol levels can be controlled.   Nutrition II: Labels -Discuss the different components of food labels and how to read food label   Heart Parts/Heart Disease and PAD -Discuss the anatomy of the heart, the pathway of blood circulation through the heart, and these are affected by heart disease.   Stress I: Signs and Symptoms -Discuss the causes of stress, how stress may lead to anxiety and depression, and ways to limit stress.   Stress II: Relaxation -Discuss different types of relaxation techniques to limit stress.   Warning Signs of Stroke / TIA -Discuss definition of a stroke, what the signs and symptoms are of a stroke, and how to identify when someone is having stroke.   Knowledge Questionnaire Score:  Knowledge Questionnaire Score - 11/10/21 0833       Knowledge Questionnaire Score   Pre Score 22/24             Core Components/Risk Factors/Patient Goals at Admission:  Personal  Goals and Risk Factors at Admission - 11/10/21 0905       Core Components/Risk Factors/Patient Goals on Admission    Weight Management Yes;Weight Gain    Intervention Weight Management: Provide education and appropriate resources to help participant work on and attain dietary goals.;Weight Management: Develop a combined nutrition and exercise program designed to reach desired caloric intake, while maintaining appropriate intake of nutrient and fiber, sodium and fats, and appropriate energy expenditure required for the weight goal.    Expected Outcomes Weight Gain: Understanding of general recommendations for a high calorie, high protein meal plan that promotes weight gain by distributing calorie intake throughout the day with the consumption for 4-5 meals, snacks, and/or supplements    Improve shortness of breath with ADL's Yes    Intervention Provide education, individualized exercise plan and daily activity instruction to help decrease symptoms of SOB with activities of daily living.    Expected Outcomes Short Term: Improve cardiorespiratory fitness to achieve a reduction of symptoms when performing ADLs;Long Term: Be able to perform more ADLs without symptoms or delay the onset of symptoms    Heart Failure Yes    Intervention Provide a combined exercise and nutrition program that is supplemented with education, support and counseling about heart failure. Directed toward relieving symptoms such as shortness of breath, decreased exercise tolerance, and extremity edema.    Expected Outcomes Improve functional capacity of life;Short term: Attendance in program 2-3 days a week with increased exercise capacity. Reported lower sodium intake. Reported increased fruit and vegetable intake. Reports medication compliance.;Short term: Daily weights obtained and reported for increase. Utilizing diuretic protocols set by physician.;Long term: Adoption of self-care skills and reduction of barriers for early signs  and symptoms recognition and intervention leading to self-care maintenance.    Hypertension Yes    Intervention Provide education on lifestyle modifcations including regular physical activity/exercise, weight management, moderate sodium restriction and increased consumption of fresh fruit, vegetables, and low fat dairy, alcohol moderation, and smoking cessation.;Monitor prescription use compliance.    Expected Outcomes Short Term: Continued assessment and intervention until BP is < 140/42m HG in hypertensive participants. < 130/867mHG in hypertensive participants with diabetes, heart failure or chronic kidney disease.;Long Term: Maintenance of blood pressure at goal levels.    Lipids Yes  Intervention Provide education and support for participant on nutrition & aerobic/resistive exercise along with prescribed medications to achieve LDL '70mg'$ , HDL >'40mg'$ .    Expected Outcomes Short Term: Participant states understanding of desired cholesterol values and is compliant with medications prescribed. Participant is following exercise prescription and nutrition guidelines.;Long Term: Cholesterol controlled with medications as prescribed, with individualized exercise RX and with personalized nutrition plan. Value goals: LDL < '70mg'$ , HDL > 40 mg.    Personal Goal Other Yes    Personal Goal Get stronger    Intervention Attend CR three days per week and begin a home exercise program.    Expected Outcomes Pt will meet their stated goals.             Core Components/Risk Factors/Patient Goals Review:   Goals and Risk Factor Review     Row Name 11/18/21 0920             Core Components/Risk Factors/Patient Goals Review   Personal Goals Review Weight Management/Obesity;Improve shortness of breath with ADL's;Hypertension;Lipids;Heart Failure;Other       Review Patient was referred to Cr with STEMI/Stent placement. He has multiple risk factors for CAD and is participating in the program for risk  modification. He has completed 1 session. His current weight is 144.0. His personal goals for the program are to gain weight and decrease his SOB with exertion. We will continue to monitor his progress as he works towards meeting these goals.       Expected Outcomes Patient will complete the program meeting both personal and program goals.                Core Components/Risk Factors/Patient Goals at Discharge (Final Review):   Goals and Risk Factor Review - 11/18/21 0920       Core Components/Risk Factors/Patient Goals Review   Personal Goals Review Weight Management/Obesity;Improve shortness of breath with ADL's;Hypertension;Lipids;Heart Failure;Other    Review Patient was referred to Cr with STEMI/Stent placement. He has multiple risk factors for CAD and is participating in the program for risk modification. He has completed 1 session. His current weight is 144.0. His personal goals for the program are to gain weight and decrease his SOB with exertion. We will continue to monitor his progress as he works towards meeting these goals.    Expected Outcomes Patient will complete the program meeting both personal and program goals.             ITP Comments:   Comments: ITP REVIEW Pt is making expected progress toward Cardiac Rehab goals after completing 5 sessions. Recommend continued exercise, life style modification, education, and increased stamina and strength.

## 2021-11-25 NOTE — Progress Notes (Signed)
Daily Session Note  Patient Details  Name: Tommy Graham MRN: [id number] Date of Birth: Feb 18, 1940 Referring Provider:   Flowsheet Row CARDIAC REHAB PHASE II ORIENTATION from 11/10/2021 in [place]  Referring Provider Dr. Burt Knack       Encounter Date: 11/25/2021  Check In:  Session Check In - 11/25/21 0930       Check-In   Supervising physician immediately available to respond to emergencies [facility] MD immediately available    Physician(s) Dr. Dellia Cloud    Location [facility]    Staff Present Leana Roe, BS, Exercise Physiologist;Debra Wynetta Emery, RN, BSN    Virtual Visit No    Medication changes reported     No    Fall or balance concerns reported    No    Tobacco Cessation No Change    Warm-up and Cool-down Performed as group-led instruction    Resistance Training Performed Yes    VAD Patient? No    PAD/SET Patient? No      Pain Assessment   Currently in Pain? No/denies    Multiple Pain Sites No             Capillary Blood Glucose: No results found for this or any previous visit (from the past 24 hour(s)).    Social History   Tobacco Use  Smoking Status Never  Smokeless Tobacco Never    Goals Met:  Independence with exercise equipment Exercise tolerated well No report of concerns or symptoms today Strength training completed today  Goals Unmet:  Not Applicable  Comments: check out 1030   Dr. Carlyle Dolly is Medical Director for [place]

## 2021-11-27 ENCOUNTER — Encounter (HOSPITAL_COMMUNITY)
Admission: RE | Admit: 2021-11-27 | Discharge: 2021-11-27 | Disposition: A | Discharge status: Home / Self Care | Payer: PPO | Source: Ambulatory Visit | Attending: Cardiovascular Disease | Admitting: Cardiovascular Disease

## 2021-11-27 DIAGNOSIS — Z955 Presence of coronary angioplasty implant and graft: Secondary | ICD-10-CM | POA: Diagnosis not present

## 2021-11-27 NOTE — Progress Notes (Signed)
Daily Session Note  Patient Details  Name: Tommy Graham MRN: [id number] Date of Birth: 06/23/1940 Referring Provider:   Flowsheet Row CARDIAC REHAB PHASE II ORIENTATION from 11/10/2021 in [place] [facility]  Referring Provider Dr. Cooper       Encounter Date: 11/27/2021  Check In:  Session Check In - 11/27/21 0927       Check-In   Supervising physician immediately available to respond to emergencies [facility] MD immediately available    Physician(s) Dr. Mallipeddi    Location [facility]    Staff Present Dalton Fletcher MHA, MS, ACSM-CEP;Debra Johnson, RN, BSN;Daphyne Martin, RN, BSN    Virtual Visit No    Medication changes reported     No    Fall or balance concerns reported    No    Tobacco Cessation No Change    Warm-up and Cool-down Performed as group-led instruction    Resistance Training Performed Yes    VAD Patient? No    PAD/SET Patient? No      Pain Assessment   Currently in Pain? No/denies    Multiple Pain Sites No             Capillary Blood Glucose: No results found for this or any previous visit (from the past 24 hour(s)).    Social History   Tobacco Use  Smoking Status Never  Smokeless Tobacco Never    Goals Met:  Independence with exercise equipment Exercise tolerated well No report of concerns or symptoms today Strength training completed today  Goals Unmet:  Not Applicable  Comments: Check out 1030.   Dr. Jonathan Branch is Medical Director for [facility] 

## 2021-11-30 ENCOUNTER — Encounter (HOSPITAL_COMMUNITY)
Admission: RE | Admit: 2021-11-30 | Discharge: 2021-11-30 | Disposition: A | Discharge status: Home / Self Care | Payer: PPO | Source: Ambulatory Visit | Attending: Cardiovascular Disease | Admitting: Cardiovascular Disease

## 2021-11-30 DIAGNOSIS — I2111 ST elevation (STEMI) myocardial infarction involving right coronary artery: Secondary | ICD-10-CM

## 2021-11-30 DIAGNOSIS — Z955 Presence of coronary angioplasty implant and graft: Secondary | ICD-10-CM | POA: Diagnosis not present

## 2021-11-30 NOTE — Progress Notes (Signed)
Daily Session Note  Patient Details  Name: Tommy Graham MRN: [id number] Date of Birth: [birth year] Referring Provider:   Flowsheet Row CARDIAC REHAB PHASE II ORIENTATION from 11/10/2021 in [facility]  Referring Provider Dr. Burt Knack       Encounter Date: 11/30/2021  Check In:  Session Check In - 11/30/21 0927       Check-In   Supervising physician immediately available to respond to emergencies [facility] MD immediately available    Physician(s) Dr. Harl Bowie    Location [facility]    Staff Present Daphyne Hassell Done, RN, BSN;Merlin Ege Sherrie George, MS, ACSM-CEP;Leana Roe, BS, Exercise Physiologist    Virtual Visit No    Medication changes reported     No    Fall or balance concerns reported    No    Tobacco Cessation No Change    Warm-up and Cool-down Performed as group-led instruction    Resistance Training Performed Yes    VAD Patient? No    PAD/SET Patient? No      Pain Assessment   Currently in Pain? No/denies    Multiple Pain Sites No             Capillary Blood Glucose: No results found for this or any previous visit (from the past 24 hour(s)).    Social History   Tobacco Use  Smoking Status Never  Smokeless Tobacco Never    Goals Met:  Independence with exercise equipment Exercise tolerated well No report of concerns or symptoms today Strength training completed today  Goals Unmet:  Not Applicable  Comments: checkout time is 1030   Dr. Carlyle Dolly is Medical Director for [name]

## 2021-12-02 ENCOUNTER — Encounter (HOSPITAL_COMMUNITY)
Admission: RE | Admit: 2021-12-02 | Discharge: 2021-12-02 | Disposition: A | Discharge status: Home / Self Care | Payer: PPO | Source: Ambulatory Visit | Attending: Cardiovascular Disease | Admitting: Cardiovascular Disease

## 2021-12-02 DIAGNOSIS — Z955 Presence of coronary angioplasty implant and graft: Secondary | ICD-10-CM | POA: Diagnosis not present

## 2021-12-02 DIAGNOSIS — I2111 ST elevation (STEMI) myocardial infarction involving right coronary artery: Secondary | ICD-10-CM

## 2021-12-02 NOTE — Progress Notes (Signed)
Daily Session Note  Patient Details  Name: Tommy Graham MRN: [id number] Date of Birth: 15-Jun-1940 Referring Provider:   Flowsheet Row CARDIAC REHAB PHASE II ORIENTATION from 11/10/2021 in [place]  Referring Provider Dr. Burt Knack       Encounter Date: 12/02/2021  Check In:  Session Check In - 12/02/21 0930       Check-In   Supervising physician immediately available to respond to emergencies [facility] MD immediately available    Physician(s) Dr. Harl Bowie    Location [facility]    Staff Present Leana Roe, BS, Exercise Physiologist;[place]alton Sherrie George, MS, ACSM-CEP    Virtual Visit No    Medication changes reported     No    Fall or balance concerns reported    No    Tobacco Cessation No Change    Warm-up and Cool-down Performed as group-led instruction    Resistance Training Performed Yes    VAD Patient? No    PAD/SET Patient? No      Pain Assessment   Currently in Pain? No/denies    Multiple Pain Sites No             Capillary Blood Glucose: No results found for this or any previous visit (from the past 24 hour(s)).    Social History   Tobacco Use  Smoking Status Never  Smokeless Tobacco Never    Goals Met:  Independence with exercise equipment Exercise tolerated well No report of concerns or symptoms today Strength training completed today  Goals Unmet:  Not Applicable  Comments: check out 1030   Dr. Carlyle Dolly is Medical Director for [place]

## 2021-12-04 ENCOUNTER — Encounter (HOSPITAL_COMMUNITY)
Admission: RE | Admit: 2021-12-04 | Discharge: 2021-12-04 | Disposition: A | Discharge status: Home / Self Care | Payer: PPO | Source: Ambulatory Visit | Attending: Cardiovascular Disease | Admitting: Cardiovascular Disease

## 2021-12-04 DIAGNOSIS — Z955 Presence of coronary angioplasty implant and graft: Secondary | ICD-10-CM

## 2021-12-04 DIAGNOSIS — I2111 ST elevation (STEMI) myocardial infarction involving right coronary artery: Secondary | ICD-10-CM

## 2021-12-04 LAB — GLUCOSE, CAPILLARY: Glucose-Capillary: 243 mg/dL — ABNORMAL HIGH (ref 70–99)

## 2021-12-04 NOTE — Progress Notes (Signed)
Daily Session Note  Patient Details  Name: Tommy Graham MRN: [id number] Date of Birth: 1940-10-02 Referring Provider:   Flowsheet Row CARDIAC REHAB PHASE II ORIENTATION from 11/10/2021 in [facility]  Referring Provider Dr. Burt Knack       Encounter Date: 12/04/2021  Check In:  Session Check In - 12/04/21 0929       Check-In   Supervising physician immediately available to respond to emergencies [facility] MD immediately available    Physician(s) Dr. Harl Bowie    Location [facility]    Staff Present Leana Roe, BS, Exercise Physiologist;Debra Wynetta Emery, RN, Joanette Gula, RN, BSN    Virtual Visit No    Medication changes reported     No    Fall or balance concerns reported    No    Tobacco Cessation No Change    Warm-up and Cool-down Performed as group-led instruction    Resistance Training Performed Yes    VAD Patient? No    PAD/SET Patient? No      Pain Assessment   Currently in Pain? No/denies    Multiple Pain Sites No             Capillary Blood Glucose: No results found for this or any previous visit (from the past 24 hour(s)).    Social History   Tobacco Use  Smoking Status Never  Smokeless Tobacco Never    Goals Met:  Independence with exercise equipment Exercise tolerated well No report of concerns or symptoms today Strength training completed today  Goals Unmet:  Not Applicable  Comments: Checkout at 1030.   Dr. Carlyle Dolly is Medical Director for [facility]

## 2021-12-07 ENCOUNTER — Encounter (HOSPITAL_COMMUNITY)
Admission: RE | Admit: 2021-12-07 | Discharge: 2021-12-07 | Disposition: A | Discharge status: Home / Self Care | Payer: PPO | Source: Ambulatory Visit | Attending: Cardiovascular Disease | Admitting: Cardiovascular Disease

## 2021-12-07 VITALS — Wt 146.8 lb

## 2021-12-07 DIAGNOSIS — Z955 Presence of coronary angioplasty implant and graft: Secondary | ICD-10-CM

## 2021-12-07 DIAGNOSIS — I2111 ST elevation (STEMI) myocardial infarction involving right coronary artery: Secondary | ICD-10-CM

## 2021-12-07 NOTE — Progress Notes (Signed)
Daily Session Note  Patient Details  Name: Tommy Graham MRN: [id number] Date of Birth: 1941-01-13 Referring Provider:   Flowsheet Row CARDIAC REHAB PHASE II ORIENTATION from 11/10/2021 in [place]  Referring Provider Dr. Burt Knack       Encounter Date: 12/07/2021  Check In:  Session Check In - 12/07/21 0928       Check-In   Supervising physician immediately available to respond to emergencies [facility] MD immediately available    Physician(s) Dr Domenic Polite    Location [facility]    Staff Present Leana Roe, BS, Exercise Physiologist;Koltin Wehmeyer Hassell Done, RN, BSN;[place] Sherrie George, MS, ACSM-CEP    Virtual Visit No    Medication changes reported     No    Fall or balance concerns reported    No    Tobacco Cessation No Change    Warm-up and Cool-down Performed as group-led instruction    Resistance Training Performed Yes    VAD Patient? No    PAD/SET Patient? No      Pain Assessment   Currently in Pain? No/denies    Multiple Pain Sites No             Capillary Blood Glucose: No results found for this or any previous visit (from the past 24 hour(s)).    Social History   Tobacco Use  Smoking Status Never  Smokeless Tobacco Never    Goals Met:  Independence with exercise equipment Exercise tolerated well No report of concerns or symptoms today Strength training completed today  Goals Unmet:  Not Applicable  Comments: Checkout at 1030.   Dr. Carlyle Dolly is Medical Director for [facility]

## 2021-12-09 ENCOUNTER — Encounter (HOSPITAL_COMMUNITY): Payer: PPO

## 2021-12-09 DIAGNOSIS — J329 Chronic sinusitis, unspecified: Secondary | ICD-10-CM | POA: Diagnosis not present

## 2021-12-09 DIAGNOSIS — R059 Cough, unspecified: Secondary | ICD-10-CM | POA: Diagnosis not present

## 2021-12-09 DIAGNOSIS — Z6821 Body mass index (BMI) 21.0-21.9, adult: Secondary | ICD-10-CM | POA: Diagnosis not present

## 2021-12-11 ENCOUNTER — Encounter (HOSPITAL_COMMUNITY): Payer: PPO

## 2021-12-14 ENCOUNTER — Encounter (HOSPITAL_COMMUNITY)
Admission: RE | Admit: 2021-12-14 | Discharge: 2021-12-14 | Disposition: A | Discharge status: Home / Self Care | Payer: PPO | Source: Ambulatory Visit | Attending: Cardiovascular Disease | Admitting: Cardiovascular Disease

## 2021-12-14 DIAGNOSIS — Z955 Presence of coronary angioplasty implant and graft: Secondary | ICD-10-CM

## 2021-12-14 DIAGNOSIS — I2111 ST elevation (STEMI) myocardial infarction involving right coronary artery: Secondary | ICD-10-CM

## 2021-12-14 NOTE — Progress Notes (Signed)
Daily Session Note  Patient Details  Name: Tommy Graham MRN: [id number] Date of Birth: [birth year] Referring Provider:   Flowsheet Row CARDIAC REHAB PHASE II ORIENTATION from 11/10/2021 in [place]  Referring Provider Dr. Burt Knack       Encounter Date: 12/14/2021  Check In:  Session Check In - 12/14/21 0929       Check-In   Supervising physician immediately available to respond to emergencies [facility] MD immediately available    Physician(s) Dr Dellia Cloud    Location [facility]    Staff Present Leana Roe, BS, Exercise Physiologist;Lysandra Loughmiller Hassell Done, RN, BSN;Dalton Sherrie George, MS, ACSM-CEP    Virtual Visit No    Medication changes reported     No    Fall or balance concerns reported    No    Tobacco Cessation No Change    Warm-up and Cool-down Performed as group-led instruction    Resistance Training Performed Yes    VAD Patient? No    PAD/SET Patient? No      Pain Assessment   Currently in Pain? No/denies    Multiple Pain Sites No             Capillary Blood Glucose: No results found for this or any previous visit (from the past 24 hour(s)).    Social History   Tobacco Use  Smoking Status Never  Smokeless Tobacco Never    Goals Met:  Independence with exercise equipment Exercise tolerated well No report of concerns or symptoms today Strength training completed today  Goals Unmet:  Not Applicable  Comments: Checkout at 1030.   Dr. Carlyle Dolly is Medical Director for [facility]

## 2021-12-16 ENCOUNTER — Ambulatory Visit (HOSPITAL_COMMUNITY)
Admission: RE | Admit: 2021-12-16 | Discharge: 2021-12-16 | Disposition: A | Discharge status: Home / Self Care | Payer: PPO | Source: Ambulatory Visit | Attending: Internal Medicine | Admitting: Internal Medicine

## 2021-12-16 ENCOUNTER — Encounter (HOSPITAL_COMMUNITY): Payer: Self-pay | Admitting: Internal Medicine

## 2021-12-16 ENCOUNTER — Encounter (HOSPITAL_COMMUNITY)
Admission: RE | Admit: 2021-12-16 | Discharge: 2021-12-16 | Disposition: A | Discharge status: Home / Self Care | Payer: PPO | Source: Ambulatory Visit | Attending: Cardiovascular Disease | Admitting: Cardiovascular Disease

## 2021-12-16 VITALS — BP 130/78 | HR 62 | Wt 146.0 lb

## 2021-12-16 DIAGNOSIS — Z7902 Long term (current) use of antithrombotics/antiplatelets: Secondary | ICD-10-CM | POA: Insufficient documentation

## 2021-12-16 DIAGNOSIS — R627 Adult failure to thrive: Secondary | ICD-10-CM | POA: Diagnosis not present

## 2021-12-16 DIAGNOSIS — R630 Anorexia: Secondary | ICD-10-CM | POA: Insufficient documentation

## 2021-12-16 DIAGNOSIS — I110 Hypertensive heart disease with heart failure: Secondary | ICD-10-CM | POA: Diagnosis not present

## 2021-12-16 DIAGNOSIS — I5022 Chronic systolic (congestive) heart failure: Secondary | ICD-10-CM | POA: Diagnosis not present

## 2021-12-16 DIAGNOSIS — E785 Hyperlipidemia, unspecified: Secondary | ICD-10-CM | POA: Diagnosis not present

## 2021-12-16 DIAGNOSIS — Z955 Presence of coronary angioplasty implant and graft: Secondary | ICD-10-CM

## 2021-12-16 DIAGNOSIS — R1310 Dysphagia, unspecified: Secondary | ICD-10-CM | POA: Diagnosis not present

## 2021-12-16 DIAGNOSIS — Z7901 Long term (current) use of anticoagulants: Secondary | ICD-10-CM | POA: Diagnosis not present

## 2021-12-16 DIAGNOSIS — I480 Paroxysmal atrial fibrillation: Secondary | ICD-10-CM | POA: Diagnosis not present

## 2021-12-16 DIAGNOSIS — E875 Hyperkalemia: Secondary | ICD-10-CM | POA: Insufficient documentation

## 2021-12-16 DIAGNOSIS — I252 Old myocardial infarction: Secondary | ICD-10-CM | POA: Diagnosis not present

## 2021-12-16 DIAGNOSIS — K219 Gastro-esophageal reflux disease without esophagitis: Secondary | ICD-10-CM | POA: Diagnosis not present

## 2021-12-16 DIAGNOSIS — Z794 Long term (current) use of insulin: Secondary | ICD-10-CM | POA: Diagnosis not present

## 2021-12-16 DIAGNOSIS — Z7984 Long term (current) use of oral hypoglycemic drugs: Secondary | ICD-10-CM | POA: Diagnosis not present

## 2021-12-16 DIAGNOSIS — G4733 Obstructive sleep apnea (adult) (pediatric): Secondary | ICD-10-CM | POA: Diagnosis not present

## 2021-12-16 DIAGNOSIS — I2510 Atherosclerotic heart disease of native coronary artery without angina pectoris: Secondary | ICD-10-CM | POA: Diagnosis not present

## 2021-12-16 DIAGNOSIS — I4892 Unspecified atrial flutter: Secondary | ICD-10-CM | POA: Diagnosis not present

## 2021-12-16 DIAGNOSIS — Z79899 Other long term (current) drug therapy: Secondary | ICD-10-CM | POA: Insufficient documentation

## 2021-12-16 DIAGNOSIS — E1165 Type 2 diabetes mellitus with hyperglycemia: Secondary | ICD-10-CM | POA: Diagnosis not present

## 2021-12-16 DIAGNOSIS — I2111 ST elevation (STEMI) myocardial infarction involving right coronary artery: Secondary | ICD-10-CM

## 2021-12-16 LAB — CBC
HCT: 34.3 % — ABNORMAL LOW (ref 39.0–52.0)
Hemoglobin: 11.3 g/dL — ABNORMAL LOW (ref 13.0–17.0)
MCH: 31 pg (ref 26.0–34.0)
MCHC: 32.9 g/dL (ref 30.0–36.0)
MCV: 94 fL (ref 80.0–100.0)
Platelets: 336 10*3/uL (ref 150–400)
RBC: 3.65 MIL/uL — ABNORMAL LOW (ref 4.22–5.81)
RDW: 14.7 % (ref 11.5–15.5)
WBC: 7.4 10*3/uL (ref 4.0–10.5)
nRBC: 0 % (ref 0.0–0.2)

## 2021-12-16 LAB — COMPREHENSIVE METABOLIC PANEL
ALT: 72 U/L — ABNORMAL HIGH (ref 0–44)
AST: 57 U/L — ABNORMAL HIGH (ref 15–41)
Albumin: 3.3 g/dL — ABNORMAL LOW (ref 3.5–5.0)
Alkaline Phosphatase: 87 U/L (ref 38–126)
Anion gap: 10 (ref 5–15)
BUN: 19 mg/dL (ref 8–23)
CO2: 23 mmol/L (ref 22–32)
Calcium: 9.1 mg/dL (ref 8.9–10.3)
Chloride: 104 mmol/L (ref 98–111)
Creatinine, Ser: 1.27 mg/dL — ABNORMAL HIGH (ref 0.61–1.24)
GFR, Estimated: 57 mL/min — ABNORMAL LOW (ref >60)
Glucose, Bld: 146 mg/dL — ABNORMAL HIGH (ref 70–99)
Potassium: 4.8 mmol/L (ref 3.5–5.1)
Sodium: 137 mmol/L (ref 135–145)
Total Bilirubin: 0.7 mg/dL (ref 0.3–1.2)
Total Protein: 6.5 g/dL (ref 6.5–8.1)

## 2021-12-16 LAB — IRON AND TIBC
Iron: 35 ug/dL — ABNORMAL LOW (ref 45–182)
Saturation Ratios: 13 % — ABNORMAL LOW (ref 17.9–39.5)
TIBC: 272 ug/dL (ref 250–450)
UIBC: 237 ug/dL

## 2021-12-16 LAB — FERRITIN: Ferritin: 58 ng/mL (ref 24–336)

## 2021-12-16 LAB — BRAIN NATRIURETIC PEPTIDE: B Natriuretic Peptide: 2144.3 pg/mL — ABNORMAL HIGH (ref 0.0–100.0)

## 2021-12-16 MED ORDER — AMIODARONE HCL 200 MG PO TABS: 100.0000 mg | ORAL_TABLET | Freq: Every day | ORAL | 6 refills | Status: DC

## 2021-12-16 NOTE — Progress Notes (Signed)
Daily Session Note  Patient Details  Name: Tommy Graham MRN: [id number] Date of Birth: 1940/02/18 Referring Provider:   Flowsheet Row CARDIAC REHAB PHASE II ORIENTATION from 11/10/2021 in [place]  Referring Provider Dr. Burt Knack       Encounter Date: 12/16/2021  Check In:  Session Check In - 12/16/21 0930       Check-In   Supervising physician immediately available to respond to emergencies [facility] MD immediately available    Physician(s) Dr Dellia Cloud    Location [facility]    Staff Present Leana Roe, BS, Exercise Physiologist;Dalton Sherrie George, MS, ACSM-CEP;Melven Sartorius BSN, RN    Virtual Visit No    Medication changes reported     No    Fall or balance concerns reported    No    Tobacco Cessation No Change    Warm-up and Cool-down Performed as group-led Higher education careers adviser Performed Yes    VAD Patient? No    PAD/SET Patient? No      Pain Assessment   Currently in Pain? No/denies    Multiple Pain Sites No             Capillary Blood Glucose: No results found for this or any previous visit (from the past 24 hour(s)).    Social History   Tobacco Use  Smoking Status Never  Smokeless Tobacco Never    Goals Met:  Independence with exercise equipment Exercise tolerated well No report of concerns or symptoms today Strength training completed today  Goals Unmet:  Not Applicable  Comments: check out at 10:30   Dr. Carlyle Dolly is Medical Director for [place]

## 2021-12-16 NOTE — Patient Instructions (Signed)
DECREASE Amiodarone to 100 mg daily   Lab work done today we will call you with any abnormal results  Barium swallow has been ordered. They will call to schedule the test  Your physician recommends that you schedule a follow-up appointment in: 4 months  If you have any questions or concerns before your next appointment please send [place] a message through [place] or call our office at [phone].    TO LEAVE A MESSAGE FOR THE NURSE SELECT OPTION 2, PLEASE LEAVE A MESSAGE INCLUDING: YOUR NAME DATE OF BIRTH CALL BACK NUMBER REASON FOR CALL**this is important as we prioritize the call backs  YOU WILL RECEIVE A CALL BACK THE SAME DAY AS LONG AS YOU CALL BEFORE 4:00 PM  At the [facility], you and your health needs are our priority. As part of our continuing mission to provide you with exceptional heart care, we have created designated Provider Care Teams. These Care Teams include your primary Cardiologist (physician) and Advanced Practice Providers (APPs- Physician Assistants and Nurse Practitioners) who all work together to provide you with the care you need, when you need it.   You may see any of the following providers on your designated Care Team at your next follow up: Dr Glori Bickers Dr Loralie Champagne Dr. Roxana Hires, NP Lyda Jester, [place] [facility] [place], [place] Forestine Na, NP Audry Riles, PharmD   Please be sure to bring in all your medications bottles to every appointment.   Do the following things EVERYDAY: Weigh yourself in the morning before breakfast. Write it down and keep it in a log. Take your medicines as prescribed Eat low salt foods--Limit salt (sodium) to 2000 mg per day.  Stay as active as you can everyday Limit all fluids for the day to less than 2 liters

## 2021-12-16 NOTE — Progress Notes (Signed)
ADVANCED HF CLINIC NOTE   PCP: Primary HF Cardiologist: Dr Haroldine Laws Sleep Cardiology: Dr Radford Pax   HPI: 81 y.o. male with hx uncontrolled DM II, HTN, HLD. Admitted 09/17/21 with late presenting inferolateral STEMI with RV involvement c/b cardiogenic shock in setting of RV failure. Found to have proximal occlusion of RCA and 75% d LM/ostial LAD. Underwent emergent PCI/DES to RCA. Staged PCI LM/LAD to be planned at a later date. EF ~ 45%.    Hemodynamics c/w low-output RV failure. Initially on Milrinone for inotrope support, later switched to DBA/NE d/t persistent low-output. Developed AKI and shock liver. Renal function and LFTs were followed closely and improved with hemodynamic support. He was eventually weaned off inotropes and maintained stable CO-OX. GDMT titrated as tolerated.Course c/b paroxysmal AF. Loaded with IV amio, in and out of a fib. Rate controlled.  Amio loading to be continued after discharge.  Zio 10/05/21 io 29 % A flutter.   He was seen in the HF clinic 10/05/21 and was feeling bad due to fatigue./ K 6.3 Creatinine 1.5. Dig , spiro, and losartan stopped. Given lokelma.    Today he returns HF follow up with his family. Going to CR at [facility] 3x/week. Doing well from cardiac standpoint but overall feeling terrible. No CP, orthopnea or PND. Has mild LE edema and wearing TED hose. Has metallic taste in his mouth. Struggling with food getting stuck frequently. Frequent nausea and no appetite. Recent black stools. Has lost 3-4 pounds. No palpitations. Saw GI in September but felt too early after his MI to scope.    ROS: All systems negative except as listed in HPI, PMH and Problem List.  SH:  Social History   Socioeconomic History   Marital status: Married    Spouse name: Not on file   Number of children: Not on file   Years of education: Not on file   Highest education level: Not on file  Occupational History   Not on file  Tobacco Use   Smoking status: Never   Smokeless  tobacco: Never  Vaping Use   Vaping Use: Never used  Substance and Sexual Activity   Alcohol use: Never   Drug use: Never   Sexual activity: Not on file  Other Topics Concern   Not on file  Social History Narrative   Not on file   Social Determinants of Health   Financial Resource Strain: Not on file  Food Insecurity: No Food Insecurity (09/23/2021)   Hunger Vital Sign    Worried About Running Out of Food in the Last Year: Never true    Ran Out of Food in the Last Year: Never true  Transportation Needs: No Transportation Needs (09/23/2021)   PRAPARE - Hydrologist (Medical): No    Lack of Transportation (Non-Medical): No  Physical Activity: Not on file  Stress: Not on file  Social Connections: Not on file  Intimate Partner Violence: Not At Risk (09/23/2021)   Humiliation, Afraid, Rape, and Kick questionnaire    Fear of Current or Ex-Partner: No    Emotionally Abused: No    Physically Abused: No    Sexually Abused: No    FH: No family history on file.  Past Medical History:  Diagnosis Date   Cancer [facility])    prostate   Diabetes mellitus without complication ([place])    Hypertension     Current Outpatient Medications  Medication Sig Dispense Refill   amiodarone (PACERONE) 200 MG tablet Take 1  tablet (200 mg total) by mouth daily. 30 tablet 6   amoxicillin (AMOXIL) 875 MG tablet Take 875 mg by mouth 2 (two) times daily.     apixaban (ELIQUIS) 5 MG TABS tablet Take 1 tablet (5 mg total) by mouth 2 (two) times daily. 60 tablet 5   atorvastatin (LIPITOR) 80 MG tablet Take 1 tablet (80 mg total) by mouth daily. 30 tablet 5   clopidogrel (PLAVIX) 75 MG tablet Take 1 tablet (75 mg total) by mouth daily. 30 tablet 5   dorzolamide (TRUSOPT) 2 % ophthalmic solution Place 1 drop into both eyes in the morning.     DULoxetine (CYMBALTA) 30 MG capsule Take 30 mg by mouth in the morning.     insulin glargine (LANTUS) 100 UNIT/ML injection Inject 8-20 Units into  the skin at bedtime.     latanoprost (XALATAN) 0.005 % ophthalmic solution Place 1 drop into both eyes at bedtime.     metFORMIN (GLUCOPHAGE) 850 MG tablet Take 850 mg by mouth in the morning and at bedtime. (Morning & afternoon)     ondansetron (ZOFRAN) 8 MG tablet Take 8 mg by mouth daily in the afternoon.     pantoprazole (PROTONIX) 40 MG tablet Take 1 tablet (40 mg total) by mouth 2 (two) times daily. 90 tablet 3   propranolol (INDERAL) 10 MG tablet Take 10 mg by mouth 2 (two) times daily.     sertraline (ZOLOFT) 25 MG tablet Take 25 mg by mouth in the morning.     No current facility-administered medications for this encounter.    Vitals:   12/16/21 1525  BP: 130/78  Pulse: 62  SpO2: 99%  Weight: 66.2 kg (146 lb)   Wt Readings from Last 3 Encounters:  12/16/21 66.2 kg (146 lb)  12/07/21 66.6 kg (146 lb 13.2 oz)  11/23/21 64 kg (141 lb 1.5 oz)    PHYSICAL EXAM: General:  Well appearing. No resp difficulty HEENT: normal Neck: supple. no JVD. Carotids 2+ bilat; no bruits. No lymphadenopathy or thryomegaly appreciated. Cor: PMI nondisplaced. Regular rate & rhythm. No rubs, gallops or murmurs. Lungs: clear Abdomen: soft, nontender, nondistended. No hepatosplenomegaly. No bruits or masses. Good bowel sounds. Extremities: no cyanosis, clubbing, rash, edema Neuro: alert & orientedx3, cranial nerves grossly intact. moves all 4 extremities w/o difficulty. Affect pleasant   EKG: SR 67 bpm   ASSESSMENT & PLAN:   1. Chronic systolic CHF/iCM - Cardiogenic shock 08/23 due primarily to RV infarct in setting of late-presenting RCA STEMI. Required inotrope support with DBA. - Echo 08/23 EF 30-35% RV moderately reduced trivial MR - NYHA III but tolerating CR.  - Volume ok - On propanolol for hx tremor - No SGLT2i for now with significant weight loss. Consider SGLT2i next visit.  - No Spiro/entresto with K >6.  - Recheck labs today  2. 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 - No current angina - For now will delay PCI LM/LAD given significant non-cardiac issues. LM disease is not critical.  3. Failure to thrive/anorexia/dysphagia - severe. Concern for stricture/ No improvement with PPI - has seen GI and suggested EGD but waiting for timing after MI - he is stable enough for EGD and ok to stop Eliquis but ideally would wait 6 months to stop Plavix - will start with UGI - f/u with GI in December - Can proceed with  EGD on plavix for diagnostic purposes only. If intervention need for critical issue can likely stop Plavix for a few days - Decrease amio to 100 daily - Continue Ensure supplements  4. DM2, poorly controlled - HGBa1c 9.5% - On metformin and insulin - Planning to establish with Endocrine  5. PAF/AFL - in and out since recent admit, - continue amiodarone '200mg'$  daily - 3 day zio monitor (9/23). A flutter burden 29%. - In NSR today  - continue Eliquis 5 BID  6. Hyperkalemia  K has been running > 5 Check BMET  7. OSA Unable to wear CPAP. In the past he saw Dr Radford Pax.    Glori Bickers MD 3:50 PM

## 2021-12-17 DIAGNOSIS — N5231 Erectile dysfunction following radical prostatectomy: Secondary | ICD-10-CM | POA: Diagnosis not present

## 2021-12-17 DIAGNOSIS — C61 Malignant neoplasm of prostate: Secondary | ICD-10-CM | POA: Diagnosis not present

## 2021-12-18 ENCOUNTER — Encounter (HOSPITAL_COMMUNITY): Payer: PPO

## 2021-12-21 ENCOUNTER — Encounter (HOSPITAL_COMMUNITY): Payer: PPO

## 2021-12-21 ENCOUNTER — Ambulatory Visit (INDEPENDENT_AMBULATORY_CARE_PROVIDER_SITE_OTHER): Payer: PPO | Admitting: Gastroenterology

## 2021-12-22 ENCOUNTER — Other Ambulatory Visit (HOSPITAL_COMMUNITY): Payer: Self-pay | Admitting: *Deleted

## 2021-12-22 DIAGNOSIS — R627 Adult failure to thrive: Secondary | ICD-10-CM

## 2021-12-22 DIAGNOSIS — R1310 Dysphagia, unspecified: Secondary | ICD-10-CM

## 2021-12-22 DIAGNOSIS — K219 Gastro-esophageal reflux disease without esophagitis: Secondary | ICD-10-CM

## 2021-12-23 ENCOUNTER — Encounter (HOSPITAL_COMMUNITY)
Admission: RE | Admit: 2021-12-23 | Discharge: 2021-12-23 | Disposition: A | Discharge status: Home / Self Care | Payer: PPO | Source: Ambulatory Visit | Attending: Cardiovascular Disease | Admitting: Cardiovascular Disease

## 2021-12-23 DIAGNOSIS — I2111 ST elevation (STEMI) myocardial infarction involving right coronary artery: Secondary | ICD-10-CM | POA: Insufficient documentation

## 2021-12-23 DIAGNOSIS — Z955 Presence of coronary angioplasty implant and graft: Secondary | ICD-10-CM | POA: Diagnosis not present

## 2021-12-23 NOTE — Progress Notes (Signed)
Cardiac Individual Treatment Plan  Patient Details  Name: Tommy Graham MRN: [id number] Date of Birth: Dec 25, 1940 Referring Provider:   Flowsheet Row CARDIAC REHAB PHASE II ORIENTATION from 11/10/2021 in [place]  Referring Provider Dr. Burt Knack       Initial Encounter Date:  Flowsheet Row CARDIAC REHAB PHASE II ORIENTATION from 11/10/2021 in [place]  Date 11/10/21       Visit Diagnosis: ST elevation myocardial infarction involving right coronary artery [facility])  Status post coronary artery stent placement  Patient's Home Medications on Admission:  Current Outpatient Medications:    amiodarone (PACERONE) 200 MG tablet, Take 0.5 tablets (100 mg total) by mouth daily., Disp: 30 tablet, Rfl: 6   amoxicillin (AMOXIL) 875 MG tablet, Take 875 mg by mouth 2 (two) times daily., Disp: , Rfl:    apixaban (ELIQUIS) 5 MG TABS tablet, Take 1 tablet (5 mg total) by mouth 2 (two) times daily., Disp: 60 tablet, Rfl: 5   atorvastatin (LIPITOR) 80 MG tablet, Take 1 tablet (80 mg total) by mouth daily., Disp: 30 tablet, Rfl: 5   clopidogrel (PLAVIX) 75 MG tablet, Take 1 tablet (75 mg total) by mouth daily., Disp: 30 tablet, Rfl: 5   dorzolamide (TRUSOPT) 2 % ophthalmic solution, Place 1 drop into both eyes in the morning., Disp: , Rfl:    DULoxetine (CYMBALTA) 30 MG capsule, Take 30 mg by mouth in the morning., Disp: , Rfl:    insulin glargine (LANTUS) 100 UNIT/ML injection, Inject 8-20 Units into the skin at bedtime., Disp: , Rfl:    latanoprost (XALATAN) 0.005 % ophthalmic solution, Place 1 drop into both eyes at bedtime., Disp: , Rfl:    metFORMIN (GLUCOPHAGE) 850 MG tablet, Take 850 mg by mouth in the morning and at bedtime. (Morning & afternoon), Disp: , Rfl:    ondansetron (ZOFRAN) 8 MG tablet, Take 8 mg by mouth daily in the afternoon., Disp: , Rfl:    pantoprazole (PROTONIX) 40 MG tablet, Take 1 tablet (40 mg total) by mouth 2 (two) times  daily., Disp: 90 tablet, Rfl: 3   propranolol (INDERAL) 10 MG tablet, Take 10 mg by mouth 2 (two) times daily., Disp: , Rfl:    sertraline (ZOLOFT) 25 MG tablet, Take 25 mg by mouth in the morning., Disp: , Rfl:   Past Medical History: Past Medical History:  Diagnosis Date   Cancer ([place])    prostate   Diabetes mellitus without complication ([place])    Hypertension     Tobacco Use: Social History   Tobacco Use  Smoking Status Never  Smokeless Tobacco Never    Labs: Review Flowsheet  More data exists      Latest Ref Rng & Units 09/21/2021 09/22/2021 09/23/2021 09/24/2021 09/25/2021  Labs for ITP Cardiac and Pulmonary Rehab  O2 Saturation % 52.8  53.8  65.7  68.7  69.8  64     Capillary Blood Glucose: Lab Results  Component Value Date   GLUCAP 243 (H) 12/04/2021   GLUCAP 182 (H) 11/16/2021   GLUCAP 198 (H) 11/10/2021   GLUCAP 342 (H) 09/25/2021   GLUCAP 142 (H) 09/25/2021    POCT Glucose     Row Name 11/10/21 0835             POCT Blood Glucose   Pre-Exercise 198 mg/dL                Exercise Target Goals: Exercise Program Goal: Individual exercise prescription set using results  from initial 6 min walk test and THRR while considering  patient's activity barriers and safety.   Exercise Prescription Goal: Starting with aerobic activity 30 plus minutes a day, 3 days per week for initial exercise prescription. Provide home exercise prescription and guidelines that participant acknowledges understanding prior to discharge.  Activity Barriers & Risk Stratification:  Activity Barriers & Cardiac Risk Stratification - 11/10/21 0858       Activity Barriers & Cardiac Risk Stratification   Activity Barriers Back Problems;Joint Problems;Deconditioning;Shortness of Breath    Cardiac Risk Stratification High             6 Minute Walk:  6 Minute Walk     Row Name 11/10/21 0900         6 Minute Walk   Phase Initial     Distance 1100 feet     Walk Time 6 minutes      # of Rest Breaks 0     MPH 2.08     METS 2.28     RPE 11     VO2 Peak 8     Symptoms No     Resting HR 75 bpm     Resting BP 124/52     Resting Oxygen Saturation  99 %     Exercise Oxygen Saturation  during 6 min walk 98 %     Max Ex. HR 87 bpm     Max Ex. BP 138/58     2 Minute Post BP 120/54              Oxygen Initial Assessment:   Oxygen Re-Evaluation:   Oxygen Discharge (Final Oxygen Re-Evaluation):   Initial Exercise Prescription:  Initial Exercise Prescription - 11/10/21 0900       Date of Initial Exercise RX and Referring Provider   Date 11/10/21    Referring Provider Dr. Burt Knack    Expected Discharge Date 02/05/22      Treadmill   MPH 1    Grade 0    Minutes 17      Recumbant Elliptical   Level 1    RPM 60    Minutes 22      Prescription Details   Frequency (times per week) 3    Duration Progress to 30 minutes of continuous aerobic without signs/symptoms of physical distress      Intensity   THRR 40-80% of Max Heartrate 56-111    Ratings of Perceived Exertion 11-13      Resistance Training   Training Prescription Yes    Weight 3    Reps 10-15             Perform Capillary Blood Glucose checks as needed.  Exercise Prescription Changes:   Exercise Prescription Changes     Row Name 11/23/21 1300 12/07/21 1000 12/16/21 1523         Response to Exercise   Blood Pressure (Admit) 124/62 128/72 130/68     Blood Pressure (Exercise) 136/68 140/68 112/70     Blood Pressure (Exit) 130/60 116/72 112/64     Heart Rate (Admit) 72 bpm 78 bpm 73 bpm     Heart Rate (Exercise) 102 bpm 95 bpm 98 bpm     Heart Rate (Exit) 81 bpm 82 bpm 81 bpm     Rating of Perceived Exertion (Exercise) '13 12 11     '$ Duration Continue with 30 min of aerobic exercise without signs/symptoms of physical distress. Continue with 30 min of aerobic exercise without signs/symptoms  of physical distress. Continue with 30 min of aerobic exercise without signs/symptoms of  physical distress.     Intensity THRR unchanged THRR unchanged THRR unchanged       Progression   Progression Continue to progress workloads to maintain intensity without signs/symptoms of physical distress. Continue to progress workloads to maintain intensity without signs/symptoms of physical distress. Continue to progress workloads to maintain intensity without signs/symptoms of physical distress.       Resistance Training   Training Prescription Yes Yes Yes     Weight '4 4 3     '$ Reps 10-15 10-15 10-15     Time 10 Minutes 10 Minutes 10 Minutes       Treadmill   MPH 1.7 1.6 1.6     Grade 0 0 0     Minutes '17 17 17     '$ METs 2.3 2.23 2.23       Recumbant Elliptical   Level '1 1 1     '$ RPM 50 45 50     Minutes '22 22 22     '$ METs 3.5 4 3.7              Exercise Comments:   Exercise Goals and Review:   Exercise Goals     Row Name 11/10/21 5974 11/23/21 1343 12/21/21 1524         Exercise Goals   Increase Physical Activity Yes Yes Yes     Intervention Provide advice, education, support and counseling about physical activity/exercise needs.;Develop an individualized exercise prescription for aerobic and resistive training based on initial evaluation findings, risk stratification, comorbidities and participant's personal goals. Provide advice, education, support and counseling about physical activity/exercise needs.;Develop an individualized exercise prescription for aerobic and resistive training based on initial evaluation findings, risk stratification, comorbidities and participant's personal goals. Provide advice, education, support and counseling about physical activity/exercise needs.;Develop an individualized exercise prescription for aerobic and resistive training based on initial evaluation findings, risk stratification, comorbidities and participant's personal goals.     Expected Outcomes Short Term: Attend rehab on a regular basis to increase amount of physical  activity.;Long Term: Add in home exercise to make exercise part of routine and to increase amount of physical activity.;Long Term: Exercising regularly at least 3-5 days a week. Short Term: Attend rehab on a regular basis to increase amount of physical activity.;Long Term: Add in home exercise to make exercise part of routine and to increase amount of physical activity.;Long Term: Exercising regularly at least 3-5 days a week. Short Term: Attend rehab on a regular basis to increase amount of physical activity.;Long Term: Add in home exercise to make exercise part of routine and to increase amount of physical activity.;Long Term: Exercising regularly at least 3-5 days a week.     Increase Strength and Stamina Yes Yes Yes     Intervention Provide advice, education, support and counseling about physical activity/exercise needs.;Develop an individualized exercise prescription for aerobic and resistive training based on initial evaluation findings, risk stratification, comorbidities and participant's personal goals. Provide advice, education, support and counseling about physical activity/exercise needs.;Develop an individualized exercise prescription for aerobic and resistive training based on initial evaluation findings, risk stratification, comorbidities and participant's personal goals. Provide advice, education, support and counseling about physical activity/exercise needs.;Develop an individualized exercise prescription for aerobic and resistive training based on initial evaluation findings, risk stratification, comorbidities and participant's personal goals.     Expected Outcomes Short Term: Increase workloads from initial exercise prescription for resistance, speed, and  METs.;Short Term: Perform resistance training exercises routinely during rehab and add in resistance training at home;Long Term: Improve cardiorespiratory fitness, muscular endurance and strength as measured by increased METs and functional  capacity (6MWT) Short Term: Increase workloads from initial exercise prescription for resistance, speed, and METs.;Short Term: Perform resistance training exercises routinely during rehab and add in resistance training at home;Long Term: Improve cardiorespiratory fitness, muscular endurance and strength as measured by increased METs and functional capacity (6MWT) Short Term: Increase workloads from initial exercise prescription for resistance, speed, and METs.;Short Term: Perform resistance training exercises routinely during rehab and add in resistance training at home;Long Term: Improve cardiorespiratory fitness, muscular endurance and strength as measured by increased METs and functional capacity (6MWT)     Able to understand and use rate of perceived exertion (RPE) scale Yes Yes Yes     Intervention Provide education and explanation on how to use RPE scale Provide education and explanation on how to use RPE scale Provide education and explanation on how to use RPE scale     Expected Outcomes Short Term: Able to use RPE daily in rehab to express subjective intensity level;Long Term:  Able to use RPE to guide intensity level when exercising independently Short Term: Able to use RPE daily in rehab to express subjective intensity level;Long Term:  Able to use RPE to guide intensity level when exercising independently Short Term: Able to use RPE daily in rehab to express subjective intensity level;Long Term:  Able to use RPE to guide intensity level when exercising independently     Knowledge and understanding of Target Heart Rate Range (THRR) Yes Yes Yes     Intervention Provide education and explanation of THRR including how the numbers were predicted and where they are located for reference Provide education and explanation of THRR including how the numbers were predicted and where they are located for reference Provide education and explanation of THRR including how the numbers were predicted and where they  are located for reference     Expected Outcomes Short Term: Able to state/look up THRR;Short Term: Able to use daily as guideline for intensity in rehab;Long Term: Able to use THRR to govern intensity when exercising independently Short Term: Able to state/look up THRR;Short Term: Able to use daily as guideline for intensity in rehab;Long Term: Able to use THRR to govern intensity when exercising independently Short Term: Able to state/look up THRR;Short Term: Able to use daily as guideline for intensity in rehab;Long Term: Able to use THRR to govern intensity when exercising independently     Able to check pulse independently Yes Yes Yes     Intervention Provide education and demonstration on how to check pulse in carotid and radial arteries.;Review the importance of being able to check your own pulse for safety during independent exercise Provide education and demonstration on how to check pulse in carotid and radial arteries.;Review the importance of being able to check your own pulse for safety during independent exercise Provide education and demonstration on how to check pulse in carotid and radial arteries.;Review the importance of being able to check your own pulse for safety during independent exercise     Expected Outcomes Short Term: Able to explain why pulse checking is important during independent exercise;Long Term: Able to check pulse independently and accurately Short Term: Able to explain why pulse checking is important during independent exercise;Long Term: Able to check pulse independently and accurately Short Term: Able to explain why pulse checking is important during independent  exercise;Long Term: Able to check pulse independently and accurately     Understanding of Exercise Prescription Yes Yes Yes     Intervention Provide education, explanation, and written materials on patient's individual exercise prescription Provide education, explanation, and written materials on patient's  individual exercise prescription Provide education, explanation, and written materials on patient's individual exercise prescription     Expected Outcomes Short Term: Able to explain program exercise prescription;Long Term: Able to explain home exercise prescription to exercise independently Short Term: Able to explain program exercise prescription;Long Term: Able to explain home exercise prescription to exercise independently Short Term: Able to explain program exercise prescription;Long Term: Able to explain home exercise prescription to exercise independently              Exercise Goals Re-Evaluation :  Exercise Goals Re-Evaluation     Row Name 11/23/21 1343 12/21/21 1524           Exercise Goal Re-Evaluation   Exercise Goals Review Increase Physical Activity;Increase Strength and Stamina;Able to understand and use rate of perceived exertion (RPE) scale;Knowledge and understanding of Target Heart Rate Range (THRR);Able to check pulse independently;Understanding of Exercise Prescription Increase Physical Activity;Increase Strength and Stamina;Able to understand and use rate of perceived exertion (RPE) scale;Knowledge and understanding of Target Heart Rate Range (THRR);Able to check pulse independently;Understanding of Exercise Prescription      Comments Pt has completed 6 sessions of cardiac rehab. He is going to progress slowly due to be a little deconditioned. He is motived to be in class and has enjoyed coming. He is currently exercising at 3.5 METs on the ellp. Will continue to monitor and progress as able. Pt has completed 13 session of cardiac rehab. He continues to progress slowly . He recently does not seem as motivated during class but is pushing through the class and talks to the other pts. He is currently exercising at 3.7 METs on the ellp. will continue to monitor and progress as able.      Expected Outcomes Through exercise at rehab and home, the patient will meet their stated  goals. Through exercise at rehab and home, the patient will meet their stated goals.                Discharge Exercise Prescription (Final Exercise Prescription Changes):  Exercise Prescription Changes - 12/16/21 1523       Response to Exercise   Blood Pressure (Admit) 130/68    Blood Pressure (Exercise) 112/70    Blood Pressure (Exit) 112/64    Heart Rate (Admit) 73 bpm    Heart Rate (Exercise) 98 bpm    Heart Rate (Exit) 81 bpm    Rating of Perceived Exertion (Exercise) 11    Duration Continue with 30 min of aerobic exercise without signs/symptoms of physical distress.    Intensity THRR unchanged      Progression   Progression Continue to progress workloads to maintain intensity without signs/symptoms of physical distress.      Resistance Training   Training Prescription Yes    Weight 3    Reps 10-15    Time 10 Minutes      Treadmill   MPH 1.6    Grade 0    Minutes 17    METs 2.23      Recumbant Elliptical   Level 1    RPM 50    Minutes 22    METs 3.7             Nutrition:  Target  Goals: Understanding of nutrition guidelines, daily intake of sodium '1500mg'$ , cholesterol '200mg'$ , calories 30% from fat and 7% or less from saturated fats, daily to have 5 or more servings of fruits and vegetables.  Biometrics:  Pre Biometrics - 11/10/21 0903       Pre Biometrics   Height '5\' 9"'$  (1.753 m)    Weight 65.3 kg    Waist Circumference 34.5 inches    Hip Circumference 37 inches    Waist to Hip Ratio 0.93 %    BMI (Calculated) 21.25    Triceps Skinfold 14 mm    % Body Fat 22.8 %    Grip Strength 21.9 kg    Single Leg Stand 11.09 seconds              Nutrition Therapy Plan and Nutrition Goals:  Nutrition Therapy & Goals - 11/18/21 0919       Personal Nutrition Goals   Comments Patient scored 50 on his diet assessment. We offer 2 educational sessions on heart healthy nutrition with handouts and assistance with RD referral if patient is interested.       Intervention Plan   Intervention Nutrition handout(s) given to patient.    Expected Outcomes Short Term Goal: Understand basic principles of dietary content, such as calories, fat, sodium, cholesterol and nutrients.             Nutrition Assessments:  Nutrition Assessments - 11/10/21 0834       MEDFICTS Scores   Pre Score 50            MEDIFICTS Score Key: ?70 Need to make dietary changes  40-70 Heart Healthy Diet ? 40 Therapeutic Level Cholesterol Diet   Picture Your Plate Scores: <50 Unhealthy dietary pattern with much room for improvement. 41-50 Dietary pattern unlikely to meet recommendations for good health and room for improvement. 51-60 More healthful dietary pattern, with some room for improvement.  >60 Healthy dietary pattern, although there may be some specific behaviors that could be improved.    Nutrition Goals Re-Evaluation:   Nutrition Goals Discharge (Final Nutrition Goals Re-Evaluation):   Psychosocial: Target Goals: Acknowledge presence or absence of significant depression and/or stress, maximize coping skills, provide positive support system. Participant is able to verbalize types and ability to use techniques and skills needed for reducing stress and depression.  Initial Review & Psychosocial Screening:  Initial Psych Review & Screening - 11/10/21 0859       Initial Review   Current issues with None Identified      Family Dynamics   Good Support System? Yes    Comments His support system includes his wife and his children.      Barriers   Psychosocial barriers to participate in program There are no identifiable barriers or psychosocial needs.      Screening Interventions   Interventions Encouraged to exercise    Expected Outcomes Long Term goal: The participant improves quality of Life and PHQ9 Scores as seen by post scores and/or verbalization of changes;Short Term goal: Identification and review with participant of any Quality of  Life or Depression concerns found by scoring the questionnaire.             Quality of Life Scores:  Quality of Life - 11/10/21 0904       Quality of Life   Select Quality of Life      Quality of Life Scores   Health/Function Pre 15.7 %    Socioeconomic Pre 26.79 %  Psych/Spiritual Pre 27.86 %    Family Pre 25.2 %    GLOBAL Pre 21.88 %            Scores of 19 and below usually indicate a poorer quality of life in these areas.  A difference of  2-3 points is a clinically meaningful difference.  A difference of 2-3 points in the total score of the Quality of Life Index has been associated with significant improvement in overall quality of life, self-image, physical symptoms, and general health in studies assessing change in quality of life.  PHQ-9: Review Flowsheet       11/10/2021  Depression screen PHQ 2/9  Decreased Interest 1  Down, Depressed, Hopeless 1  PHQ - 2 Score 2  Altered sleeping 0  Tired, decreased energy 3  Change in appetite 3  Feeling bad or failure about yourself  1  Trouble concentrating 3  Moving slowly or fidgety/restless 3  Suicidal thoughts 0  PHQ-9 Score 15  Difficult doing work/chores Somewhat difficult   Interpretation of Total Score  Total Score Depression Severity:  1-4 = Minimal depression, 5-9 = Mild depression, 10-14 = Moderate depression, 15-19 = Moderately severe depression, 20-27 = Severe depression   Psychosocial Evaluation and Intervention:  Psychosocial Evaluation - 11/10/21 0948       Psychosocial Evaluation & Interventions   Interventions Encouraged to exercise with the program and follow exercise prescription    Comments Pt has no barriers to participating in CR. He has no identfiable psychosocial issues. 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 He has been in and out of atrial fibrillation since his stent, and this has caused him significant SOB and fatigue.  He is often woke up early in the moring by his atrial fibrillation. The medications that he is taking due to his heart disease are causing him to have a metallic taste in his mouth. He is unable to stomach food due to this, and he reports losing about 15 lbs since his stent. He has been drinking Ensure twice per day, but otherwise is not able to consume much. He is hopeful that cardiology will elect to stent the other side of his heart in a few months, so he can be taken off of some of his medications and that his atrial fibrillation may subside. He reportst that he has a good support system with his wife and his children. His goals while in the program are to gain weight and to decrease his SOB with exertion. He is not sure that he wants to do the whole program, but I have explained to him the benefits of participating in the full 12 weeks.    Expected Outcomes Pt will continue to have no identifiable psychosocial issues.    Continue Psychosocial Services  No Follow up required             Psychosocial Re-Evaluation:  Psychosocial Re-Evaluation     [place] Name 11/18/21 [phone] 12/16/21 0841           Psychosocial Re-Evaluation   Current issues with None Identified History of Depression      Comments Patient is new to the program. He has completed 1 session. He continues to have no psychosocial barriers identified. We will continue to monitor his progress. Patient has completed 11 session. He continues to have no psychosocial barriers identified. He enjoys the the sessions and demonstrates an intertest in improving his health. Patient  is taking Cymbalta and Sertraline for depression management which he feels is managed. We will continue to monitor his progress.      Expected Outcomes Patient will continue to have no psychosocial barriers identified Patient will continue to have no psychosocial barriers identified      Interventions Relaxation education;Stress management education;Encouraged to attend  Cardiac Rehabilitation for the exercise Relaxation education;Stress management education;Encouraged to attend Cardiac Rehabilitation for the exercise      Continue Psychosocial Services  No Follow up required No Follow up required               Psychosocial Discharge (Final Psychosocial Re-Evaluation):  Psychosocial Re-Evaluation - 12/16/21 0841       Psychosocial Re-Evaluation   Current issues with History of Depression    Comments Patient has completed 11 session. He continues to have no psychosocial barriers identified. He enjoys the the sessions and demonstrates an intertest in improving his health. Patient is taking Cymbalta and Sertraline for depression management which he feels is managed. We will continue to monitor his progress.    Expected Outcomes Patient will continue to have no psychosocial barriers identified    Interventions Relaxation education;Stress management education;Encouraged to attend Cardiac Rehabilitation for the exercise    Continue Psychosocial Services  No Follow up required             Vocational Rehabilitation: Provide vocational rehab assistance to qualifying candidates.   Vocational Rehab Evaluation & Intervention:  Vocational Rehab - 11/10/21 0835       Initial Vocational Rehab Evaluation & Intervention   Assessment shows need for Vocational Rehabilitation No      Vocational Rehab Re-Evaulation   Comments retired             Education: Education Goals: Education classes will be provided on a weekly basis, covering required topics. Participant will state understanding/return demonstration of topics presented.  Learning Barriers/Preferences:  Learning Barriers/Preferences - 11/10/21 1660       Learning Barriers/Preferences   Learning Barriers Hearing    Learning Preferences Skilled Demonstration;Written Material             Education Topics: Hypertension, Hypertension Reduction -Define heart disease and high blood  pressure. Discus how high blood pressure affects the body and ways to reduce high blood pressure.   Exercise and Your Heart -Discuss why it is important to exercise, the FITT principles of exercise, normal and abnormal responses to exercise, and how to exercise safely.   Angina -Discuss definition of angina, causes of angina, treatment of angina, and how to decrease risk of having angina.   Cardiac Medications -Review what the following cardiac medications are used for, how they affect the body, and side effects that may occur when taking the medications.  Medications include Aspirin, Beta blockers, calcium channel blockers, ACE Inhibitors, angiotensin receptor blockers, diuretics, digoxin, and antihyperlipidemics.   Congestive Heart Failure -Discuss the definition of CHF, how to live with CHF, the signs and symptoms of CHF, and how keep track of weight and sodium intake. Flowsheet Row CARDIAC REHAB PHASE II EXERCISE from 12/23/2021 in [place]  Date 11/18/21  Educator DF  Instruction Review Code 1- Verbalizes Understanding       Heart Disease and Intimacy -Discus the effect sexual activity has on the heart, how changes occur during intimacy as we age, and safety during sexual activity. Flowsheet Row CARDIAC REHAB PHASE II EXERCISE from 12/23/2021 in [place]  Date 11/25/21  Educator HB  Instruction Review Code 1- Verbalizes Understanding       Smoking Cessation / COPD -Discuss different methods to quit smoking, the health benefits of quitting smoking, and the definition of COPD. Flowsheet Row CARDIAC REHAB PHASE II EXERCISE from 12/23/2021 in [place]  Date 12/02/21  Educator HB  Instruction Review Code 1- Verbalizes Understanding       Nutrition I: Fats -Discuss the types of cholesterol, what cholesterol does to the heart, and how cholesterol levels can be controlled. Flowsheet Row CARDIAC REHAB  PHASE II EXERCISE from 12/23/2021 in [place]  Date 12/14/21  Educator HB  Instruction Review Code 1- Verbalizes Understanding       Nutrition II: Labels -Discuss the different components of food labels and how to read food label [place] from 12/23/2021 in [place]  Date 12/16/21  Educator HB  Instruction Review Code 1- Verbalizes Understanding       Heart Parts/Heart Disease and PAD -Discuss the anatomy of the heart, the pathway of blood circulation through the heart, and these are affected by heart disease.   Stress I: Signs and Symptoms -Discuss the causes of stress, how stress may lead to anxiety and depression, and ways to limit stress.   Stress II: Relaxation -Discuss different types of relaxation techniques to limit stress.   Warning Signs of Stroke / TIA -Discuss definition of a stroke, what the signs and symptoms are of a stroke, and how to identify when someone is having stroke.   Knowledge Questionnaire Score:  Knowledge Questionnaire Score - 11/10/21 0833       Knowledge Questionnaire Score   Pre Score 22/24             Core Components/Risk Factors/Patient Goals at Admission:  Personal Goals and Risk Factors at Admission - 11/10/21 0905       Core Components/Risk Factors/Patient Goals on Admission    Weight Management Yes;Weight Gain    Intervention Weight Management: Provide education and appropriate resources to help participant work on and attain dietary goals.;Weight Management: Develop a combined nutrition and exercise program designed to reach desired caloric intake, while maintaining appropriate intake of nutrient and fiber, sodium and fats, and appropriate energy expenditure required for the weight goal.    Expected Outcomes Weight Gain: Understanding of general recommendations for a high calorie, high protein meal plan that promotes weight gain by  distributing calorie intake throughout the day with the consumption for 4-5 meals, snacks, and/or supplements    Improve shortness of breath with ADL's Yes    Intervention Provide education, individualized exercise plan and daily activity instruction to help decrease symptoms of SOB with activities of daily living.    Expected Outcomes Short Term: Improve cardiorespiratory fitness to achieve a reduction of symptoms when performing ADLs;Long Term: Be able to perform more ADLs without symptoms or delay the onset of symptoms    Heart Failure Yes    Intervention Provide a combined exercise and nutrition program that is supplemented with education, support and counseling about heart failure. Directed toward relieving symptoms such as shortness of breath, decreased exercise tolerance, and extremity edema.    Expected Outcomes Improve functional capacity of life;Short term: Attendance in program 2-3 days a week with increased exercise capacity. Reported lower sodium intake. Reported increased fruit and vegetable intake. Reports medication compliance.;Short term: Daily weights obtained and reported for increase. Utilizing diuretic protocols set by physician.;Long term: Adoption of self-care skills and  reduction of barriers for early signs and symptoms recognition and intervention leading to self-care maintenance.    Hypertension Yes    Intervention Provide education on lifestyle modifcations including regular physical activity/exercise, weight management, moderate sodium restriction and increased consumption of fresh fruit, vegetables, and low fat dairy, alcohol moderation, and smoking cessation.;Monitor prescription use compliance.    Expected Outcomes Short Term: Continued assessment and intervention until BP is < 140/42m HG in hypertensive participants. < 130/840mHG in hypertensive participants with diabetes, heart failure or chronic kidney disease.;Long Term: Maintenance of blood pressure at goal levels.     Lipids Yes    Intervention Provide education and support for participant on nutrition & aerobic/resistive exercise along with prescribed medications to achieve LDL '70mg'$ , HDL >'40mg'$ .    Expected Outcomes Short Term: Participant states understanding of desired cholesterol values and is compliant with medications prescribed. Participant is following exercise prescription and nutrition guidelines.;Long Term: Cholesterol controlled with medications as prescribed, with individualized exercise RX and with personalized nutrition plan. Value goals: LDL < '70mg'$ , HDL > 40 mg.    Personal Goal Other Yes    Personal Goal Get stronger    Intervention Attend CR three days per week and begin a home exercise program.    Expected Outcomes Pt will meet their stated goals.             Core Components/Risk Factors/Patient Goals Review:   Goals and Risk Factor Review     Row Name 11/18/21 0920 12/16/21 0845           Core Components/Risk Factors/Patient Goals Review   Personal Goals Review Weight Management/Obesity;Improve shortness of breath with ADL's;Hypertension;Lipids;Heart Failure;Other Weight Management/Obesity;Improve shortness of breath with ADL's;Hypertension;Lipids;Heart Failure;Other      Review Patient was referred to [facility] with STEMI/Stent placement. He has multiple risk factors for CAD and is participating in the program for risk modification. He has completed 1 session. His current weight is 144.0. His personal goals for the program are to gain weight and decrease his SOB with exertion. We will continue to monitor his progress as he works towards meeting these goals. Patient has completed 11 session. His current weight is 145.2 gaining 1 lbs from last 30 day review. He is doing well in the program with progressions and consistent attendance. His blood pressure is usually at goal. His reported glucose reading are averaging 225 mg/dl. His last A1C on file was 09/17/21 at 9.5%. He is on Lantus and  Metformin for DM control. His personal goals for the program continue to be to gain weight and decrease his SOB with exertion. We will continue to monitor his progress as he works towards meeting these goals.      Expected Outcomes Patient will complete the program meeting both personal and program goals. Patient will complete the program meeting both personal and program goals.               Core Components/Risk Factors/Patient Goals at Discharge (Final Review):   Goals and Risk Factor Review - 12/16/21 0845       Core Components/Risk Factors/Patient Goals Review   Personal Goals Review Weight Management/Obesity;Improve shortness of breath with ADL's;Hypertension;Lipids;Heart Failure;Other    Review Patient has completed 11 session. His current weight is 145.2 gaining 1 lbs from last 30 day review. He is doing well in the program with progressions and consistent attendance. His blood pressure is usually at goal. His reported glucose reading are averaging 225 mg/dl. His last A1C on file was  09/17/21 at 9.5%. He is on Lantus and Metformin for DM control. His personal goals for the program continue to be to gain weight and decrease his SOB with exertion. We will continue to monitor his progress as he works towards meeting these goals.    Expected Outcomes Patient will complete the program meeting both personal and program goals.             ITP Comments:   Comments: ITP REVIEW Pt is making expected progress toward Cardiac Rehab goals after completing 14 sessions. Recommend continued exercise, life style modification, education, and increased stamina and strength.

## 2021-12-23 NOTE — Progress Notes (Signed)
Daily Session Note  Patient Details  Name: Tommy Graham MRN: [id number] Date of Birth: 1940/10/01 Referring Provider:   Flowsheet Row CARDIAC REHAB PHASE II ORIENTATION from 11/10/2021 in [place]  Referring Provider Dr. Burt Knack       Encounter Date: 12/23/2021  Check In:  Session Check In - 12/23/21 0925       Check-In   Supervising physician immediately available to respond to emergencies [facility] MD immediately available    Physician(s) Dr Dellia Cloud    Location [facility]    Staff Present Leana Roe, BS, Exercise Physiologist;Vung Kush BSN, RN;Debra Wynetta Emery, RN, BSN    Virtual Visit No    Medication changes reported     No    Fall or balance concerns reported    No    Tobacco Cessation No Change    Warm-up and Cool-down Performed as group-led instruction    Resistance Training Performed Yes    VAD Patient? No    PAD/SET Patient? No      Pain Assessment   Currently in Pain? No/denies    Multiple Pain Sites No             Capillary Blood Glucose: No results found for this or any previous visit (from the past 24 hour(s)).    Social History   Tobacco Use  Smoking Status Never  Smokeless Tobacco Never    Goals Met:  Independence with exercise equipment Exercise tolerated well No report of concerns or symptoms today Strength training completed today  Goals Unmet:  Not Applicable  Comments: check out at 10:30   Dr. Carlyle Dolly is Medical Director for [facility]

## 2021-12-25 ENCOUNTER — Encounter (HOSPITAL_COMMUNITY)
Admission: RE | Admit: 2021-12-25 | Discharge: 2021-12-25 | Disposition: A | Discharge status: Home / Self Care | Payer: PPO | Source: Ambulatory Visit | Attending: Cardiovascular Disease | Admitting: Cardiovascular Disease

## 2021-12-25 ENCOUNTER — Telehealth (HOSPITAL_COMMUNITY): Payer: Self-pay

## 2021-12-25 DIAGNOSIS — I2111 ST elevation (STEMI) myocardial infarction involving right coronary artery: Secondary | ICD-10-CM

## 2021-12-25 DIAGNOSIS — Z955 Presence of coronary angioplasty implant and graft: Secondary | ICD-10-CM

## 2021-12-25 NOTE — Telephone Encounter (Signed)
His son, Alverda Skeans, called about his father experiencing leg cramping and wants to know if it could be related to his Lipitor. It started when he left the hospital. He wants to know if he should try something different or can take something to help with cramping.  Please advise.

## 2021-12-25 NOTE — Progress Notes (Signed)
Daily Session Note  Patient Details  Name: Tommy Graham MRN: [id number] Date of Birth: 09/30/1940 Referring Provider:   Flowsheet Row CARDIAC REHAB PHASE II ORIENTATION from 11/10/2021 in [place]  Referring Provider Dr. Burt Knack       Encounter Date: 12/25/2021  Check In:  Session Check In - 12/25/21 0926       Check-In   Supervising physician immediately available to respond to emergencies [facility] MD immediately available    Physician(s) Dr. Harl Bowie    Location [facility]    Staff Present Leana Roe, BS, Exercise Physiologist;Debra Wynetta Emery, RN, BSN;Sariah Henkin, RN;Dalton Sherrie George, MS, ACSM-CEP    Virtual Visit No    Medication changes reported     No    Fall or balance concerns reported    No    Tobacco Cessation No Change    Warm-up and Cool-down Performed as group-led instruction    Resistance Training Performed Yes    VAD Patient? No    PAD/SET Patient? No      Pain Assessment   Currently in Pain? No/denies    Multiple Pain Sites No             Capillary Blood Glucose: No results found for this or any previous visit (from the past 24 hour(s)).    Social History   Tobacco Use  Smoking Status Never  Smokeless Tobacco Never    Goals Met:  Independence with exercise equipment Exercise tolerated well No report of concerns or symptoms today Strength training completed today  Goals Unmet:  Not Applicable  Comments: check out @ 10:30   Dr. Carlyle Dolly is Medical Director for [facility]

## 2021-12-28 ENCOUNTER — Encounter (HOSPITAL_COMMUNITY)
Admission: RE | Admit: 2021-12-28 | Discharge: 2021-12-28 | Disposition: A | Discharge status: Home / Self Care | Payer: PPO | Source: Ambulatory Visit | Attending: Cardiovascular Disease | Admitting: Cardiovascular Disease

## 2021-12-28 DIAGNOSIS — Z955 Presence of coronary angioplasty implant and graft: Secondary | ICD-10-CM

## 2021-12-28 DIAGNOSIS — I2111 ST elevation (STEMI) myocardial infarction involving right coronary artery: Secondary | ICD-10-CM

## 2021-12-28 NOTE — Telephone Encounter (Signed)
I spoke with Tommy Graham and gave him updated information about holding Lipitor.  He is going to call me at the end of the week and let me know if that helped cramping.

## 2021-12-28 NOTE — Progress Notes (Signed)
Daily Session Note  Patient Details  Name: Tommy Graham MRN: [id number] Date of Birth: 04/18/1940 Referring Provider:   Flowsheet Row CARDIAC REHAB PHASE II ORIENTATION from 11/10/2021 in [place]  Referring Provider Dr. Burt Knack       Encounter Date: 12/28/2021  Check In:  Session Check In - 12/28/21 0926       Check-In   Supervising physician immediately available to respond to emergencies [facility] MD immediately available    Physician(s) Dr Domenic Polite    Location [facility]    Staff Present Leana Roe, BS, Exercise Physiologist;Dalton Sherrie George, MS, ACSM-CEP;Madelyn Flavors, RN, BSN    Virtual Visit No    Medication changes reported     No    Fall or balance concerns reported    No    Tobacco Cessation No Change    Warm-up and Cool-down Performed as group-led instruction    Resistance Training Performed Yes    VAD Patient? No    PAD/SET Patient? No      Pain Assessment   Currently in Pain? No/denies    Multiple Pain Sites No             Capillary Blood Glucose: No results found for this or any previous visit (from the past 24 hour(s)).    Social History   Tobacco Use  Smoking Status Never  Smokeless Tobacco Never    Goals Met:  Independence with exercise equipment Exercise tolerated well No report of concerns or symptoms today Strength training completed today  Goals Unmet:  Not Applicable  Comments: Checkout at 1030.   Dr. Carlyle Dolly is Medical Director for [facility] Cardiac [facility]

## 2021-12-30 ENCOUNTER — Encounter (HOSPITAL_COMMUNITY)
Admission: RE | Admit: 2021-12-30 | Discharge: 2021-12-30 | Disposition: A | Discharge status: Home / Self Care | Payer: PPO | Source: Ambulatory Visit | Attending: Cardiovascular Disease | Admitting: Cardiovascular Disease

## 2021-12-30 ENCOUNTER — Telehealth (HOSPITAL_COMMUNITY): Payer: Self-pay | Admitting: *Deleted

## 2021-12-30 DIAGNOSIS — Z955 Presence of coronary angioplasty implant and graft: Secondary | ICD-10-CM

## 2021-12-30 DIAGNOSIS — I2111 ST elevation (STEMI) myocardial infarction involving right coronary artery: Secondary | ICD-10-CM

## 2021-12-30 NOTE — Telephone Encounter (Signed)
Pts son called stating pt needs to have a tooth extracted and asked if he needed to hold any meds or if he needed an antibiotic prior to dentist appt.  Routed to [place] for advice

## 2021-12-30 NOTE — Progress Notes (Signed)
Daily Session Note  Patient Details  Name: Tommy Graham MRN: [id number] Date of Birth: March 12, 1940 Referring Provider:   Flowsheet Row CARDIAC REHAB PHASE II ORIENTATION from 11/10/2021 in [place]  Referring Provider Dr. Burt Knack       Encounter Date: 12/30/2021  Check In:  Session Check In - 12/30/21 0927       Check-In   Supervising physician immediately available to respond to emergencies [facility] MD immediately available    Physician(s) Dr Domenic Polite    Location [facility]    Staff Present Leana Roe, BS, Exercise Physiologist;Dalton Sherrie George, MS, ACSM-CEP;Madelyn Flavors, RN, BSN;Hillary Troutman BSN, RN    Virtual Visit No    Medication changes reported     No    Fall or balance concerns reported    No    Tobacco Cessation No Change    Warm-up and Cool-down Performed as group-led Higher education careers adviser Performed Yes    VAD Patient? No    PAD/SET Patient? No      Pain Assessment   Currently in Pain? No/denies    Multiple Pain Sites No             Capillary Blood Glucose: No results found for this or any previous visit (from the past 24 hour(s)).    Social History   Tobacco Use  Smoking Status Never  Smokeless Tobacco Never    Goals Met:  Independence with exercise equipment Exercise tolerated well No report of concerns or symptoms today Strength training completed today  Goals Unmet:  Not Applicable  Comments: Checkout at 1030.   Dr. Carlyle Dolly is Medical Director for [facility] Cardiac Re[facility]

## 2022-01-01 ENCOUNTER — Encounter (HOSPITAL_COMMUNITY)
Admission: RE | Admit: 2022-01-01 | Discharge: 2022-01-01 | Disposition: A | Discharge status: Home / Self Care | Payer: PPO | Source: Ambulatory Visit | Attending: Cardiovascular Disease | Admitting: Cardiovascular Disease

## 2022-01-01 DIAGNOSIS — I2111 ST elevation (STEMI) myocardial infarction involving right coronary artery: Secondary | ICD-10-CM

## 2022-01-01 DIAGNOSIS — Z955 Presence of coronary angioplasty implant and graft: Secondary | ICD-10-CM

## 2022-01-01 NOTE — Progress Notes (Signed)
Daily Session Note  Patient Details  Name: Tommy Graham MRN: [id number] Date of Birth: [birth year] Referring Provider:   Flowsheet Row CARDIAC REHAB PHASE II ORIENTATION from 11/10/2021 in [place]  Referring Provider Dr. Burt Knack       Encounter Date: 01/01/2022  Check In:  Session Check In - 01/01/22 0930       Check-In   Supervising physician immediately available to respond to emergencies [facility] MD immediately available    Physician(s) Dr. Carlyle Dolly    Location [facility]    Staff Present Leana Roe, BS, Exercise Physiologist;Dalton Sherrie George, MS, ACSM-CEP;Caliyah Sieh Wynetta Emery, RN, BSN    Virtual Visit No    Medication changes reported     No    Fall or balance concerns reported    No    Tobacco Cessation No Change    Warm-up and Cool-down Performed as group-led instruction    Resistance Training Performed Yes    VAD Patient? No    PAD/SET Patient? No      Pain Assessment   Currently in Pain? No/denies    Multiple Pain Sites No             Capillary Blood Glucose: No results found for this or any previous visit (from the past 24 hour(s)).    Social History   Tobacco Use  Smoking Status Never  Smokeless Tobacco Never    Goals Met:  Independence with exercise equipment Exercise tolerated well No report of concerns or symptoms today Strength training completed today  Goals Unmet:  Not Applicable  Comments: Check out 1030.   Dr. Carlyle Dolly is Medical Director for [facility] Card[facility]

## 2022-01-01 NOTE — Telephone Encounter (Signed)
Pt's son is aware

## 2022-01-04 ENCOUNTER — Encounter (HOSPITAL_COMMUNITY): Payer: PPO

## 2022-01-06 ENCOUNTER — Encounter (HOSPITAL_COMMUNITY)
Admission: RE | Admit: 2022-01-06 | Discharge: 2022-01-06 | Disposition: A | Discharge status: Home / Self Care | Payer: PPO | Source: Ambulatory Visit | Attending: Cardiovascular Disease | Admitting: Cardiovascular Disease

## 2022-01-06 DIAGNOSIS — I2111 ST elevation (STEMI) myocardial infarction involving right coronary artery: Secondary | ICD-10-CM

## 2022-01-06 DIAGNOSIS — Z955 Presence of coronary angioplasty implant and graft: Secondary | ICD-10-CM

## 2022-01-06 NOTE — Progress Notes (Signed)
Daily Session Note  Patient Details  Name: Tommy Graham MRN: [id number] Date of Birth: May 14, 1940 Referring Provider:   Flowsheet Row CARDIAC REHAB PHASE II ORIENTATION from 11/10/2021 in [place]  Referring Provider Dr. Burt Knack       Encounter Date: 01/06/2022  Check In:  Session Check In - 01/06/22 0927       Check-In   Supervising physician immediately available to respond to emergencies [facility] MD immediately available    Physician(s) Dr. Dellia Cloud    Location [facility]    Staff Present Leana Roe, BS, Exercise Physiologist;Dalton Sherrie George, MS, ACSM-CEP;Melven Sartorius BSN, RN    Virtual Visit No    Medication changes reported     No    Fall or balance concerns reported    No    Tobacco Cessation No Change    Warm-up and Cool-down Performed as group-led Higher education careers adviser Performed Yes    VAD Patient? No    PAD/SET Patient? No      Pain Assessment   Currently in Pain? No/denies    Multiple Pain Sites No             Capillary Blood Glucose: No results found for this or any previous visit (from the past 24 hour(s)).    Social History   Tobacco Use  Smoking Status Never  Smokeless Tobacco Never    Goals Met:  Independence with exercise equipment Exercise tolerated well No report of concerns or symptoms today Strength training completed today  Goals Unmet:  Not Applicable  Comments: check out at 10:30   Dr. Carlyle Dolly is Medical Director for [place]

## 2022-01-08 ENCOUNTER — Encounter (HOSPITAL_COMMUNITY)
Admission: RE | Admit: 2022-01-08 | Discharge: 2022-01-08 | Disposition: A | Discharge status: Home / Self Care | Payer: PPO | Source: Ambulatory Visit | Attending: Cardiovascular Disease | Admitting: Cardiovascular Disease

## 2022-01-08 DIAGNOSIS — I2111 ST elevation (STEMI) myocardial infarction involving right coronary artery: Secondary | ICD-10-CM

## 2022-01-08 DIAGNOSIS — Z955 Presence of coronary angioplasty implant and graft: Secondary | ICD-10-CM

## 2022-01-08 NOTE — Progress Notes (Signed)
Daily Session Note  Patient Details  Name: Tommy Graham MRN: [id number] Date of Birth: 1940-11-24 Referring Provider:   Flowsheet Row CARDIAC REHAB PHASE II ORIENTATION from 11/10/2021 in [place]  Referring Provider Dr. Burt Knack       Encounter Date: 01/08/2022  Check In:  Session Check In - 01/08/22 0923       Check-In   Supervising physician immediately available to respond to emergencies [facility] MD immediately available    Physician(s) Dr. Dellia Cloud    Location [facility]    Staff Present Leana Roe, BS, Exercise Physiologist;Dalton Sherrie George, MS, ACSM-CEP;Madelyn Flavors, RN, BSN    Virtual Visit No    Medication changes reported     No    Fall or balance concerns reported    No    Tobacco Cessation No Change    Warm-up and Cool-down Performed as group-led instruction    Resistance Training Performed Yes    VAD Patient? No    PAD/SET Patient? No      Pain Assessment   Currently in Pain? No/denies    Multiple Pain Sites No             Capillary Blood Glucose: No results found for this or any previous visit (from the past 24 hour(s)).    Social History   Tobacco Use  Smoking Status Never  Smokeless Tobacco Never    Goals Met:  Independence with exercise equipment Exercise tolerated well No report of concerns or symptoms today Strength training completed today  Goals Unmet:  Not Applicable  Comments: Checkout at 1030.   Dr. Carlyle Dolly is Medical Director for [facility] Card[facility] Re[facility]

## 2022-01-10 ENCOUNTER — Telehealth: Payer: Self-pay | Admitting: Physician Assistant

## 2022-01-10 NOTE — Telephone Encounter (Signed)
Paged by the patient's son regarding shortness of breath and increased lower extremity edema.  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  He is on amiodarone therapy.  In the past 3 days, he has been having increasing shortness of breath and lower extremity edema up to his knee.  He is not on any diuretic at home.  He has a history of hyperkalemia.  He has some old Lasix 20 mg tablets.  I asked him to take 2 tablets of Lasix for now.  Systolic blood pressure obtained by family member was in the 140s.  Heart rate is normal.  O2 saturation 92%.  I did discuss the case with his son, I would have very low threshold of sending this patient to the emergency room especially if he does not respond to the oral diuretic or has worsening shortness of breath or waking up in the middle of the night gasping for air.  His son is aware that he should take the patient to the emergency room if his condition does not improve.  If he does respond to the diuretic, family has been instructed to contact us again tomorrow for further instructions regarding the duration of the diuretic.

## 2022-01-11 ENCOUNTER — Telehealth: Payer: Self-pay | Admitting: Student

## 2022-01-11 ENCOUNTER — Encounter (HOSPITAL_COMMUNITY): Payer: PPO

## 2022-01-11 MED ORDER — FUROSEMIDE 40 MG PO TABS: 40.0000 mg | ORAL_TABLET | Freq: Every day | ORAL | 1 refills | Status: DC | PRN

## 2022-01-11 NOTE — Telephone Encounter (Signed)
   Patient's son (who is a Marine scientist) called Answering Service again today for follow-up from phone call yesterday. Patient's son called and spoke with [facility] yesterday. Will copy his phone note below: "Paged by the patient's son regarding shortness of breath and increased lower extremity edema.  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  He is on amiodarone therapy.  In the past 3 days, he has been having increasing shortness of breath and lower extremity edema up to his knee.  He is not on any diuretic at home.  He has a history of hyperkalemia.  He has some old Lasix 20 mg tablets.  I asked him to take 2 tablets of Lasix for now.  Systolic blood pressure obtained by family member was in the 140s.  Heart rate is normal.  O2 saturation 92%.   I did discuss the case with his son, I would have very low threshold of sending this patient to the emergency room especially if he does not respond to the oral diuretic or has worsening shortness of breath or waking up in the middle of the night gasping for air.  His son is aware that he should take the patient to the emergency room if his condition does not improve.  If he does respond to the diuretic, family has been instructed to contact us again tomorrow for further instructions regarding the duration of the diuretic."  Son state's weight is down 4.2 lbs today after taking '40mg'$  of Lasix yesterday. Weight 141 lbs down (he was 146 lbs at last office visit on 12/16/2021). His edema has significant improved as has his shortness of breath. He still has some mild lower extremity edema but much better. Son feels like breathing is close to baseline. No chest pain. Vitals stable. Recommended patient take another '40mg'$  of Lasix today and then take on an as needed basis for weight gain (3  lbs in 1 day or 5 lbs in 1 week) or worsening edema/ shortness of breath. He has previously had hyperkalemia and potassium 4.8 on last labs from 12/16/2021 so I don't think he needs to take a potassium supplement with this if he is only taking Lasix occasionally. Advised son to let us know if he is requiring PRN Lasix frequently so that we can recheck labs and likely arrange follow-up visit.  Will send in a new prescription of Lasix to requested Pharmacy.  Will route this note to Dr. Haroldine Laws just so he is aware. Dr. Haroldine Laws, son also mentioned that he was told to increase Amiodarone recently from '100mg'$  daily to '200mg'$  daily because there was some concern that his worsening shortness of breath may be due to his paroxysmal atrial fibrillation/flutter. Son now thinks it was also just due to an increase in fluid status so is wondering if he should decrease his Amiodarone back down to '100mg'$  daily. I recommended he could just continue the '200mg'$  daily for now but told him I would let you know about this as well.   Darreld Mclean, PA-C 01/11/2022 9:56 AM

## 2022-01-13 ENCOUNTER — Encounter (HOSPITAL_COMMUNITY)
Admission: RE | Admit: 2022-01-13 | Discharge: 2022-01-13 | Disposition: A | Discharge status: Home / Self Care | Payer: PPO | Source: Ambulatory Visit | Attending: Cardiovascular Disease | Admitting: Cardiovascular Disease

## 2022-01-13 DIAGNOSIS — I2111 ST elevation (STEMI) myocardial infarction involving right coronary artery: Secondary | ICD-10-CM

## 2022-01-13 DIAGNOSIS — Z955 Presence of coronary angioplasty implant and graft: Secondary | ICD-10-CM

## 2022-01-13 NOTE — Progress Notes (Signed)
Daily Session Note  Patient Details  Name: Tommy Graham MRN: [id number] Date of Birth: [birth year] Referring Provider:   Flowsheet Row CARDIAC REHAB PHASE II ORIENTATION from 11/10/2021 in [place]  Referring Provider Dr. Burt Knack       Encounter Date: 01/13/2022  Check In:  Session Check In - 01/13/22 0928       Check-In   Supervising physician immediately available to respond to emergencies [facility] MD immediately available    Physician(s) Dr. Johnsie Cancel    Location [facility]    Staff Present Melven Sartorius BSN, RN;Davey Bergsma Sherrie George, MS, ACSM-CEP;Leana Roe, BS, Exercise Physiologist    Virtual Visit No    Medication changes reported     Yes    Comments Lasix 40 mg PRN added by Sande Rives PA-C    Fall or balance concerns reported    No    Tobacco Cessation No Change    Warm-up and Cool-down Performed as group-led instruction    Resistance Training Performed Yes    VAD Patient? No    PAD/SET Patient? No      Pain Assessment   Currently in Pain? No/denies    Multiple Pain Sites No             Capillary Blood Glucose: No results found for this or any previous visit (from the past 24 hour(s)).    Social History   Tobacco Use  Smoking Status Never  Smokeless Tobacco Never    Goals Met:  Independence with exercise equipment Exercise tolerated well No report of concerns or symptoms today Strength training completed today  Goals Unmet:  Not Applicable  Comments: checkout time is 1030   Dr. Carlyle Dolly is Medical Director for [place]

## 2022-01-15 ENCOUNTER — Encounter (HOSPITAL_COMMUNITY)
Admission: RE | Admit: 2022-01-15 | Discharge: 2022-01-15 | Disposition: A | Discharge status: Home / Self Care | Payer: PPO | Source: Ambulatory Visit | Attending: Cardiovascular Disease | Admitting: Cardiovascular Disease

## 2022-01-15 DIAGNOSIS — I2111 ST elevation (STEMI) myocardial infarction involving right coronary artery: Secondary | ICD-10-CM

## 2022-01-15 DIAGNOSIS — Z955 Presence of coronary angioplasty implant and graft: Secondary | ICD-10-CM

## 2022-01-15 NOTE — Progress Notes (Signed)
Daily Session Note  Patient Details  Name: Tommy Graham MRN: [id number] Date of Birth: 19-Aug-1940 Referring Provider:   Flowsheet Row CARDIAC REHAB PHASE II ORIENTATION from 11/10/2021 in [place]  Referring Provider Dr. Burt Knack       Encounter Date: 01/15/2022  Check In:  Session Check In - 01/15/22 0928       Check-In   Supervising physician immediately available to respond to emergencies [facility] MD immediately available    Physician(s) Dr. Johnsie Cancel    Location [facility]    Staff Present Hoy Register MHA, MS, ACSM-CEP;Leana Roe, BS, Exercise Physiologist;Marybeth Dandy, Kermit Balo, RN, BSN    Virtual Visit No    Medication changes reported     No    Fall or balance concerns reported    No    Tobacco Cessation No Change    Warm-up and Cool-down Performed as group-led instruction    Resistance Training Performed Yes    VAD Patient? No    PAD/SET Patient? No      Pain Assessment   Currently in Pain? No/denies    Multiple Pain Sites No             Capillary Blood Glucose: No results found for this or any previous visit (from the past 24 hour(s)).    Social History   Tobacco Use  Smoking Status Never  Smokeless Tobacco Never    Goals Met:  Independence with exercise equipment Exercise tolerated well No report of concerns or symptoms today Strength training completed today  Goals Unmet:  Not Applicable  Comments: check out @ 10:30am   Dr. Carlyle Dolly is Medical Director for [place]

## 2022-01-18 ENCOUNTER — Encounter (HOSPITAL_COMMUNITY): Payer: PPO

## 2022-01-20 ENCOUNTER — Encounter (HOSPITAL_COMMUNITY)
Admission: RE | Admit: 2022-01-20 | Discharge: 2022-01-20 | Disposition: A | Discharge status: Home / Self Care | Payer: PPO | Source: Ambulatory Visit | Attending: Cardiovascular Disease | Admitting: Cardiovascular Disease

## 2022-01-20 DIAGNOSIS — I2111 ST elevation (STEMI) myocardial infarction involving right coronary artery: Secondary | ICD-10-CM | POA: Insufficient documentation

## 2022-01-20 DIAGNOSIS — Z955 Presence of coronary angioplasty implant and graft: Secondary | ICD-10-CM | POA: Diagnosis not present

## 2022-01-20 NOTE — Progress Notes (Signed)
Daily Session Note  Patient Details  Name: Tommy Graham MRN: [id number] Date of Birth: [birth year] Referring Provider:   Flowsheet Row CARDIAC REHAB PHASE II ORIENTATION from 11/10/2021 in [place]  Referring Provider Dr. Burt Knack       Encounter Date: 01/20/2022  Check In:  Session Check In - 01/20/22 0927       Check-In   Supervising physician immediately available to respond to emergencies [facility] MD immediately available    Physician(s) Dr. Domenic Polite    Location [facility]    Staff Present Hoy Register MHA, MS, ACSM-CEP;[employer] BSN, RN;Heather Mel Al[place], [place], Exercise Physiologist    Virtual Visit No    Medication changes reported     No    Fall or balance concerns reported    No    Tobacco Cessation No Change    Warm-up and Cool-down Performed as group-led instruction    Resistance Training Performed Yes    VAD Patient? No    PAD/SET Patient? No      Pain Assessment   Currently in Pain? No/denies    Multiple Pain Sites No             Capillary Blood Glucose: No results found for this or any previous visit (from the past 24 hour(s)).    Social History   Tobacco Use  Smoking Status Never  Smokeless Tobacco Never    Goals Met:  Independence with exercise equipment Exercise tolerated well No report of concerns or symptoms today Strength training completed today  Goals Unmet:  Not Applicable  Comments: checkout time is 1030   Dr. Carlyle Dolly is Medical Director for [place]

## 2022-01-20 NOTE — Progress Notes (Signed)
Cardiac Individual Treatment Plan  Patient Details  Name: Tommy Graham MRN: [id number] Date of Birth: 1940-03-27 Referring Provider:   Flowsheet Row CARDIAC REHAB PHASE II ORIENTATION from 11/10/2021 in [facility]  Referring Provider Dr. Burt Knack       Initial Encounter Date:  Flowsheet Row CARDIAC REHAB PHASE II ORIENTATION from 11/10/2021 in [facility]  Date 11/10/21       Visit Diagnosis: Status post coronary artery stent placement  ST elevation myocardial infarction involving right coronary artery (HCC)  Patient's Home Medications on Admission:  Current Outpatient Medications:    amiodarone (PACERONE) 200 MG tablet, Take 0.5 tablets (100 mg total) by mouth daily., Disp: 30 tablet, Rfl: 6   amoxicillin (AMOXIL) 875 MG tablet, Take 875 mg by mouth 2 (two) times daily., Disp: , Rfl:    apixaban (ELIQUIS) 5 MG TABS tablet, Take 1 tablet (5 mg total) by mouth 2 (two) times daily., Disp: 60 tablet, Rfl: 5   atorvastatin (LIPITOR) 80 MG tablet, Take 1 tablet (80 mg total) by mouth daily., Disp: 30 tablet, Rfl: 5   clopidogrel (PLAVIX) 75 MG tablet, Take 1 tablet (75 mg total) by mouth daily., Disp: 30 tablet, Rfl: 5   dorzolamide (TRUSOPT) 2 % ophthalmic solution, Place 1 drop into both eyes in the morning., Disp: , Rfl:    DULoxetine (CYMBALTA) 30 MG capsule, Take 30 mg by mouth in the morning., Disp: , Rfl:    furosemide (LASIX) 40 MG tablet, Take 1 tablet (40 mg total) by mouth daily as needed (weight gain (3lbs in 1 day or 5lbs in 1 week) or worsening lower extremity swelling or shortness of breath)., Disp: 30 tablet, Rfl: 1   insulin glargine (LANTUS) 100 UNIT/ML injection, Inject 8-20 Units into the skin at bedtime., Disp: , Rfl:    latanoprost (XALATAN) 0.005 % ophthalmic solution, Place 1 drop into both eyes at bedtime., Disp: , Rfl:    metFORMIN (GLUCOPHAGE) 850 MG tablet, Take 850 mg by mouth in the morning and at bedtime.  (Morning & afternoon), Disp: , Rfl:    ondansetron (ZOFRAN) 8 MG tablet, Take 8 mg by mouth daily in the afternoon., Disp: , Rfl:    pantoprazole (PROTONIX) 40 MG tablet, Take 1 tablet (40 mg total) by mouth 2 (two) times daily., Disp: 90 tablet, Rfl: 3   propranolol (INDERAL) 10 MG tablet, Take 10 mg by mouth 2 (two) times daily., Disp: , Rfl:    sertraline (ZOLOFT) 25 MG tablet, Take 25 mg by mouth in the morning., Disp: , Rfl:   Past Medical History: Past Medical History:  Diagnosis Date   Cancer ([place])    prostate   Diabetes mellitus without complication ([place])    Hypertension     Tobacco Use: Social History   Tobacco Use  Smoking Status Never  Smokeless Tobacco Never    Labs: Review Flowsheet  More data exists      Latest Ref Rng & Units 09/21/2021 09/22/2021 09/23/2021 09/24/2021 09/25/2021  Labs for ITP Cardiac and Pulmonary Rehab  O2 Saturation % 52.8  53.8  65.7  68.7  69.8  64     Capillary Blood Glucose: Lab Results  Component Value Date   GLUCAP 243 (H) 12/04/2021   GLUCAP 182 (H) 11/16/2021   GLUCAP 198 (H) 11/10/2021   GLUCAP 342 (H) 09/25/2021   GLUCAP 142 (H) 09/25/2021    POCT Glucose     Row Name 11/10/21 [phone]  POCT Blood Glucose   Pre-Exercise 198 mg/dL                Exercise Target Goals: Exercise Program Goal: Individual exercise prescription set using results from initial 6 min walk test and THRR while considering  patient's activity barriers and safety.   Exercise Prescription Goal: Starting with aerobic activity 30 plus minutes a day, 3 days per week for initial exercise prescription. Provide home exercise prescription and guidelines that participant acknowledges understanding prior to discharge.  Activity Barriers & Risk Stratification:  Activity Barriers & Cardiac Risk Stratification - 11/10/21 0858       Activity Barriers & Cardiac Risk Stratification   Activity Barriers Back Problems;Joint  Problems;Deconditioning;Shortness of Breath    Cardiac Risk Stratification High             6 Minute Walk:  6 Minute Walk     Row Name 11/10/21 0900         6 Minute Walk   Phase Initial     Distance 1100 feet     Walk Time 6 minutes     # of Rest Breaks 0     MPH 2.08     METS 2.28     RPE 11     VO2 Peak 8     Symptoms No     Resting HR 75 bpm     Resting BP 124/52     Resting Oxygen Saturation  99 %     Exercise Oxygen Saturation  during 6 min walk 98 %     Max Ex. HR 87 bpm     Max Ex. BP 138/58     2 Minute Post BP 120/54              Oxygen Initial Assessment:   Oxygen Re-Evaluation:   Oxygen Discharge (Final Oxygen Re-Evaluation):   Initial Exercise Prescription:  Initial Exercise Prescription - 11/10/21 0900       Date of Initial Exercise RX and Referring Provider   Date 11/10/21    Referring Provider Dr. Burt Knack    Expected Discharge Date 02/05/22      Treadmill   MPH 1    Grade 0    Minutes 17      Recumbant Elliptical   Level 1    RPM 60    Minutes 22      Prescription Details   Frequency (times per week) 3    Duration Progress to 30 minutes of continuous aerobic without signs/symptoms of physical distress      Intensity   THRR 40-80% of Max Heartrate 56-111    Ratings of Perceived Exertion 11-13      Resistance Training   Training Prescription Yes    Weight 3    Reps 10-15             Perform Capillary Blood Glucose checks as needed.  Exercise Prescription Changes:   Exercise Prescription Changes     Row Name 11/23/21 1300 12/07/21 1000 12/16/21 1523 01/01/22 1253 01/15/22 1200     Response to Exercise   Blood Pressure (Admit) 124/62 128/72 130/68 136/60 128/64   Blood Pressure (Exercise) 136/68 140/68 112/70 160/80 150/72   Blood Pressure (Exit) 130/60 116/72 112/64 122/62 110/60   Heart Rate (Admit) 72 bpm 78 bpm 73 bpm 78 bpm 72 bpm   Heart Rate (Exercise) 102 bpm 95 bpm 98 bpm 96 bpm 82 bpm   Heart  Rate (Exit) 81 bpm 82 bpm  81 bpm 87 bpm 82 bpm   Rating of Perceived Exertion (Exercise) '13 12 11 11 12   '$ Duration Continue with 30 min of aerobic exercise without signs/symptoms of physical distress. Continue with 30 min of aerobic exercise without signs/symptoms of physical distress. Continue with 30 min of aerobic exercise without signs/symptoms of physical distress. Continue with 30 min of aerobic exercise without signs/symptoms of physical distress. Continue with 30 min of aerobic exercise without signs/symptoms of physical distress.   Intensity THRR unchanged THRR unchanged THRR unchanged THRR unchanged THRR unchanged     Progression   Progression Continue to progress workloads to maintain intensity without signs/symptoms of physical distress. Continue to progress workloads to maintain intensity without signs/symptoms of physical distress. Continue to progress workloads to maintain intensity without signs/symptoms of physical distress. Continue to progress workloads to maintain intensity without signs/symptoms of physical distress. Continue to progress workloads to maintain intensity without signs/symptoms of physical distress.     Resistance Training   Training Prescription Yes Yes Yes Yes Yes   Weight '4 4 3 3 3   '$ Reps 10-15 10-15 10-15 10-15 10-15   Time 10 Minutes 10 Minutes 10 Minutes 10 Minutes 10 Minutes     Treadmill   MPH 1.7 1.6 1.6 1.7 1.5   Grade 0 0 0 0 0   Minutes '17 17 17 17 17   '$ METs 2.3 2.23 2.23 2.3 2.15     Recumbant Elliptical   Level '1 1 1 1 1   '$ RPM 50 45 50 44 44   Minutes '22 22 22 22 22   '$ METs 3.5 4 3.7 3.4 3.9            Exercise Comments:   Exercise Goals and Review:   Exercise Goals     Row Name 11/10/21 0814 11/23/21 1343 12/21/21 1524 01/19/22 1128       Exercise Goals   Increase Physical Activity Yes Yes Yes Yes    Intervention Provide advice, education, support and counseling about physical activity/exercise needs.;Develop an  individualized exercise prescription for aerobic and resistive training based on initial evaluation findings, risk stratification, comorbidities and participant's personal goals. Provide advice, education, support and counseling about physical activity/exercise needs.;Develop an individualized exercise prescription for aerobic and resistive training based on initial evaluation findings, risk stratification, comorbidities and participant's personal goals. Provide advice, education, support and counseling about physical activity/exercise needs.;Develop an individualized exercise prescription for aerobic and resistive training based on initial evaluation findings, risk stratification, comorbidities and participant's personal goals. Provide advice, education, support and counseling about physical activity/exercise needs.;Develop an individualized exercise prescription for aerobic and resistive training based on initial evaluation findings, risk stratification, comorbidities and participant's personal goals.    Expected Outcomes Short Term: Attend rehab on a regular basis to increase amount of physical activity.;Long Term: Add in home exercise to make exercise part of routine and to increase amount of physical activity.;Long Term: Exercising regularly at least 3-5 days a week. Short Term: Attend rehab on a regular basis to increase amount of physical activity.;Long Term: Add in home exercise to make exercise part of routine and to increase amount of physical activity.;Long Term: Exercising regularly at least 3-5 days a week. Short Term: Attend rehab on a regular basis to increase amount of physical activity.;Long Term: Add in home exercise to make exercise part of routine and to increase amount of physical activity.;Long Term: Exercising regularly at least 3-5 days a week. Short Term: Attend rehab on a regular basis  to increase amount of physical activity.;Long Term: Add in home exercise to make exercise part of routine  and to increase amount of physical activity.;Long Term: Exercising regularly at least 3-5 days a week.    Increase Strength and Stamina Yes Yes Yes Yes    Intervention Provide advice, education, support and counseling about physical activity/exercise needs.;Develop an individualized exercise prescription for aerobic and resistive training based on initial evaluation findings, risk stratification, comorbidities and participant's personal goals. Provide advice, education, support and counseling about physical activity/exercise needs.;Develop an individualized exercise prescription for aerobic and resistive training based on initial evaluation findings, risk stratification, comorbidities and participant's personal goals. Provide advice, education, support and counseling about physical activity/exercise needs.;Develop an individualized exercise prescription for aerobic and resistive training based on initial evaluation findings, risk stratification, comorbidities and participant's personal goals. Provide advice, education, support and counseling about physical activity/exercise needs.;Develop an individualized exercise prescription for aerobic and resistive training based on initial evaluation findings, risk stratification, comorbidities and participant's personal goals.    Expected Outcomes Short Term: Increase workloads from initial exercise prescription for resistance, speed, and METs.;Short Term: Perform resistance training exercises routinely during rehab and add in resistance training at home;Long Term: Improve cardiorespiratory fitness, muscular endurance and strength as measured by increased METs and functional capacity (6MWT) Short Term: Increase workloads from initial exercise prescription for resistance, speed, and METs.;Short Term: Perform resistance training exercises routinely during rehab and add in resistance training at home;Long Term: Improve cardiorespiratory fitness, muscular endurance and  strength as measured by increased METs and functional capacity (6MWT) Short Term: Increase workloads from initial exercise prescription for resistance, speed, and METs.;Short Term: Perform resistance training exercises routinely during rehab and add in resistance training at home;Long Term: Improve cardiorespiratory fitness, muscular endurance and strength as measured by increased METs and functional capacity (6MWT) Short Term: Increase workloads from initial exercise prescription for resistance, speed, and METs.;Short Term: Perform resistance training exercises routinely during rehab and add in resistance training at home;Long Term: Improve cardiorespiratory fitness, muscular endurance and strength as measured by increased METs and functional capacity (6MWT)    Able to understand and use rate of perceived exertion (RPE) scale Yes Yes Yes Yes    Intervention Provide education and explanation on how to use RPE scale Provide education and explanation on how to use RPE scale Provide education and explanation on how to use RPE scale Provide education and explanation on how to use RPE scale    Expected Outcomes Short Term: Able to use RPE daily in rehab to express subjective intensity level;Long Term:  Able to use RPE to guide intensity level when exercising independently Short Term: Able to use RPE daily in rehab to express subjective intensity level;Long Term:  Able to use RPE to guide intensity level when exercising independently Short Term: Able to use RPE daily in rehab to express subjective intensity level;Long Term:  Able to use RPE to guide intensity level when exercising independently Short Term: Able to use RPE daily in rehab to express subjective intensity level;Long Term:  Able to use RPE to guide intensity level when exercising independently    Knowledge and understanding of Target Heart Rate Range (THRR) Yes Yes Yes Yes    Intervention Provide education and explanation of THRR including how the numbers  were predicted and where they are located for reference Provide education and explanation of THRR including how the numbers were predicted and where they are located for reference Provide education and explanation of THRR including  how the numbers were predicted and where they are located for reference Provide education and explanation of THRR including how the numbers were predicted and where they are located for reference    Expected Outcomes Short Term: Able to state/look up THRR;Short Term: Able to use daily as guideline for intensity in rehab;Long Term: Able to use THRR to govern intensity when exercising independently Short Term: Able to state/look up THRR;Short Term: Able to use daily as guideline for intensity in rehab;Long Term: Able to use THRR to govern intensity when exercising independently Short Term: Able to state/look up THRR;Short Term: Able to use daily as guideline for intensity in rehab;Long Term: Able to use THRR to govern intensity when exercising independently Short Term: Able to state/look up THRR;Short Term: Able to use daily as guideline for intensity in rehab;Long Term: Able to use THRR to govern intensity when exercising independently    Able to check pulse independently Yes Yes Yes Yes    Intervention Provide education and demonstration on how to check pulse in carotid and radial arteries.;Review the importance of being able to check your own pulse for safety during independent exercise Provide education and demonstration on how to check pulse in carotid and radial arteries.;Review the importance of being able to check your own pulse for safety during independent exercise Provide education and demonstration on how to check pulse in carotid and radial arteries.;Review the importance of being able to check your own pulse for safety during independent exercise Provide education and demonstration on how to check pulse in carotid and radial arteries.;Review the importance of being able to  check your own pulse for safety during independent exercise    Expected Outcomes Short Term: Able to explain why pulse checking is important during independent exercise;Long Term: Able to check pulse independently and accurately Short Term: Able to explain why pulse checking is important during independent exercise;Long Term: Able to check pulse independently and accurately Short Term: Able to explain why pulse checking is important during independent exercise;Long Term: Able to check pulse independently and accurately Short Term: Able to explain why pulse checking is important during independent exercise;Long Term: Able to check pulse independently and accurately    Understanding of Exercise Prescription Yes Yes Yes Yes    Intervention Provide education, explanation, and written materials on patient's individual exercise prescription Provide education, explanation, and written materials on patient's individual exercise prescription Provide education, explanation, and written materials on patient's individual exercise prescription Provide education, explanation, and written materials on patient's individual exercise prescription    Expected Outcomes Short Term: Able to explain program exercise prescription;Long Term: Able to explain home exercise prescription to exercise independently Short Term: Able to explain program exercise prescription;Long Term: Able to explain home exercise prescription to exercise independently Short Term: Able to explain program exercise prescription;Long Term: Able to explain home exercise prescription to exercise independently Short Term: Able to explain program exercise prescription;Long Term: Able to explain home exercise prescription to exercise independently             Exercise Goals Re-Evaluation :  Exercise Goals Re-Evaluation     Row Name 11/23/21 1343 12/21/21 1524 01/19/22 1128         Exercise Goal Re-Evaluation   Exercise Goals Review Increase Physical  Activity;Increase Strength and Stamina;Able to understand and use rate of perceived exertion (RPE) scale;Knowledge and understanding of Target Heart Rate Range (THRR);Able to check pulse independently;Understanding of Exercise Prescription Increase Physical Activity;Increase Strength and Stamina;Able to understand and use  rate of perceived exertion (RPE) scale;Knowledge and understanding of Target Heart Rate Range (THRR);Able to check pulse independently;Understanding of Exercise Prescription Increase Physical Activity;Increase Strength and Stamina;Able to understand and use rate of perceived exertion (RPE) scale;Knowledge and understanding of Target Heart Rate Range (THRR);Able to check pulse independently;Understanding of Exercise Prescription     Comments Pt has completed 6 sessions of cardiac rehab. He is going to progress slowly due to be a little deconditioned. He is motived to be in class and has enjoyed coming. He is currently exercising at 3.5 METs on the ellp. Will continue to monitor and progress as able. Pt has completed 13 session of cardiac rehab. He continues to progress slowly . He recently does not seem as motivated during class but is pushing through the class and talks to the other pts. He is currently exercising at 3.7 METs on the ellp. will continue to monitor and progress as able. Pt has completed 22 sessions of CR. He is progressing slowly and is not wanting to increase levles. He does not seem motivated during class. He is currently exercising at 3.9 METs on the Ellp. Will continue to montior and progress as able.     Expected Outcomes Through exercise at rehab and home, the patient will meet their stated goals. Through exercise at rehab and home, the patient will meet their stated goals. Through exercise at rehab and home, the patient will meet their stated goals.               Discharge Exercise Prescription (Final Exercise Prescription Changes):  Exercise Prescription Changes -  01/15/22 1200       Response to Exercise   Blood Pressure (Admit) 128/64    Blood Pressure (Exercise) 150/72    Blood Pressure (Exit) 110/60    Heart Rate (Admit) 72 bpm    Heart Rate (Exercise) 82 bpm    Heart Rate (Exit) 82 bpm    Rating of Perceived Exertion (Exercise) 12    Duration Continue with 30 min of aerobic exercise without signs/symptoms of physical distress.    Intensity THRR unchanged      Progression   Progression Continue to progress workloads to maintain intensity without signs/symptoms of physical distress.      Resistance Training   Training Prescription Yes    Weight 3    Reps 10-15    Time 10 Minutes      Treadmill   MPH 1.5    Grade 0    Minutes 17    METs 2.15      Recumbant Elliptical   Level 1    RPM 44    Minutes 22    METs 3.9             Nutrition:  Target Goals: Understanding of nutrition guidelines, daily intake of sodium '1500mg'$ , cholesterol '200mg'$ , calories 30% from fat and 7% or less from saturated fats, daily to have 5 or more servings of fruits and vegetables.  Biometrics:  Pre Biometrics - 11/10/21 0903       Pre Biometrics   Height '5\' 9"'$  (1.753 m)    Weight 65.3 kg    Waist Circumference 34.5 inches    Hip Circumference 37 inches    Waist to Hip Ratio 0.93 %    BMI (Calculated) 21.25    Triceps Skinfold 14 mm    % Body Fat 22.8 %    Grip Strength 21.9 kg    Single Leg Stand 11.09 seconds  Nutrition Therapy Plan and Nutrition Goals:  Nutrition Therapy & Goals - 11/18/21 0919       Personal Nutrition Goals   Comments Patient scored 50 on his diet assessment. We offer 2 educational sessions on heart healthy nutrition with handouts and assistance with RD referral if patient is interested.      Intervention Plan   Intervention Nutrition handout(s) given to patient.    Expected Outcomes Short Term Goal: Understand basic principles of dietary content, such as calories, fat, sodium, cholesterol and  nutrients.             Nutrition Assessments:  Nutrition Assessments - 11/10/21 0834       MEDFICTS Scores   Pre Score 50            MEDIFICTS Score Key: ?70 Need to make dietary changes  40-70 Heart Healthy Diet ? 40 Therapeutic Level Cholesterol Diet   Picture Your Plate Scores: <69 Unhealthy dietary pattern with much room for improvement. 41-50 Dietary pattern unlikely to meet recommendations for good health and room for improvement. 51-60 More healthful dietary pattern, with some room for improvement.  >60 Healthy dietary pattern, although there may be some specific behaviors that could be improved.    Nutrition Goals Re-Evaluation:   Nutrition Goals Discharge (Final Nutrition Goals Re-Evaluation):   Psychosocial: Target Goals: Acknowledge presence or absence of significant depression and/or stress, maximize coping skills, provide positive support system. Participant is able to verbalize types and ability to use techniques and skills needed for reducing stress and depression.  Initial Review & Psychosocial Screening:  Initial Psych Review & Screening - 11/10/21 0859       Initial Review   Current issues with None Identified      Family Dynamics   Good Support System? Yes    Comments His support system includes his wife and his children.      Barriers   Psychosocial barriers to participate in program There are no identifiable barriers or psychosocial needs.      Screening Interventions   Interventions Encouraged to exercise    Expected Outcomes Long Term goal: The participant improves quality of Life and PHQ9 Scores as seen by post scores and/or verbalization of changes;Short Term goal: Identification and review with participant of any Quality of Life or Depression concerns found by scoring the questionnaire.             Quality of Life Scores:  Quality of Life - 11/10/21 0904       Quality of Life   Select Quality of Life      Quality of  Life Scores   Health/Function Pre 15.7 %    Socioeconomic Pre 26.79 %    Psych/Spiritual Pre 27.86 %    Family Pre 25.2 %    GLOBAL Pre 21.88 %            Scores of 19 and below usually indicate a poorer quality of life in these areas.  A difference of  2-3 points is a clinically meaningful difference.  A difference of 2-3 points in the total score of the Quality of Life Index has been associated with significant improvement in overall quality of life, self-image, physical symptoms, and general health in studies assessing change in quality of life.  PHQ-9: Review Flowsheet       11/10/2021  Depression screen PHQ 2/9  Decreased Interest 1  Down, Depressed, Hopeless 1  PHQ - 2 Score 2  Altered sleeping 0  Tired,  decreased energy 3  Change in appetite 3  Feeling bad or failure about yourself  1  Trouble concentrating 3  Moving slowly or fidgety/restless 3  Suicidal thoughts 0  PHQ-9 Score 15  Difficult doing work/chores Somewhat difficult   Interpretation of Total Score  Total Score Depression Severity:  1-4 = Minimal depression, 5-9 = Mild depression, 10-14 = Moderate depression, 15-19 = Moderately severe depression, 20-27 = Severe depression   Psychosocial Evaluation and Intervention:  Psychosocial Evaluation - 11/10/21 0948       Psychosocial Evaluation & Interventions   Interventions Encouraged to exercise with the program and follow exercise prescription    Comments Pt has no barriers to participating in CR. He has no identfiable psychosocial issues. 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 He has been in and out of atrial fibrillation since his stent, and this has caused him significant SOB and fatigue. He is often woke up early in the moring by his atrial fibrillation. The medications that he is taking due to his heart disease are causing him to have a metallic taste in his mouth. He is unable to stomach food  due to this, and he reports losing about 15 lbs since his stent. He has been drinking Ensure twice per day, but otherwise is not able to consume much. He is hopeful that cardiology will elect to stent the other side of his heart in a few months, so he can be taken off of some of his medications and that his atrial fibrillation may subside. He reportst that he has a good support system with his wife and his children. His goals while in the program are to gain weight and to decrease his SOB with exertion. He is not sure that he wants to do the whole program, but I have explained to him the benefits of participating in the full 12 weeks.    Expected Outcomes Pt will continue to have no identifiable psychosocial issues.    Continue Psychosocial Services  No Follow up required             Psychosocial Re-Evaluation:  Psychosocial Re-Evaluation     [place] Name 11/18/21 [phone] 12/16/21 0841 01/13/22 1301         Psychosocial Re-Evaluation   Current issues with None Identified History of Depression History of Depression     Comments Patient is new to the program. He has completed 1 session. He continues to have no psychosocial barriers identified. We will continue to monitor his progress. Patient has completed 11 session. He continues to have no psychosocial barriers identified. He enjoys the the sessions and demonstrates an intertest in improving his health. Patient is taking Cymbalta and Sertraline for depression management which he feels is managed. We will continue to monitor his progress. Patient has completed 20 session. He continues to have no psychosocial barriers identified. He continues to enjoy the the sessions and demonstrates an intertest in improving his health. Patient continues to take Cymbalta and Sertraline for depression management which he feels is managed. We will continue to monitor his progress.     Expected Outcomes Patient will continue to have no psychosocial barriers identified  Patient will continue to have no psychosocial barriers identified Patient will continue to have no psychosocial barriers identified     Interventions Relaxation education;Stress management education;Encouraged to attend Cardiac Rehabilitation for the exercise Relaxation education;Stress management education;Encouraged to attend Cardiac Rehabilitation for the exercise Relaxation  education;Stress management education;Encouraged to attend Cardiac Rehabilitation for the exercise     Continue Psychosocial Services  No Follow up required No Follow up required No Follow up required              Psychosocial Discharge (Final Psychosocial Re-Evaluation):  Psychosocial Re-Evaluation - 01/13/22 1301       Psychosocial Re-Evaluation   Current issues with History of Depression    Comments Patient has completed 20 session. He continues to have no psychosocial barriers identified. He continues to enjoy the the sessions and demonstrates an intertest in improving his health. Patient continues to take Cymbalta and Sertraline for depression management which he feels is managed. We will continue to monitor his progress.    Expected Outcomes Patient will continue to have no psychosocial barriers identified    Interventions Relaxation education;Stress management education;Encouraged to attend Cardiac Rehabilitation for the exercise    Continue Psychosocial Services  No Follow up required             Vocational Rehabilitation: Provide vocational rehab assistance to qualifying candidates.   Vocational Rehab Evaluation & Intervention:  Vocational Rehab - 11/10/21 0835       Initial Vocational Rehab Evaluation & Intervention   Assessment shows need for Vocational Rehabilitation No      Vocational Rehab Re-Evaulation   Comments retired             Education: Education Goals: Education classes will be provided on a weekly basis, covering required topics. Participant will state understanding/return  demonstration of topics presented.  Learning Barriers/Preferences:  Learning Barriers/Preferences - 11/10/21 4098       Learning Barriers/Preferences   Learning Barriers Hearing    Learning Preferences Skilled Demonstration;Written Material             Education Topics: Hypertension, Hypertension Reduction -Define heart disease and high blood pressure. Discus how high blood pressure affects the body and ways to reduce high blood pressure.   Exercise and Your Heart -Discuss why it is important to exercise, the FITT principles of exercise, normal and abnormal responses to exercise, and how to exercise safely.   Angina -Discuss definition of angina, causes of angina, treatment of angina, and how to decrease risk of having angina.   Cardiac Medications -Review what the following cardiac medications are used for, how they affect the body, and side effects that may occur when taking the medications.  Medications include Aspirin, Beta blockers, calcium channel blockers, ACE Inhibitors, angiotensin receptor blockers, diuretics, digoxin, and antihyperlipidemics.   Congestive Heart Failure -Discuss the definition of CHF, how to live with CHF, the signs and symptoms of CHF, and how keep track of weight and sodium intake. Flowsheet Row CARDIAC REHAB PHASE II EXERCISE from 01/13/2022 in [place]  Date 11/18/21  Educator DF  Instruction Review Code 1- Verbalizes Understanding       Heart Disease and Intimacy -Discus the effect sexual activity has on the heart, how changes occur during intimacy as we age, and safety during sexual activity. Flowsheet Row CARDIAC REHAB PHASE II EXERCISE from 01/13/2022 in [place]  Date 11/25/21  Educator HB  Instruction Review Code 1- Verbalizes Understanding       Smoking Cessation / COPD -Discuss different methods to quit smoking, the health benefits of quitting smoking, and the definition of  COPD. Flowsheet Row CARDIAC REHAB PHASE II EXERCISE from 01/13/2022 in [place]  Date 12/02/21  Educator HB  Instruction Review Code  1- Verbalizes Understanding       Nutrition I: Fats -Discuss the types of cholesterol, what cholesterol does to the heart, and how cholesterol levels can be controlled. Flowsheet Row CARDIAC REHAB PHASE II EXERCISE from 01/13/2022 in [place]  Date 12/14/21  Educator HB  Instruction Review Code 1- Verbalizes Understanding       Nutrition II: Labels -Discuss the different components of food labels and how to read food label [place] from 01/13/2022 in [place]  Date 12/16/21  Educator HB  Instruction Review Code 1- Verbalizes Understanding       Heart Parts/Heart Disease and PAD -Discuss the anatomy of the heart, the pathway of blood circulation through the heart, and these are affected by heart disease.   Stress I: Signs and Symptoms -Discuss the causes of stress, how stress may lead to anxiety and depression, and ways to limit stress. Flowsheet Row CARDIAC REHAB PHASE II EXERCISE from 01/13/2022 in [place]  Date 01/06/22  Educator HB  Instruction Review Code 1- Verbalizes Understanding       Stress II: Relaxation -Discuss different types of relaxation techniques to limit stress. Flowsheet Row CARDIAC REHAB PHASE II EXERCISE from 01/13/2022 in [place]  Date 01/13/22  Educator DF  Instruction Review Code 2- Demonstrated Understanding       Warning Signs of Stroke / TIA -Discuss definition of a stroke, what the signs and symptoms are of a stroke, and how to identify when someone is having stroke.   Knowledge Questionnaire Score:  Knowledge Questionnaire Score - 11/10/21 0833       Knowledge Questionnaire Score   Pre Score 22/24             Core  Components/Risk Factors/Patient Goals at Admission:  Personal Goals and Risk Factors at Admission - 11/10/21 0905       Core Components/Risk Factors/Patient Goals on Admission    Weight Management Yes;Weight Gain    Intervention Weight Management: Provide education and appropriate resources to help participant work on and attain dietary goals.;Weight Management: Develop a combined nutrition and exercise program designed to reach desired caloric intake, while maintaining appropriate intake of nutrient and fiber, sodium and fats, and appropriate energy expenditure required for the weight goal.    Expected Outcomes Weight Gain: Understanding of general recommendations for a high calorie, high protein meal plan that promotes weight gain by distributing calorie intake throughout the day with the consumption for 4-5 meals, snacks, and/or supplements    Improve shortness of breath with ADL's Yes    Intervention Provide education, individualized exercise plan and daily activity instruction to help decrease symptoms of SOB with activities of daily living.    Expected Outcomes Short Term: Improve cardiorespiratory fitness to achieve a reduction of symptoms when performing ADLs;Long Term: Be able to perform more ADLs without symptoms or delay the onset of symptoms    Heart Failure Yes    Intervention Provide a combined exercise and nutrition program that is supplemented with education, support and counseling about heart failure. Directed toward relieving symptoms such as shortness of breath, decreased exercise tolerance, and extremity edema.    Expected Outcomes Improve functional capacity of life;Short term: Attendance in program 2-3 days a week with increased exercise capacity. Reported lower sodium intake. Reported increased fruit and vegetable intake. Reports medication compliance.;Short term: Daily weights obtained and reported for increase. Utilizing diuretic protocols set by physician.;Long term: Adoption  of self-care skills and reduction of barriers for early signs and symptoms recognition and intervention leading to self-care maintenance.    Hypertension Yes    Intervention Provide education on lifestyle modifcations including regular physical activity/exercise, weight management, moderate sodium restriction and increased consumption of fresh fruit, vegetables, and low fat dairy, alcohol moderation, and smoking cessation.;Monitor prescription use compliance.    Expected Outcomes Short Term: Continued assessment and intervention until BP is < 140/26m HG in hypertensive participants. < 130/841mHG in hypertensive participants with diabetes, heart failure or chronic kidney disease.;Long Term: Maintenance of blood pressure at goal levels.    Lipids Yes    Intervention Provide education and support for participant on nutrition & aerobic/resistive exercise along with prescribed medications to achieve LDL '70mg'$ , HDL >'40mg'$ .    Expected Outcomes Short Term: Participant states understanding of desired cholesterol values and is compliant with medications prescribed. Participant is following exercise prescription and nutrition guidelines.;Long Term: Cholesterol controlled with medications as prescribed, with individualized exercise RX and with personalized nutrition plan. Value goals: LDL < '70mg'$ , HDL > 40 mg.    Personal Goal Other Yes    Personal Goal Get stronger    Intervention Attend CR three days per week and begin a home exercise program.    Expected Outcomes Pt will meet their stated goals.             Core Components/Risk Factors/Patient Goals Review:   Goals and Risk Factor Review     Row Name 11/18/21 0920 12/16/21 0845 01/13/22 1301         Core Components/Risk Factors/Patient Goals Review   Personal Goals Review Weight Management/Obesity;Improve shortness of breath with ADL's;Hypertension;Lipids;Heart Failure;Other Weight Management/Obesity;Improve shortness of breath with  ADL's;Hypertension;Lipids;Heart Failure;Other Weight Management/Obesity;Improve shortness of breath with ADL's;Hypertension;Lipids;Heart Failure;Other     Review Patient was referred to Cr with STEMI/Stent placement. He has multiple risk factors for CAD and is participating in the program for risk modification. He has completed 1 session. His current weight is 144.0. His personal goals for the program are to gain weight and decrease his SOB with exertion. We will continue to monitor his progress as he works towards meeting these goals. Patient has completed 11 session. His current weight is 145.2 gaining 1 lbs from last 30 day review. He is doing well in the program with progressions and consistent attendance. His blood pressure is usually at goal. His reported glucose reading are averaging 225 mg/dl. His last A1C on file was 09/17/21 at 9.5%. He is on Lantus and Metformin for DM control. His personal goals for the program continue to be to gain weight and decrease his SOB with exertion. We will continue to monitor his progress as he works towards meeting these goals. Patient has completed 20 session. His current weight is 146.6 gaining 1.4 lbs from last 30 day review. He continmues to do well in the program with progressions and consistent attendance. His blood pressure is usually at goal but has been hypertensive for the past 5 sessions.  His last A1C on file was 09/17/21 at 9.5%. He is on Lantus and Metformin for DM control. He saw Dr. BeHaroldine Lawsn HF clinic 11/29. He referred to GI for weight loss, decreased appetite and metallic taste in mouth. He also ordered a barium swallow test. [facility] due to patient having increased SOB and lower extremity edema. CaSande RivesPA added Lasix. Symptoms improved. She recommended follow up with cardiology. His personal goals  for the program continue to be to gain weight and decrease his SOB with exertion. We will  continue to monitor his progress as he works towards meeting these goals.     Expected Outcomes Patient will complete the program meeting both personal and program goals. Patient will complete the program meeting both personal and program goals. Patient will complete the program meeting both personal and program goals.              Core Components/Risk Factors/Patient Goals at Discharge (Final Review):   Goals and Risk Factor Review - 01/13/22 1301       Core Components/Risk Factors/Patient Goals Review   Personal Goals Review Weight Management/Obesity;Improve shortness of breath with ADL's;Hypertension;Lipids;Heart Failure;Other    Review Patient has completed 20 session. His current weight is 146.6 gaining 1.4 lbs from last 30 day review. He continmues to do well in the program with progressions and consistent attendance. His blood pressure is usually at goal but has been hypertensive for the past 5 sessions.  His last A1C on file was 09/17/21 at 9.5%. He is on Lantus and Metformin for DM control. He saw Dr. Haroldine Laws in HF clinic 11/29. He referred to GI for weight loss, decreased appetite and metallic taste in mouth. He also ordered a barium swallow test. Patient's son messaged cardiology through [facility] 12/24 and 12/25 due to patient having increased SOB and lower extremity edema. Sande Rives, PA added Lasix. Symptoms improved. She recommended follow up with cardiology. His personal goals for the program continue to be to gain weight and decrease his SOB with exertion. We will continue to monitor his progress as he works towards meeting these goals.    Expected Outcomes Patient will complete the program meeting both personal and program goals.             ITP Comments:   Comments: ITP REVIEW Pt is making expected progress toward Cardiac Rehab goals after completing 22 sessions. Recommend continued exercise, life style modification, education, and increased stamina and  strength.

## 2022-01-22 ENCOUNTER — Encounter (HOSPITAL_COMMUNITY)
Admission: RE | Admit: 2022-01-22 | Discharge: 2022-01-22 | Disposition: A | Discharge status: Home / Self Care | Payer: PPO | Source: Ambulatory Visit | Attending: Cardiovascular Disease | Admitting: Cardiovascular Disease

## 2022-01-22 DIAGNOSIS — Z955 Presence of coronary angioplasty implant and graft: Secondary | ICD-10-CM | POA: Diagnosis not present

## 2022-01-22 DIAGNOSIS — I2111 ST elevation (STEMI) myocardial infarction involving right coronary artery: Secondary | ICD-10-CM

## 2022-01-22 NOTE — Progress Notes (Signed)
Daily Session Note  Patient Details  Name: Tommy Graham MRN: [id number] Date of Birth: [birth year] Referring Provider:   Flowsheet Row CARDIAC REHAB PHASE II ORIENTATION from 11/10/2021 in [place]  Referring Provider Dr. Burt Knack       Encounter Date: 01/22/2022  Check In:  Session Check In - 01/22/22 0927       Check-In   Supervising physician immediately available to respond to emergencies [facility] MD immediately available    Physician(s) Dr. Domenic Polite    Location [facility]    Staff Present Hoy Register MHA, MS, ACSM-CEP;Shavonda Wiedman Wynetta Emery, RN, BSN;Heather Mel Almond, BS, Exercise Physiologist    Virtual Visit No    Medication changes reported     No    Fall or balance concerns reported    No    Tobacco Cessation No Change    Warm-up and Cool-down Performed as group-led instruction    Resistance Training Performed Yes    VAD Patient? No    PAD/SET Patient? No      Pain Assessment   Currently in Pain? No/denies    Multiple Pain Sites No             Capillary Blood Glucose: No results found for this or any previous visit (from the past 24 hour(s)).    Social History   Tobacco Use  Smoking Status Never  Smokeless Tobacco Never    Goals Met:  Independence with exercise equipment Exercise tolerated well No report of concerns or symptoms today Strength training completed today  Goals Unmet:  Not Applicable  Comments: Check out 1030.   Dr. Carlyle Dolly is Medical Director for [facility] Cardiac Rehab

## 2022-01-25 ENCOUNTER — Encounter (HOSPITAL_COMMUNITY)
Admission: RE | Admit: 2022-01-25 | Discharge: 2022-01-25 | Disposition: A | Discharge status: Home / Self Care | Payer: PPO | Source: Ambulatory Visit | Attending: Cardiovascular Disease | Admitting: Cardiovascular Disease

## 2022-01-25 DIAGNOSIS — I2111 ST elevation (STEMI) myocardial infarction involving right coronary artery: Secondary | ICD-10-CM

## 2022-01-25 DIAGNOSIS — Z955 Presence of coronary angioplasty implant and graft: Secondary | ICD-10-CM | POA: Diagnosis not present

## 2022-01-25 NOTE — Progress Notes (Signed)
Daily Session Note  Patient Details  Name: EURA RADABAUGH MRN: [id number] Date of Birth: 06/06/1940 Referring Provider:   Flowsheet Row CARDIAC REHAB PHASE II ORIENTATION from 11/10/2021 in [place]  Referring Provider Dr. Burt Knack       Encounter Date: 01/25/2022  Check In:  Session Check In - 01/25/22 0930       Check-In   Supervising physician immediately available to respond to emergencies [facility] MD immediately available    Physician(s) Dr Dellia Cloud    Location [facility]    Staff Present Hoy Register MHA, MS, ACSM-CEP;Leana Roe, BS, Exercise Physiologist;Jakim Drapeau Hassell Done, RN, BSN    Virtual Visit No    Medication changes reported     No    Comments Lasix 40 mg PRN added by Sande Rives PA-C    Fall or balance concerns reported    No    Tobacco Cessation No Change    Warm-up and Cool-down Performed as group-led instruction    Resistance Training Performed Yes    VAD Patient? No    PAD/SET Patient? No      Pain Assessment   Currently in Pain? No/denies    Multiple Pain Sites No             Capillary Blood Glucose: No results found for this or any previous visit (from the past 24 hour(s)).    Social History   Tobacco Use  Smoking Status Never  Smokeless Tobacco Never    Goals Met:  Independence with exercise equipment Exercise tolerated well No report of concerns or symptoms today Strength training completed today  Goals Unmet:  Not Applicable  Comments: Checkout 1030.   Dr. Carlyle Dolly is Medical Director for [facility] Cardiac Rehab

## 2022-01-27 ENCOUNTER — Encounter (HOSPITAL_COMMUNITY)
Admission: RE | Admit: 2022-01-27 | Discharge: 2022-01-27 | Disposition: A | Discharge status: Home / Self Care | Payer: PPO | Source: Ambulatory Visit | Attending: Cardiovascular Disease | Admitting: Cardiovascular Disease

## 2022-01-27 DIAGNOSIS — Z955 Presence of coronary angioplasty implant and graft: Secondary | ICD-10-CM

## 2022-01-27 DIAGNOSIS — I2111 ST elevation (STEMI) myocardial infarction involving right coronary artery: Secondary | ICD-10-CM

## 2022-01-27 NOTE — Progress Notes (Signed)
Daily Session Note  Patient Details  Name: Tommy Graham MRN: [id number] Date of Birth: March 07, 1940 Referring Provider:   Flowsheet Row CARDIAC REHAB PHASE II ORIENTATION from 11/10/2021 in [place]  Referring Provider Dr. Burt Knack       Encounter Date: 01/27/2022  Check In:  Session Check In - 01/27/22 0930       Check-In   Supervising physician immediately available to respond to emergencies [facility] MD immediately available    Physician(s) Dr Dellia Cloud    Location [facility]    Staff Present Hoy Register MHA, MS, ACSM-CEP;[employer] BSN, RN;Heather Mel Almond, [place], Exercise Physiologist    Virtual Visit No    Medication changes reported     No    Fall or balance concerns reported    No    Tobacco Cessation No Change    Warm-up and Cool-down Performed as group-led instruction    Resistance Training Performed Yes    VAD Patient? No    PAD/SET Patient? No      Pain Assessment   Currently in Pain? No/denies    Multiple Pain Sites No             Capillary Blood Glucose: No results found for this or any previous visit (from the past 24 hour(s)).    Social History   Tobacco Use  Smoking Status Never  Smokeless Tobacco Never    Goals Met:  Independence with exercise equipment Exercise tolerated well No report of concerns or symptoms today Strength training completed today  Goals Unmet:  Not Applicable  Comments: checkout time is 1030   Dr. Carlyle Dolly is Medical Director for [place]

## 2022-01-28 DIAGNOSIS — H04123 Dry eye syndrome of bilateral lacrimal glands: Secondary | ICD-10-CM | POA: Diagnosis not present

## 2022-01-28 DIAGNOSIS — H401131 Primary open-angle glaucoma, bilateral, mild stage: Secondary | ICD-10-CM | POA: Diagnosis not present

## 2022-01-29 ENCOUNTER — Encounter (HOSPITAL_COMMUNITY)
Admission: RE | Admit: 2022-01-29 | Discharge: 2022-01-29 | Disposition: A | Discharge status: Home / Self Care | Payer: PPO | Source: Ambulatory Visit | Attending: Cardiovascular Disease | Admitting: Cardiovascular Disease

## 2022-01-29 ENCOUNTER — Telehealth (HOSPITAL_COMMUNITY): Payer: Self-pay | Admitting: *Deleted

## 2022-01-29 DIAGNOSIS — Z955 Presence of coronary angioplasty implant and graft: Secondary | ICD-10-CM | POA: Diagnosis not present

## 2022-01-29 DIAGNOSIS — I2111 ST elevation (STEMI) myocardial infarction involving right coronary artery: Secondary | ICD-10-CM

## 2022-01-29 NOTE — Telephone Encounter (Signed)
Pts son Shawn left vm stating they are using the prn lasix about every 4-5 days because he has increased shortness of breath and swelling in feet. Shawn stated the lasix does pull the fluid off but after about 4 days he has to take it again. He said he was told to report if they had to use prn lasix more often and he said this is the most he has used it.  Routed to [place]

## 2022-01-29 NOTE — Progress Notes (Signed)
Daily Session Note  Patient Details  Name: Tommy Graham MRN: [id number] Date of Birth: [birth year] Referring Provider:   Flowsheet Row CARDIAC REHAB PHASE II ORIENTATION from 11/10/2021 in [place]  Referring Provider Dr. Burt Knack       Encounter Date: 01/29/2022  Check In:  Session Check In - 01/29/22 0929       Check-In   Supervising physician immediately available to respond to emergencies [facility] MD immediately available    Physician(s) Dr Dellia Cloud    Location [facility]    Staff Present Hoy Register MHA, MS, ACSM-CEP;Madelyn Flavors, RN, BSN;Heather Mel Almond, BS, Exercise Physiologist    Virtual Visit No    Medication changes reported     No    Fall or balance concerns reported    No    Tobacco Cessation No Change    Warm-up and Cool-down Performed as group-led instruction    Resistance Training Performed Yes    VAD Patient? No    PAD/SET Patient? No      Pain Assessment   Currently in Pain? No/denies    Multiple Pain Sites No             Capillary Blood Glucose: No results found for this or any previous visit (from the past 24 hour(s)).    Social History   Tobacco Use  Smoking Status Never  Smokeless Tobacco Never    Goals Met:  Independence with exercise equipment Exercise tolerated well No report of concerns or symptoms today Strength training completed today  Goals Unmet:  Not Applicable  Comments: checkout time is 1030   Dr. Carlyle Dolly is Medical Director for [place] [facility]

## 2022-02-01 ENCOUNTER — Ambulatory Visit (INDEPENDENT_AMBULATORY_CARE_PROVIDER_SITE_OTHER): Payer: PPO | Admitting: Gastroenterology

## 2022-02-01 ENCOUNTER — Encounter (HOSPITAL_COMMUNITY)
Admission: RE | Admit: 2022-02-01 | Discharge: 2022-02-01 | Disposition: A | Discharge status: Home / Self Care | Payer: PPO | Source: Ambulatory Visit | Attending: Cardiovascular Disease | Admitting: Cardiovascular Disease

## 2022-02-01 DIAGNOSIS — I2111 ST elevation (STEMI) myocardial infarction involving right coronary artery: Secondary | ICD-10-CM

## 2022-02-01 DIAGNOSIS — Z955 Presence of coronary angioplasty implant and graft: Secondary | ICD-10-CM

## 2022-02-01 NOTE — Telephone Encounter (Signed)
Spoke with pts son appt scheduled.

## 2022-02-01 NOTE — Telephone Encounter (Signed)
04/16/22 

## 2022-02-02 NOTE — Progress Notes (Signed)
Daily Session Note  Patient Details  Name: DVAUGHN FICKLE MRN: [id number] Date of Birth: [birth year] Referring Provider:   Flowsheet Row CARDIAC REHAB PHASE II ORIENTATION from 11/10/2021 in [place]  Referring Provider Dr. Burt Knack       Encounter Date: 02/01/2022  Check In:   Capillary Blood Glucose: No results found for this or any previous visit (from the past 24 hour(s)).    Social History   Tobacco Use  Smoking Status Never  Smokeless Tobacco Never    Goals Met:  Independence with exercise equipment Exercise tolerated well No report of concerns or symptoms today Strength training completed today  Goals Unmet:  Not Applicable  Comments: checkout time is 1030   Dr. Carlyle Dolly is Medical Director for [place]

## 2022-02-03 ENCOUNTER — Encounter (HOSPITAL_COMMUNITY)
Admission: RE | Admit: 2022-02-03 | Discharge: 2022-02-03 | Disposition: A | Discharge status: Home / Self Care | Payer: PPO | Source: Ambulatory Visit | Attending: Cardiovascular Disease | Admitting: Cardiovascular Disease

## 2022-02-03 DIAGNOSIS — I2111 ST elevation (STEMI) myocardial infarction involving right coronary artery: Secondary | ICD-10-CM

## 2022-02-03 DIAGNOSIS — Z955 Presence of coronary angioplasty implant and graft: Secondary | ICD-10-CM | POA: Diagnosis not present

## 2022-02-03 NOTE — Progress Notes (Signed)
Daily Session Note  Patient Details  Name: Tommy Graham MRN: [id number] Date of Birth: [birth year] Referring Provider:   Flowsheet Row CARDIAC REHAB PHASE II ORIENTATION from 11/10/2021 in [place]  Referring Provider Dr. Burt Knack       Encounter Date: 02/03/2022  Check In:  Session Check In - 02/03/22 0928       Check-In   Supervising physician immediately available to respond to emergencies [facility] MD immediately available    Physician(s) Dr. Harl Bowie    Location [facility]    Staff Present Leana Roe, BS, Exercise Physiologist;Dalton Sherrie George, MS, ACSM-CEP;Melven Sartorius BSN, RN    Virtual Visit No    Medication changes reported     No    Comments Lasix 40 mg PRN added by Sande Rives PA-C    Fall or balance concerns reported    No    Tobacco Cessation No Change    Warm-up and Cool-down Performed as group-led instruction    Resistance Training Performed Yes    VAD Patient? No    PAD/SET Patient? No      Pain Assessment   Currently in Pain? No/denies    Multiple Pain Sites No             Capillary Blood Glucose: No results found for this or any previous visit (from the past 24 hour(s)).    Social History   Tobacco Use  Smoking Status Never  Smokeless Tobacco Never    Goals Met:  Independence with exercise equipment Exercise tolerated well No report of concerns or symptoms today Strength training completed today  Goals Unmet:  Not Applicable  Comments: check out at 10:30   Dr. Carlyle Dolly is Medical Director for [place]

## 2022-02-04 ENCOUNTER — Telehealth (HOSPITAL_COMMUNITY): Payer: Self-pay | Admitting: Cardiology

## 2022-02-04 DIAGNOSIS — R1310 Dysphagia, unspecified: Secondary | ICD-10-CM

## 2022-02-04 NOTE — Telephone Encounter (Signed)
Pt/pts son called to request new gi referral to [facility]	[place] with concerns  Order placed

## 2022-02-05 ENCOUNTER — Encounter (HOSPITAL_COMMUNITY)
Admission: RE | Admit: 2022-02-05 | Discharge: 2022-02-05 | Disposition: A | Discharge status: Home / Self Care | Payer: PPO | Source: Ambulatory Visit | Attending: Cardiovascular Disease | Admitting: Cardiovascular Disease

## 2022-02-05 VITALS — Ht 69.0 in | Wt 143.5 lb

## 2022-02-05 DIAGNOSIS — Z955 Presence of coronary angioplasty implant and graft: Secondary | ICD-10-CM | POA: Diagnosis not present

## 2022-02-05 DIAGNOSIS — I2111 ST elevation (STEMI) myocardial infarction involving right coronary artery: Secondary | ICD-10-CM

## 2022-02-05 NOTE — Progress Notes (Signed)
Daily Session Note  Patient Details  Name: Tommy Graham MRN: [id number] Date of Birth: 1940-02-21 Referring Provider:   Flowsheet Row CARDIAC REHAB PHASE II ORIENTATION from 11/10/2021 in [place]  Referring Provider Dr. Burt Knack       Encounter Date: 02/05/2022  Check In:  Session Check In - 02/05/22 0927       Check-In   Supervising physician immediately available to respond to emergencies [facility] MD immediately available    Physician(s) Dr. Harl Bowie    Location [facility]    Staff Present Leana Roe, BS, Exercise Physiologist;Dalton Sherrie George, MS, ACSM-CEP;Madelyn Flavors, RN, BSN    Virtual Visit No    Medication changes reported     No    Comments Lasix 40 mg PRN added by Sande Rives PA-C    Fall or balance concerns reported    No    Tobacco Cessation No Change    Warm-up and Cool-down Performed as group-led instruction    Resistance Training Performed Yes    VAD Patient? No    PAD/SET Patient? No      Pain Assessment   Currently in Pain? No/denies    Multiple Pain Sites No             Capillary Blood Glucose: No results found for this or any previous visit (from the past 24 hour(s)).    Social History   Tobacco Use  Smoking Status Never  Smokeless Tobacco Never    Goals Met:  Independence with exercise equipment Exercise tolerated well No report of concerns or symptoms today Strength training completed today  Goals Unmet:  Not Applicable  Comments: checkout at 1030.   Dr. Carlyle Dolly is Medical Director for [facility]

## 2022-02-05 NOTE — Progress Notes (Signed)
I have reviewed a Home Exercise Prescription with Tommy Graham . Tommy Graham is not currently exercising at home.  The patient was advised to walk 5 days a week for 30-45 minutes.  Tommy Graham and I discussed how to progress their exercise prescription.  The patient stated that their goals were build back his strength due to getting fatigued easily .  The patient stated that they understand the exercise prescription.  We reviewed exercise guidelines, target heart rate during exercise, RPE Scale, weather conditions, NTG use, endpoints for exercise, warmup and cool down.  Patient is encouraged to come to me with any questions. I will continue to follow up with the patient to assist them with progression and safety.

## 2022-02-08 NOTE — Progress Notes (Deleted)
Discharge Progress Report  Patient Details  Name: Tommy Graham MRN: [id number] Date of Birth: 06/23/1940 Referring Provider:   Flowsheet Row CARDIAC REHAB PHASE II ORIENTATION from 11/10/2021 in [facility]  Referring Provider Dr. Burt Knack        Number of Visits: 30  Reason for Discharge:  Patient reached a stable level of exercise. Patient independent in their exercise. Patient has met program and personal goals.  Smoking History:  Social History   Tobacco Use  Smoking Status Never  Smokeless Tobacco Never    Diagnosis:  ST elevation myocardial infarction involving right coronary artery (HCC)  Status post coronary artery stent placement  ADL [facility]:   Initial Exercise Prescription:  Initial Exercise Prescription - 11/10/21 0900       Date of Initial Exercise RX and Referring Provider   Date 11/10/21    Referring Provider Dr. Burt Knack    Expected Discharge Date 02/05/22      Treadmill   MPH 1    Grade 0    Minutes 17      Recumbant Elliptical   Level 1    RPM 60    Minutes 22      Prescription Details   Frequency (times per week) 3    Duration Progress to 30 minutes of continuous aerobic without signs/symptoms of physical distress      Intensity   THRR 40-80% of Max Heartrate 56-111    Ratings of Perceived Exertion 11-13      Resistance Training   Training Prescription Yes    Weight 3    Reps 10-15             Discharge Exercise Prescription (Final Exercise Prescription Changes):  Exercise Prescription Changes - 02/03/22 1200       Home Exercise Plan   Plans to continue exercise at Home (comment)    Frequency Add 2 additional days to program exercise sessions.    Initial Home Exercises Provided 02/03/22             Functional Capacity:  6 Minute Walk     Row Name 11/10/21 0900 02/05/22 1002       6 Minute Walk   Phase Initial Discharge    Distance 1100 feet 1200 feet    Walk Time 6 minutes 6 minutes     # of Rest Breaks 0 0    MPH 2.08 2.27    METS 2.28 2.92    RPE 11 12    VO2 Peak 8 10.23    Symptoms No No    Resting HR 75 bpm 80 bpm    Resting BP 124/52 130/68    Resting Oxygen Saturation  99 % 100 %    Exercise Oxygen Saturation  during 6 min walk 98 % 98 %    Max Ex. HR 87 bpm 115 bpm    Max Ex. BP 138/58 158/70    2 Minute Post BP 120/54 128/70             Psychological, QOL, Others - Outcomes: PHQ 2/9:    02/08/2022    1:44 PM 11/10/2021    8:55 AM  Depression screen PHQ 2/9  Decreased Interest 1 1  Down, Depressed, Hopeless 1 1  PHQ - 2 Score 2 2  Altered sleeping 2 0  Tired, decreased energy 3 3  Change in appetite 3 3  Feeling bad or failure about yourself  1 1  Trouble concentrating 1 3  Moving  slowly or fidgety/restless 2 3  Suicidal thoughts 1 0  PHQ-9 Score 15 15  Difficult doing work/chores Very difficult Somewhat difficult    Quality of Life:  Quality of Life - 02/05/22 1005       Quality of Life   Select Quality of Life      Quality of Life Scores   Health/Function Pre 15.7 %    Health/Function Post 10.6 %    Health/Function % Change -32.48 %    Socioeconomic Pre 26.79 %    Socioeconomic Post 24.29 %    Socioeconomic % Change  -9.33 %    Psych/Spiritual Pre 27.86 %    Psych/Spiritual Post 24 %    Psych/Spiritual % Change -13.85 %    Family Pre 25.2 %    Family Post 19.2 %    Family % Change -23.81 %    GLOBAL Pre 21.88 %    GLOBAL Post 17.24 %    GLOBAL % Change -21.21 %             Personal Goals: Goals established at orientation with interventions provided to work toward goal.  Personal Goals and Risk Factors at Admission - 11/10/21 0905       Core Components/Risk Factors/Patient Goals on Admission    Weight Management Yes;Weight Gain    Intervention Weight Management: Provide education and appropriate resources to help participant work on and attain dietary goals.;Weight Management: Develop a combined nutrition and  exercise program designed to reach desired caloric intake, while maintaining appropriate intake of nutrient and fiber, sodium and fats, and appropriate energy expenditure required for the weight goal.    Expected Outcomes Weight Gain: Understanding of general recommendations for a high calorie, high protein meal plan that promotes weight gain by distributing calorie intake throughout the day with the consumption for 4-5 meals, snacks, and/or supplements    Improve shortness of breath with ADL's Yes    Intervention Provide education, individualized exercise plan and daily activity instruction to help decrease symptoms of SOB with activities of daily living.    Expected Outcomes Short Term: Improve cardiorespiratory fitness to achieve a reduction of symptoms when performing ADLs;Long Term: Be able to perform more ADLs without symptoms or delay the onset of symptoms    Heart Failure Yes    Intervention Provide a combined exercise and nutrition program that is supplemented with education, support and counseling about heart failure. Directed toward relieving symptoms such as shortness of breath, decreased exercise tolerance, and extremity edema.    Expected Outcomes Improve functional capacity of life;Short term: Attendance in program 2-3 days a week with increased exercise capacity. Reported lower sodium intake. Reported increased fruit and vegetable intake. Reports medication compliance.;Short term: Daily weights obtained and reported for increase. Utilizing diuretic protocols set by physician.;Long term: Adoption of self-care skills and reduction of barriers for early signs and symptoms recognition and intervention leading to self-care maintenance.    Hypertension Yes    Intervention Provide education on lifestyle modifcations including regular physical activity/exercise, weight management, moderate sodium restriction and increased consumption of fresh fruit, vegetables, and low fat dairy, alcohol moderation,  and smoking cessation.;Monitor prescription use compliance.    Expected Outcomes Short Term: Continued assessment and intervention until BP is < 140/52m HG in hypertensive participants. < 130/865mHG in hypertensive participants with diabetes, heart failure or chronic kidney disease.;Long Term: Maintenance of blood pressure at goal levels.    Lipids Yes    Intervention Provide education and support for participant  on nutrition & aerobic/resistive exercise along with prescribed medications to achieve LDL '70mg'$ , HDL >'40mg'$ .    Expected Outcomes Short Term: Participant states understanding of desired cholesterol values and is compliant with medications prescribed. Participant is following exercise prescription and nutrition guidelines.;Long Term: Cholesterol controlled with medications as prescribed, with individualized exercise RX and with personalized nutrition plan. Value goals: LDL < '70mg'$ , HDL > 40 mg.    Personal Goal Other Yes    Personal Goal Get stronger    Intervention Attend CR three days per week and begin a home exercise program.    Expected Outcomes Pt will meet their stated goals.              Personal Goals Discharge:  Goals and Risk Factor Review     Row Name 11/18/21 0920 12/16/21 0845 01/13/22 1301 02/08/22 1348       Core Components/Risk Factors/Patient Goals Review   Personal Goals Review Weight Management/Obesity;Improve shortness of breath with ADL's;Hypertension;Lipids;Heart Failure;Other Weight Management/Obesity;Improve shortness of breath with ADL's;Hypertension;Lipids;Heart Failure;Other Weight Management/Obesity;Improve shortness of breath with ADL's;Hypertension;Lipids;Heart Failure;Other Weight Management/Obesity;Improve shortness of breath with ADL's;Hypertension;Lipids;Heart Failure;Other    Review Patient was referred to [facility] with STEMI/Stent placement. He has multiple risk factors for CAD and is participating in the program for risk modification. He has completed  1 session. His current weight is 144.0. His personal goals for the program are to gain weight and decrease his SOB with exertion. We will continue to monitor his progress as he works towards meeting these goals. Patient has completed 11 session. His current weight is 145.2 gaining 1 lbs from last 30 day review. He is doing well in the program with progressions and consistent attendance. His blood pressure is usually at goal. His reported glucose reading are averaging 225 mg/dl. His last A1C on file was 09/17/21 at 9.5%. He is on Lantus and Metformin for DM control. His personal goals for the program continue to be to gain weight and decrease his SOB with exertion. We will continue to monitor his progress as he works towards meeting these goals. Patient has completed 20 session. His current weight is 146.6 gaining 1.4 lbs from last 30 day review. He continmues to do well in the program with progressions and consistent attendance. His blood pressure is usually at goal but has been hypertensive for the past 5 sessions.  His last A1C on file was 09/17/21 at 9.5%. He is on Lantus and Metformin for DM control. He saw Dr. Haroldine Laws in HF clinic 11/29. He referred to GI for weight loss, decreased appetite and metallic taste in mouth. He also ordered a barium swallow test. Patient's son messaged cardiology through [facility] 12/24 and 12/25 due to patient having increased SOB and lower extremity edema. Sande Rives, PA added Lasix. Symptoms improved. She recommended follow up with cardiology. His personal goals for the program continue to be to gain weight and decrease his SOB with exertion. We will continue to monitor his progress as he works towards meeting these goals. Pt graduated after completing 30 sessions of CR. His weight was stable while int he program. Overall, his BP at rest was elevated most sessions. He did progress some in the program, but he was unable to progress as much as he had wanted due to the blockages  in his heart. He will meet with cardiology in the next couple of weeks to determine if he is strong enough for another coronary intervention. He has significant LM disease, so he is hopeful that  they will go forward with an intervention. He still reports a decreased appetitie. Sande Rives, PA added Lasix while he was in the program due to his progressive SOB and lower extremity edema. Overall, he feels like he got stronger while in the program, and he jokes with staff that he may be back in a few months depending on what cardiology decides in regards to future stenting.    Expected Outcomes Patient will complete the program meeting both personal and program goals. Patient will complete the program meeting both personal and program goals. Patient will complete the program meeting both personal and program goals. Pt will continue to work towards their personal goals post discharge.             Exercise Goals and Review:  Exercise Goals     Row Name 11/10/21 1660 11/23/21 1343 12/21/21 1524 01/19/22 1128       Exercise Goals   Increase Physical Activity Yes Yes Yes Yes    Intervention Provide advice, education, support and counseling about physical activity/exercise needs.;Develop an individualized exercise prescription for aerobic and resistive training based on initial evaluation findings, risk stratification, comorbidities and participant's personal goals. Provide advice, education, support and counseling about physical activity/exercise needs.;Develop an individualized exercise prescription for aerobic and resistive training based on initial evaluation findings, risk stratification, comorbidities and participant's personal goals. Provide advice, education, support and counseling about physical activity/exercise needs.;Develop an individualized exercise prescription for aerobic and resistive training based on initial evaluation findings, risk stratification, comorbidities and participant's  personal goals. Provide advice, education, support and counseling about physical activity/exercise needs.;Develop an individualized exercise prescription for aerobic and resistive training based on initial evaluation findings, risk stratification, comorbidities and participant's personal goals.    Expected Outcomes Short Term: Attend rehab on a regular basis to increase amount of physical activity.;Long Term: Add in home exercise to make exercise part of routine and to increase amount of physical activity.;Long Term: Exercising regularly at least 3-5 days a week. Short Term: Attend rehab on a regular basis to increase amount of physical activity.;Long Term: Add in home exercise to make exercise part of routine and to increase amount of physical activity.;Long Term: Exercising regularly at least 3-5 days a week. Short Term: Attend rehab on a regular basis to increase amount of physical activity.;Long Term: Add in home exercise to make exercise part of routine and to increase amount of physical activity.;Long Term: Exercising regularly at least 3-5 days a week. Short Term: Attend rehab on a regular basis to increase amount of physical activity.;Long Term: Add in home exercise to make exercise part of routine and to increase amount of physical activity.;Long Term: Exercising regularly at least 3-5 days a week.    Increase Strength and Stamina Yes Yes Yes Yes    Intervention Provide advice, education, support and counseling about physical activity/exercise needs.;Develop an individualized exercise prescription for aerobic and resistive training based on initial evaluation findings, risk stratification, comorbidities and participant's personal goals. Provide advice, education, support and counseling about physical activity/exercise needs.;Develop an individualized exercise prescription for aerobic and resistive training based on initial evaluation findings, risk stratification, comorbidities and participant's personal  goals. Provide advice, education, support and counseling about physical activity/exercise needs.;Develop an individualized exercise prescription for aerobic and resistive training based on initial evaluation findings, risk stratification, comorbidities and participant's personal goals. Provide advice, education, support and counseling about physical activity/exercise needs.;Develop an individualized exercise prescription for aerobic and resistive training based on initial evaluation findings, risk stratification,  comorbidities and participant's personal goals.    Expected Outcomes Short Term: Increase workloads from initial exercise prescription for resistance, speed, and METs.;Short Term: Perform resistance training exercises routinely during rehab and add in resistance training at home;Long Term: Improve cardiorespiratory fitness, muscular endurance and strength as measured by increased METs and functional capacity (6MWT) Short Term: Increase workloads from initial exercise prescription for resistance, speed, and METs.;Short Term: Perform resistance training exercises routinely during rehab and add in resistance training at home;Long Term: Improve cardiorespiratory fitness, muscular endurance and strength as measured by increased METs and functional capacity (6MWT) Short Term: Increase workloads from initial exercise prescription for resistance, speed, and METs.;Short Term: Perform resistance training exercises routinely during rehab and add in resistance training at home;Long Term: Improve cardiorespiratory fitness, muscular endurance and strength as measured by increased METs and functional capacity (6MWT) Short Term: Increase workloads from initial exercise prescription for resistance, speed, and METs.;Short Term: Perform resistance training exercises routinely during rehab and add in resistance training at home;Long Term: Improve cardiorespiratory fitness, muscular endurance and strength as measured by  increased METs and functional capacity (6MWT)    Able to understand and use rate of perceived exertion (RPE) scale Yes Yes Yes Yes    Intervention Provide education and explanation on how to use RPE scale Provide education and explanation on how to use RPE scale Provide education and explanation on how to use RPE scale Provide education and explanation on how to use RPE scale    Expected Outcomes Short Term: Able to use RPE daily in rehab to express subjective intensity level;Long Term:  Able to use RPE to guide intensity level when exercising independently Short Term: Able to use RPE daily in rehab to express subjective intensity level;Long Term:  Able to use RPE to guide intensity level when exercising independently Short Term: Able to use RPE daily in rehab to express subjective intensity level;Long Term:  Able to use RPE to guide intensity level when exercising independently Short Term: Able to use RPE daily in rehab to express subjective intensity level;Long Term:  Able to use RPE to guide intensity level when exercising independently    Knowledge and understanding of Target Heart Rate Range (THRR) Yes Yes Yes Yes    Intervention Provide education and explanation of THRR including how the numbers were predicted and where they are located for reference Provide education and explanation of THRR including how the numbers were predicted and where they are located for reference Provide education and explanation of THRR including how the numbers were predicted and where they are located for reference Provide education and explanation of THRR including how the numbers were predicted and where they are located for reference    Expected Outcomes Short Term: Able to state/look up THRR;Short Term: Able to use daily as guideline for intensity in rehab;Long Term: Able to use THRR to govern intensity when exercising independently Short Term: Able to state/look up THRR;Short Term: Able to use daily as guideline for  intensity in rehab;Long Term: Able to use THRR to govern intensity when exercising independently Short Term: Able to state/look up THRR;Short Term: Able to use daily as guideline for intensity in rehab;Long Term: Able to use THRR to govern intensity when exercising independently Short Term: Able to state/look up THRR;Short Term: Able to use daily as guideline for intensity in rehab;Long Term: Able to use THRR to govern intensity when exercising independently    Able to check pulse independently Yes Yes Yes Yes    Intervention  Provide education and demonstration on how to check pulse in carotid and radial arteries.;Review the importance of being able to check your own pulse for safety during independent exercise Provide education and demonstration on how to check pulse in carotid and radial arteries.;Review the importance of being able to check your own pulse for safety during independent exercise Provide education and demonstration on how to check pulse in carotid and radial arteries.;Review the importance of being able to check your own pulse for safety during independent exercise Provide education and demonstration on how to check pulse in carotid and radial arteries.;Review the importance of being able to check your own pulse for safety during independent exercise    Expected Outcomes Short Term: Able to explain why pulse checking is important during independent exercise;Long Term: Able to check pulse independently and accurately Short Term: Able to explain why pulse checking is important during independent exercise;Long Term: Able to check pulse independently and accurately Short Term: Able to explain why pulse checking is important during independent exercise;Long Term: Able to check pulse independently and accurately Short Term: Able to explain why pulse checking is important during independent exercise;Long Term: Able to check pulse independently and accurately    Understanding of Exercise Prescription  Yes Yes Yes Yes    Intervention Provide education, explanation, and written materials on patient's individual exercise prescription Provide education, explanation, and written materials on patient's individual exercise prescription Provide education, explanation, and written materials on patient's individual exercise prescription Provide education, explanation, and written materials on patient's individual exercise prescription    Expected Outcomes Short Term: Able to explain program exercise prescription;Long Term: Able to explain home exercise prescription to exercise independently Short Term: Able to explain program exercise prescription;Long Term: Able to explain home exercise prescription to exercise independently Short Term: Able to explain program exercise prescription;Long Term: Able to explain home exercise prescription to exercise independently Short Term: Able to explain program exercise prescription;Long Term: Able to explain home exercise prescription to exercise independently             Exercise Goals Re-Evaluation:  Exercise Goals Re-Evaluation     Row Name 11/23/21 1343 12/21/21 1524 01/19/22 1128         Exercise Goal Re-Evaluation   Exercise Goals Review Increase Physical Activity;Increase Strength and Stamina;Able to understand and use rate of perceived exertion (RPE) scale;Knowledge and understanding of Target Heart Rate Range (THRR);Able to check pulse independently;Understanding of Exercise Prescription Increase Physical Activity;Increase Strength and Stamina;Able to understand and use rate of perceived exertion (RPE) scale;Knowledge and understanding of Target Heart Rate Range (THRR);Able to check pulse independently;Understanding of Exercise Prescription Increase Physical Activity;Increase Strength and Stamina;Able to understand and use rate of perceived exertion (RPE) scale;Knowledge and understanding of Target Heart Rate Range (THRR);Able to check pulse  independently;Understanding of Exercise Prescription     Comments Pt has completed 6 sessions of cardiac rehab. He is going to progress slowly due to be a little deconditioned. He is motived to be in class and has enjoyed coming. He is currently exercising at 3.5 METs on the ellp. Will continue to monitor and progress as able. Pt has completed 13 session of cardiac rehab. He continues to progress slowly . He recently does not seem as motivated during class but is pushing through the class and talks to the other pts. He is currently exercising at 3.7 METs on the ellp. will continue to monitor and progress as able. Pt has completed 22 sessions of CR. He is progressing  slowly and is not wanting to increase levles. He does not seem motivated during class. He is currently exercising at 3.9 METs on the Ellp. Will continue to montior and progress as able.     Expected Outcomes Through exercise at rehab and home, the patient will meet their stated goals. Through exercise at rehab and home, the patient will meet their stated goals. Through exercise at rehab and home, the patient will meet their stated goals.              Nutrition & Weight - Outcomes:  Pre Biometrics - 11/10/21 0903       Pre Biometrics   Height '5\' 9"'$  (1.753 m)    Weight 143 lb 15.4 oz (65.3 kg)    Waist Circumference 34.5 inches    Hip Circumference 37 inches    Waist to Hip Ratio 0.93 %    BMI (Calculated) 21.25    Triceps Skinfold 14 mm    % Body Fat 22.8 %    Grip Strength 21.9 kg    Single Leg Stand 11.09 seconds             Post Biometrics - 02/05/22 1004        Post  Biometrics   Height '5\' 9"'$  (1.753 m)    Weight 143 lb 8.3 oz (65.1 kg)    Waist Circumference 33 inches    Hip Circumference 35.5 inches    Waist to Hip Ratio 0.93 %    BMI (Calculated) 21.18    Triceps Skinfold 10 mm    % Body Fat 20.7 %    Grip Strength 17.5 kg    Flexibility 0 in    Single Leg Stand 10.25 seconds              Nutrition:  Nutrition Therapy & Goals - 11/18/21 0919       Personal Nutrition Goals   Comments Patient scored 50 on his diet assessment. We offer 2 educational sessions on heart healthy nutrition with handouts and assistance with RD referral if patient is interested.      Intervention Plan   Intervention Nutrition handout(s) given to patient.    Expected Outcomes Short Term Goal: Understand basic principles of dietary content, such as calories, fat, sodium, cholesterol and nutrients.             Nutrition Discharge:  Nutrition Assessments - 02/08/22 1343       MEDFICTS Scores   Pre Score 50    Post Score 53    Score Difference 3             Education Questionnaire Score:  Knowledge Questionnaire Score - 02/08/22 1341       Knowledge Questionnaire Score   Pre Score 22/24    Post Score 20/24             Goals reviewed with patient; copy given to patient. Pt graduated from [facility] after 30 sessions. He was able to improve his walk test distance by 8.3%, and his MET level at discharge was 3.8. He reports that he will continue to exercise by walking at home when the weather permits. He will see cardiology soon to decide if he will go through with another coronary intervention.

## 2022-02-08 NOTE — Progress Notes (Signed)
Discharge Progress Report  Patient Details  Name: Tommy Graham MRN: [id number] Date of Birth: 06-04-1940 Referring Provider:   Flowsheet Row CARDIAC REHAB PHASE II ORIENTATION from 11/10/2021 in [facility]  Referring Provider Dr. Burt Knack        Number of Visits: 30  Reason for Discharge:  Patient reached a stable level of exercise. Patient independent in their exercise. Patient has met program and personal goals.  Smoking History:  Social History   Tobacco Use  Smoking Status Never  Smokeless Tobacco Never    Diagnosis:  ST elevation myocardial infarction involving right coronary artery (HCC)  Status post coronary artery stent placement  ADL [facility]:   Initial Exercise Prescription:  Initial Exercise Prescription - 11/10/21 0900       Date of Initial Exercise RX and Referring Provider   Date 11/10/21    Referring Provider Dr. Burt Knack    Expected Discharge Date 02/05/22      Treadmill   MPH 1    Grade 0    Minutes 17      Recumbant Elliptical   Level 1    RPM 60    Minutes 22      Prescription Details   Frequency (times per week) 3    Duration Progress to 30 minutes of continuous aerobic without signs/symptoms of physical distress      Intensity   THRR 40-80% of Max Heartrate 56-111    Ratings of Perceived Exertion 11-13      Resistance Training   Training Prescription Yes    Weight 3    Reps 10-15             Discharge Exercise Prescription (Final Exercise Prescription Changes):  Exercise Prescription Changes - 02/03/22 1200       Home Exercise Plan   Plans to continue exercise at Home (comment)    Frequency Add 2 additional days to program exercise sessions.    Initial Home Exercises Provided 02/03/22             Functional Capacity:  6 Minute Walk     Row Name 11/10/21 0900 02/05/22 1002       6 Minute Walk   Phase Initial Discharge    Distance 1100 feet 1200 feet    Walk Time 6 minutes 6 minutes     # of Rest Breaks 0 0    MPH 2.08 2.27    METS 2.28 2.92    RPE 11 12    VO2 Peak 8 10.23    Symptoms No No    Resting HR 75 bpm 80 bpm    Resting BP 124/52 130/68    Resting Oxygen Saturation  99 % 100 %    Exercise Oxygen Saturation  during 6 min walk 98 % 98 %    Max Ex. HR 87 bpm 115 bpm    Max Ex. BP 138/58 158/70    2 Minute Post BP 120/54 128/70             Psychological, QOL, Others - Outcomes: PHQ 2/9:    02/08/2022    1:44 PM 11/10/2021    8:55 AM  Depression screen PHQ 2/9  Decreased Interest 1 1  Down, Depressed, Hopeless 1 1  PHQ - 2 Score 2 2  Altered sleeping 2 0  Tired, decreased energy 3 3  Change in appetite 3 3  Feeling bad or failure about yourself  1 1  Trouble concentrating 1 3  Moving  slowly or fidgety/restless 2 3  Suicidal thoughts 1 0  PHQ-9 Score 15 15  Difficult doing work/chores Very difficult Somewhat difficult    Quality of Life:  Quality of Life - 02/05/22 1005       Quality of Life   Select Quality of Life      Quality of Life Scores   Health/Function Pre 15.7 %    Health/Function Post 10.6 %    Health/Function % Change -32.48 %    Socioeconomic Pre 26.79 %    Socioeconomic Post 24.29 %    Socioeconomic % Change  -9.33 %    Psych/Spiritual Pre 27.86 %    Psych/Spiritual Post 24 %    Psych/Spiritual % Change -13.85 %    Family Pre 25.2 %    Family Post 19.2 %    Family % Change -23.81 %    GLOBAL Pre 21.88 %    GLOBAL Post 17.24 %    GLOBAL % Change -21.21 %             Personal Goals: Goals established at orientation with interventions provided to work toward goal.  Personal Goals and Risk Factors at Admission - 11/10/21 0905       Core Components/Risk Factors/Patient Goals on Admission    Weight Management Yes;Weight Gain    Intervention Weight Management: Provide education and appropriate resources to help participant work on and attain dietary goals.;Weight Management: Develop a combined nutrition and  exercise program designed to reach desired caloric intake, while maintaining appropriate intake of nutrient and fiber, sodium and fats, and appropriate energy expenditure required for the weight goal.    Expected Outcomes Weight Gain: Understanding of general recommendations for a high calorie, high protein meal plan that promotes weight gain by distributing calorie intake throughout the day with the consumption for 4-5 meals, snacks, and/or supplements    Improve shortness of breath with ADL's Yes    Intervention Provide education, individualized exercise plan and daily activity instruction to help decrease symptoms of SOB with activities of daily living.    Expected Outcomes Short Term: Improve cardiorespiratory fitness to achieve a reduction of symptoms when performing ADLs;Long Term: Be able to perform more ADLs without symptoms or delay the onset of symptoms    Heart Failure Yes    Intervention Provide a combined exercise and nutrition program that is supplemented with education, support and counseling about heart failure. Directed toward relieving symptoms such as shortness of breath, decreased exercise tolerance, and extremity edema.    Expected Outcomes Improve functional capacity of life;Short term: Attendance in program 2-3 days a week with increased exercise capacity. Reported lower sodium intake. Reported increased fruit and vegetable intake. Reports medication compliance.;Short term: Daily weights obtained and reported for increase. Utilizing diuretic protocols set by physician.;Long term: Adoption of self-care skills and reduction of barriers for early signs and symptoms recognition and intervention leading to self-care maintenance.    Hypertension Yes    Intervention Provide education on lifestyle modifcations including regular physical activity/exercise, weight management, moderate sodium restriction and increased consumption of fresh fruit, vegetables, and low fat dairy, alcohol moderation,  and smoking cessation.;Monitor prescription use compliance.    Expected Outcomes Short Term: Continued assessment and intervention until BP is < 140/9m HG in hypertensive participants. < 130/861mHG in hypertensive participants with diabetes, heart failure or chronic kidney disease.;Long Term: Maintenance of blood pressure at goal levels.    Lipids Yes    Intervention Provide education and support for participant  on nutrition & aerobic/resistive exercise along with prescribed medications to achieve LDL '70mg'$ , HDL >'40mg'$ .    Expected Outcomes Short Term: Participant states understanding of desired cholesterol values and is compliant with medications prescribed. Participant is following exercise prescription and nutrition guidelines.;Long Term: Cholesterol controlled with medications as prescribed, with individualized exercise RX and with personalized nutrition plan. Value goals: LDL < '70mg'$ , HDL > 40 mg.    Personal Goal Other Yes    Personal Goal Get stronger    Intervention Attend CR three days per week and begin a home exercise program.    Expected Outcomes Pt will meet their stated goals.              Personal Goals Discharge:  Goals and Risk Factor Review     Row Name 11/18/21 0920 12/16/21 0845 01/13/22 1301 02/08/22 1348       Core Components/Risk Factors/Patient Goals Review   Personal Goals Review Weight Management/Obesity;Improve shortness of breath with ADL's;Hypertension;Lipids;Heart Failure;Other Weight Management/Obesity;Improve shortness of breath with ADL's;Hypertension;Lipids;Heart Failure;Other Weight Management/Obesity;Improve shortness of breath with ADL's;Hypertension;Lipids;Heart Failure;Other Weight Management/Obesity;Improve shortness of breath with ADL's;Hypertension;Lipids;Heart Failure;Other    Review Patient was referred to [facility] with STEMI/Stent placement. He has multiple risk factors for CAD and is participating in the program for risk modification. He has completed  1 session. His current weight is 144.0. His personal goals for the program are to gain weight and decrease his SOB with exertion. We will continue to monitor his progress as he works towards meeting these goals. Patient has completed 11 session. His current weight is 145.2 gaining 1 lbs from last 30 day review. He is doing well in the program with progressions and consistent attendance. His blood pressure is usually at goal. His reported glucose reading are averaging 225 mg/dl. His last A1C on file was 09/17/21 at 9.5%. He is on Lantus and Metformin for DM control. His personal goals for the program continue to be to gain weight and decrease his SOB with exertion. We will continue to monitor his progress as he works towards meeting these goals. Patient has completed 20 session. His current weight is 146.6 gaining 1.4 lbs from last 30 day review. He continmues to do well in the program with progressions and consistent attendance. His blood pressure is usually at goal but has been hypertensive for the past 5 sessions.  His last A1C on file was 09/17/21 at 9.5%. He is on Lantus and Metformin for DM control. He saw Dr. Haroldine Laws in HF clinic 11/29. He referred to GI for weight loss, decreased appetite and metallic taste in mouth. He also ordered a barium swallow test. Patient's son messaged cardiology through [facility] 12/24 and 12/25 due to patient having increased SOB and lower extremity edema. Sande Rives, PA added Lasix. Symptoms improved. She recommended follow up with cardiology. His personal goals for the program continue to be to gain weight and decrease his SOB with exertion. We will continue to monitor his progress as he works towards meeting these goals. Pt graduated after completing 30 sessions of CR. His weight was stable while int he program. Overall, his BP at rest was elevated most sessions. He did progress some in the program, but he was unable to progress as much as he had wanted due to the blockages  in his heart. He will meet with cardiology in the next couple of weeks to determine if he is strong enough for another coronary intervention. He has significant LM disease, so he is hopeful that  they will go forward with an intervention. He still reports a decreased appetitie. Sande Rives, PA added Lasix while he was in the program due to his progressive SOB and lower extremity edema. Overall, he feels like he got stronger while in the program, and he jokes with staff that he may be back in a few months depending on what cardiology decides in regards to future stenting.    Expected Outcomes Patient will complete the program meeting both personal and program goals. Patient will complete the program meeting both personal and program goals. Patient will complete the program meeting both personal and program goals. Pt will continue to work towards their personal goals post discharge.             Exercise Goals and Review:  Exercise Goals     Row Name 11/10/21 3536 11/23/21 1343 12/21/21 1524 01/19/22 1128       Exercise Goals   Increase Physical Activity Yes Yes Yes Yes    Intervention Provide advice, education, support and counseling about physical activity/exercise needs.;Develop an individualized exercise prescription for aerobic and resistive training based on initial evaluation findings, risk stratification, comorbidities and participant's personal goals. Provide advice, education, support and counseling about physical activity/exercise needs.;Develop an individualized exercise prescription for aerobic and resistive training based on initial evaluation findings, risk stratification, comorbidities and participant's personal goals. Provide advice, education, support and counseling about physical activity/exercise needs.;Develop an individualized exercise prescription for aerobic and resistive training based on initial evaluation findings, risk stratification, comorbidities and participant's  personal goals. Provide advice, education, support and counseling about physical activity/exercise needs.;Develop an individualized exercise prescription for aerobic and resistive training based on initial evaluation findings, risk stratification, comorbidities and participant's personal goals.    Expected Outcomes Short Term: Attend rehab on a regular basis to increase amount of physical activity.;Long Term: Add in home exercise to make exercise part of routine and to increase amount of physical activity.;Long Term: Exercising regularly at least 3-5 days a week. Short Term: Attend rehab on a regular basis to increase amount of physical activity.;Long Term: Add in home exercise to make exercise part of routine and to increase amount of physical activity.;Long Term: Exercising regularly at least 3-5 days a week. Short Term: Attend rehab on a regular basis to increase amount of physical activity.;Long Term: Add in home exercise to make exercise part of routine and to increase amount of physical activity.;Long Term: Exercising regularly at least 3-5 days a week. Short Term: Attend rehab on a regular basis to increase amount of physical activity.;Long Term: Add in home exercise to make exercise part of routine and to increase amount of physical activity.;Long Term: Exercising regularly at least 3-5 days a week.    Increase Strength and Stamina Yes Yes Yes Yes    Intervention Provide advice, education, support and counseling about physical activity/exercise needs.;Develop an individualized exercise prescription for aerobic and resistive training based on initial evaluation findings, risk stratification, comorbidities and participant's personal goals. Provide advice, education, support and counseling about physical activity/exercise needs.;Develop an individualized exercise prescription for aerobic and resistive training based on initial evaluation findings, risk stratification, comorbidities and participant's personal  goals. Provide advice, education, support and counseling about physical activity/exercise needs.;Develop an individualized exercise prescription for aerobic and resistive training based on initial evaluation findings, risk stratification, comorbidities and participant's personal goals. Provide advice, education, support and counseling about physical activity/exercise needs.;Develop an individualized exercise prescription for aerobic and resistive training based on initial evaluation findings, risk stratification,  comorbidities and participant's personal goals.    Expected Outcomes Short Term: Increase workloads from initial exercise prescription for resistance, speed, and METs.;Short Term: Perform resistance training exercises routinely during rehab and add in resistance training at home;Long Term: Improve cardiorespiratory fitness, muscular endurance and strength as measured by increased METs and functional capacity (6MWT) Short Term: Increase workloads from initial exercise prescription for resistance, speed, and METs.;Short Term: Perform resistance training exercises routinely during rehab and add in resistance training at home;Long Term: Improve cardiorespiratory fitness, muscular endurance and strength as measured by increased METs and functional capacity (6MWT) Short Term: Increase workloads from initial exercise prescription for resistance, speed, and METs.;Short Term: Perform resistance training exercises routinely during rehab and add in resistance training at home;Long Term: Improve cardiorespiratory fitness, muscular endurance and strength as measured by increased METs and functional capacity (6MWT) Short Term: Increase workloads from initial exercise prescription for resistance, speed, and METs.;Short Term: Perform resistance training exercises routinely during rehab and add in resistance training at home;Long Term: Improve cardiorespiratory fitness, muscular endurance and strength as measured by  increased METs and functional capacity (6MWT)    Able to understand and use rate of perceived exertion (RPE) scale Yes Yes Yes Yes    Intervention Provide education and explanation on how to use RPE scale Provide education and explanation on how to use RPE scale Provide education and explanation on how to use RPE scale Provide education and explanation on how to use RPE scale    Expected Outcomes Short Term: Able to use RPE daily in rehab to express subjective intensity level;Long Term:  Able to use RPE to guide intensity level when exercising independently Short Term: Able to use RPE daily in rehab to express subjective intensity level;Long Term:  Able to use RPE to guide intensity level when exercising independently Short Term: Able to use RPE daily in rehab to express subjective intensity level;Long Term:  Able to use RPE to guide intensity level when exercising independently Short Term: Able to use RPE daily in rehab to express subjective intensity level;Long Term:  Able to use RPE to guide intensity level when exercising independently    Knowledge and understanding of Target Heart Rate Range (THRR) Yes Yes Yes Yes    Intervention Provide education and explanation of THRR including how the numbers were predicted and where they are located for reference Provide education and explanation of THRR including how the numbers were predicted and where they are located for reference Provide education and explanation of THRR including how the numbers were predicted and where they are located for reference Provide education and explanation of THRR including how the numbers were predicted and where they are located for reference    Expected Outcomes Short Term: Able to state/look up THRR;Short Term: Able to use daily as guideline for intensity in rehab;Long Term: Able to use THRR to govern intensity when exercising independently Short Term: Able to state/look up THRR;Short Term: Able to use daily as guideline for  intensity in rehab;Long Term: Able to use THRR to govern intensity when exercising independently Short Term: Able to state/look up THRR;Short Term: Able to use daily as guideline for intensity in rehab;Long Term: Able to use THRR to govern intensity when exercising independently Short Term: Able to state/look up THRR;Short Term: Able to use daily as guideline for intensity in rehab;Long Term: Able to use THRR to govern intensity when exercising independently    Able to check pulse independently Yes Yes Yes Yes    Intervention  Provide education and demonstration on how to check pulse in carotid and radial arteries.;Review the importance of being able to check your own pulse for safety during independent exercise Provide education and demonstration on how to check pulse in carotid and radial arteries.;Review the importance of being able to check your own pulse for safety during independent exercise Provide education and demonstration on how to check pulse in carotid and radial arteries.;Review the importance of being able to check your own pulse for safety during independent exercise Provide education and demonstration on how to check pulse in carotid and radial arteries.;Review the importance of being able to check your own pulse for safety during independent exercise    Expected Outcomes Short Term: Able to explain why pulse checking is important during independent exercise;Long Term: Able to check pulse independently and accurately Short Term: Able to explain why pulse checking is important during independent exercise;Long Term: Able to check pulse independently and accurately Short Term: Able to explain why pulse checking is important during independent exercise;Long Term: Able to check pulse independently and accurately Short Term: Able to explain why pulse checking is important during independent exercise;Long Term: Able to check pulse independently and accurately    Understanding of Exercise Prescription  Yes Yes Yes Yes    Intervention Provide education, explanation, and written materials on patient's individual exercise prescription Provide education, explanation, and written materials on patient's individual exercise prescription Provide education, explanation, and written materials on patient's individual exercise prescription Provide education, explanation, and written materials on patient's individual exercise prescription    Expected Outcomes Short Term: Able to explain program exercise prescription;Long Term: Able to explain home exercise prescription to exercise independently Short Term: Able to explain program exercise prescription;Long Term: Able to explain home exercise prescription to exercise independently Short Term: Able to explain program exercise prescription;Long Term: Able to explain home exercise prescription to exercise independently Short Term: Able to explain program exercise prescription;Long Term: Able to explain home exercise prescription to exercise independently             Exercise Goals Re-Evaluation:  Exercise Goals Re-Evaluation     Row Name 11/23/21 1343 12/21/21 1524 01/19/22 1128         Exercise Goal Re-Evaluation   Exercise Goals Review Increase Physical Activity;Increase Strength and Stamina;Able to understand and use rate of perceived exertion (RPE) scale;Knowledge and understanding of Target Heart Rate Range (THRR);Able to check pulse independently;Understanding of Exercise Prescription Increase Physical Activity;Increase Strength and Stamina;Able to understand and use rate of perceived exertion (RPE) scale;Knowledge and understanding of Target Heart Rate Range (THRR);Able to check pulse independently;Understanding of Exercise Prescription Increase Physical Activity;Increase Strength and Stamina;Able to understand and use rate of perceived exertion (RPE) scale;Knowledge and understanding of Target Heart Rate Range (THRR);Able to check pulse  independently;Understanding of Exercise Prescription     Comments Pt has completed 6 sessions of cardiac rehab. He is going to progress slowly due to be a little deconditioned. He is motived to be in class and has enjoyed coming. He is currently exercising at 3.5 METs on the ellp. Will continue to monitor and progress as able. Pt has completed 13 session of cardiac rehab. He continues to progress slowly . He recently does not seem as motivated during class but is pushing through the class and talks to the other pts. He is currently exercising at 3.7 METs on the ellp. will continue to monitor and progress as able. Pt has completed 22 sessions of CR. He is progressing  slowly and is not wanting to increase levles. He does not seem motivated during class. He is currently exercising at 3.9 METs on the Ellp. Will continue to montior and progress as able.     Expected Outcomes Through exercise at rehab and home, the patient will meet their stated goals. Through exercise at rehab and home, the patient will meet their stated goals. Through exercise at rehab and home, the patient will meet their stated goals.              Nutrition & Weight - Outcomes:  Pre Biometrics - 11/10/21 0903       Pre Biometrics   Height '5\' 9"'$  (1.753 m)    Weight 143 lb 15.4 oz (65.3 kg)    Waist Circumference 34.5 inches    Hip Circumference 37 inches    Waist to Hip Ratio 0.93 %    BMI (Calculated) 21.25    Triceps Skinfold 14 mm    % Body Fat 22.8 %    Grip Strength 21.9 kg    Single Leg Stand 11.09 seconds             Post Biometrics - 02/05/22 1004        Post  Biometrics   Height '5\' 9"'$  (1.753 m)    Weight 143 lb 8.3 oz (65.1 kg)    Waist Circumference 33 inches    Hip Circumference 35.5 inches    Waist to Hip Ratio 0.93 %    BMI (Calculated) 21.18    Triceps Skinfold 10 mm    % Body Fat 20.7 %    Grip Strength 17.5 kg    Flexibility 0 in    Single Leg Stand 10.25 seconds              Nutrition:  Nutrition Therapy & Goals - 11/18/21 0919       Personal Nutrition Goals   Comments Patient scored 50 on his diet assessment. We offer 2 educational sessions on heart healthy nutrition with handouts and assistance with RD referral if patient is interested.      Intervention Plan   Intervention Nutrition handout(s) given to patient.    Expected Outcomes Short Term Goal: Understand basic principles of dietary content, such as calories, fat, sodium, cholesterol and nutrients.             Nutrition Discharge:  Nutrition Assessments - 02/08/22 1343       MEDFICTS Scores   Pre Score 50    Post Score 53    Score Difference 3             Education Questionnaire Score:  Knowledge Questionnaire Score - 02/08/22 1341       Knowledge Questionnaire Score   Pre Score 22/24    Post Score 20/24             Pt graduated from CR after 30 sessions. He was able to improve his walk test distance by 8.3%, and his MET level at discharge was 3.8. He reports that he will continue to exercise by walking at home when the weather permits. He will see cardiology soon to decide if he will go through with another coronary intervention.

## 2022-02-16 ENCOUNTER — Other Ambulatory Visit (HOSPITAL_COMMUNITY): Payer: Self-pay

## 2022-02-16 ENCOUNTER — Encounter (HOSPITAL_COMMUNITY): Payer: Self-pay

## 2022-02-16 ENCOUNTER — Ambulatory Visit (HOSPITAL_COMMUNITY)
Admission: RE | Admit: 2022-02-16 | Discharge: 2022-02-16 | Disposition: A | Discharge status: Home / Self Care | Payer: PPO | Source: Ambulatory Visit | Attending: Family Medicine | Admitting: Family Medicine

## 2022-02-16 VITALS — BP 150/90 | HR 73 | Wt 148.0 lb

## 2022-02-16 DIAGNOSIS — E1165 Type 2 diabetes mellitus with hyperglycemia: Secondary | ICD-10-CM | POA: Insufficient documentation

## 2022-02-16 DIAGNOSIS — Z79899 Other long term (current) drug therapy: Secondary | ICD-10-CM | POA: Insufficient documentation

## 2022-02-16 DIAGNOSIS — I5022 Chronic systolic (congestive) heart failure: Secondary | ICD-10-CM | POA: Insufficient documentation

## 2022-02-16 DIAGNOSIS — E1169 Type 2 diabetes mellitus with other specified complication: Secondary | ICD-10-CM | POA: Diagnosis not present

## 2022-02-16 DIAGNOSIS — I252 Old myocardial infarction: Secondary | ICD-10-CM | POA: Diagnosis not present

## 2022-02-16 DIAGNOSIS — Z7902 Long term (current) use of antithrombotics/antiplatelets: Secondary | ICD-10-CM | POA: Insufficient documentation

## 2022-02-16 DIAGNOSIS — I110 Hypertensive heart disease with heart failure: Secondary | ICD-10-CM | POA: Insufficient documentation

## 2022-02-16 DIAGNOSIS — Z7901 Long term (current) use of anticoagulants: Secondary | ICD-10-CM | POA: Insufficient documentation

## 2022-02-16 DIAGNOSIS — R627 Adult failure to thrive: Secondary | ICD-10-CM | POA: Insufficient documentation

## 2022-02-16 DIAGNOSIS — Z794 Long term (current) use of insulin: Secondary | ICD-10-CM

## 2022-02-16 DIAGNOSIS — I480 Paroxysmal atrial fibrillation: Secondary | ICD-10-CM | POA: Diagnosis not present

## 2022-02-16 DIAGNOSIS — R630 Anorexia: Secondary | ICD-10-CM | POA: Diagnosis not present

## 2022-02-16 DIAGNOSIS — I4892 Unspecified atrial flutter: Secondary | ICD-10-CM | POA: Diagnosis not present

## 2022-02-16 DIAGNOSIS — R29898 Other symptoms and signs involving the musculoskeletal system: Secondary | ICD-10-CM

## 2022-02-16 DIAGNOSIS — R1310 Dysphagia, unspecified: Secondary | ICD-10-CM | POA: Insufficient documentation

## 2022-02-16 DIAGNOSIS — E875 Hyperkalemia: Secondary | ICD-10-CM | POA: Diagnosis not present

## 2022-02-16 DIAGNOSIS — I2510 Atherosclerotic heart disease of native coronary artery without angina pectoris: Secondary | ICD-10-CM | POA: Diagnosis not present

## 2022-02-16 DIAGNOSIS — Z7984 Long term (current) use of oral hypoglycemic drugs: Secondary | ICD-10-CM | POA: Insufficient documentation

## 2022-02-16 DIAGNOSIS — E785 Hyperlipidemia, unspecified: Secondary | ICD-10-CM | POA: Diagnosis not present

## 2022-02-16 DIAGNOSIS — K219 Gastro-esophageal reflux disease without esophagitis: Secondary | ICD-10-CM

## 2022-02-16 DIAGNOSIS — Z955 Presence of coronary angioplasty implant and graft: Secondary | ICD-10-CM | POA: Insufficient documentation

## 2022-02-16 DIAGNOSIS — G4733 Obstructive sleep apnea (adult) (pediatric): Secondary | ICD-10-CM | POA: Insufficient documentation

## 2022-02-16 LAB — COMPREHENSIVE METABOLIC PANEL
ALT: 22 U/L (ref 0–44)
AST: 33 U/L (ref 15–41)
Albumin: 3.4 g/dL — ABNORMAL LOW (ref 3.5–5.0)
Alkaline Phosphatase: 86 U/L (ref 38–126)
Anion gap: 13 (ref 5–15)
BUN: 38 mg/dL — ABNORMAL HIGH (ref 8–23)
CO2: 24 mmol/L (ref 22–32)
Calcium: 9.2 mg/dL (ref 8.9–10.3)
Chloride: 98 mmol/L (ref 98–111)
Creatinine, Ser: 1.38 mg/dL — ABNORMAL HIGH (ref 0.61–1.24)
GFR, Estimated: 51 mL/min — ABNORMAL LOW (ref >60)
Glucose, Bld: 203 mg/dL — ABNORMAL HIGH (ref 70–99)
Potassium: 4.8 mmol/L (ref 3.5–5.1)
Sodium: 135 mmol/L (ref 135–145)
Total Bilirubin: 0.9 mg/dL (ref 0.3–1.2)
Total Protein: 6.5 g/dL (ref 6.5–8.1)

## 2022-02-16 LAB — IRON AND TIBC
Iron: 32 ug/dL — ABNORMAL LOW (ref 45–182)
Saturation Ratios: 11 % — ABNORMAL LOW (ref 17.9–39.5)
TIBC: 280 ug/dL (ref 250–450)
UIBC: 248 ug/dL

## 2022-02-16 LAB — CBC
HCT: 35 % — ABNORMAL LOW (ref 39.0–52.0)
Hemoglobin: 11.4 g/dL — ABNORMAL LOW (ref 13.0–17.0)
MCH: 31.5 pg (ref 26.0–34.0)
MCHC: 32.6 g/dL (ref 30.0–36.0)
MCV: 96.7 fL (ref 80.0–100.0)
Platelets: 289 10*3/uL (ref 150–400)
RBC: 3.62 MIL/uL — ABNORMAL LOW (ref 4.22–5.81)
RDW: 18.4 % — ABNORMAL HIGH (ref 11.5–15.5)
WBC: 6 10*3/uL (ref 4.0–10.5)
nRBC: 0 % (ref 0.0–0.2)

## 2022-02-16 LAB — FERRITIN: Ferritin: 64 ng/mL (ref 24–336)

## 2022-02-16 LAB — BRAIN NATRIURETIC PEPTIDE: B Natriuretic Peptide: 2457.3 pg/mL — ABNORMAL HIGH (ref 0.0–100.0)

## 2022-02-16 MED ORDER — AMIODARONE HCL 200 MG PO TABS: 100.0000 mg | ORAL_TABLET | Freq: Every day | ORAL | 8 refills | Status: DC

## 2022-02-16 MED ORDER — POTASSIUM CHLORIDE CRYS ER 20 MEQ PO TBCR: 20.0000 meq | EXTENDED_RELEASE_TABLET | ORAL | 0 refills | Status: DC | PRN

## 2022-02-16 MED ORDER — DAPAGLIFLOZIN PROPANEDIOL 10 MG PO TABS: 10.0000 mg | ORAL_TABLET | Freq: Every day | ORAL | 8 refills | Status: DC

## 2022-02-16 NOTE — Progress Notes (Signed)
ReDS Vest / Clip - 02/16/22 1515       ReDS Vest / Clip   Station Marker C    Ruler Value 37    ReDS Value Range Moderate volume overload    ReDS Actual Value 40    Anatomical Comments sitting

## 2022-02-16 NOTE — Progress Notes (Signed)
ADVANCED HF CLINIC NOTE   PCP: Tommy Bis, MD Sleep Cardiology: Dr Tommy Graham  HF Cardiologist: Dr Tommy Graham  HPI: 82 y.o. male with hx uncontrolled DM II, HTN, HLD. Admitted 09/17/21 with late presenting inferolateral STEMI with RV involvement c/b cardiogenic shock in setting of RV failure. Found to have proximal occlusion of RCA and 75% d LM/ostial LAD. Underwent emergent PCI/DES to RCA. Staged PCI LM/LAD to be planned at a later date. EF ~ 45%.    Hemodynamics c/w low-output RV failure. Initially on Milrinone for inotrope support, later switched to DBA/NE d/t persistent low-output. Developed AKI and shock liver. Renal function and LFTs were followed closely and improved with hemodynamic support. He was eventually weaned off inotropes and maintained stable CO-OX. GDMT titrated as tolerated. Course c/b paroxysmal AF. Loaded with IV amio, in and out of a fib. Rate controlled.  Amio loading to be continued after discharge.  Zio 10/05/21 io 29 % A flutter.   He was seen in the HF clinic 10/05/21 and was feeling bad due to fatigue. K 6.3, SCr 1.5. Dig , spiro, and losartan stopped. Given Lokelma.    Follow up 11/23, NYHA III and volume ok. Main issue was FTT, GI working up. Called cardiology after hours 12/23 with c/o SOB and leg swelling. Advised increase Lasix dose for a few days then use PRN.  Today he returns for an acute visit with his family (wife and son, who is an Therapist, sports). He has had to take his PRN lasix more frequently, requiring every 4-5 days for swelling and SOB. Just finished with [facility] at [facility]. He had palpitations and increased his amiodarone back to 200 mg daily. Overall feeling fair. He is limited mainly by fatigue. Denies abnormal bleeding, CP, dizziness, or PND/Orthopnea. Appetite fair but appetite is still off and taste remains altered. No fever or chills. Weight at home 140-145 pounds. Taking all medications. Had GI issues with nausea and food getting stuck. Saw GI last year but  felt too early after MI to scope. No longer having dark stools, has GI visit tomorrow. Wears compression hose.    ROS: All systems negative except as listed in HPI, PMH and Problem List.  SH:  Social History   Socioeconomic History   Marital status: Married    Spouse name: Not on file   Number of children: Not on file   Years of education: Not on file   Highest education level: Not on file  Occupational History   Not on file  Tobacco Use   Smoking status: Never   Smokeless tobacco: Never  Vaping Use   Vaping Use: Never used  Substance and Sexual Activity   Alcohol use: Never   Drug use: Never   Sexual activity: Not on file  Other Topics Concern   Not on file  Social History Narrative   Not on file   Social Determinants of Health   Financial Resource Strain: Not on file  Food Insecurity: No Food Insecurity (09/23/2021)   Hunger Vital Sign    Worried About Running Out of Food in the Last Year: Never true    Ran Out of Food in the Last Year: Never true  Transportation Needs: No Transportation Needs (09/23/2021)   PRAPARE - Hydrologist (Medical): No    Lack of Transportation (Non-Medical): No  Physical Activity: Not on file  Stress: Not on file  Social Connections: Not on file  Intimate Partner Violence: Not At Risk (  09/23/2021)   Humiliation, Afraid, Rape, and Kick questionnaire    Fear of Current or Ex-Partner: No    Emotionally Abused: No    Physically Abused: No    Sexually Abused: No    FH: No family history on file.  Past Medical History:  Diagnosis Date   Cancer [facility])    prostate   Diabetes mellitus without complication (HCC)    Hypertension     Current Outpatient Medications  Medication Sig Dispense Refill   amiodarone (PACERONE) 200 MG tablet Take 0.5 tablets (100 mg total) by mouth daily. (Patient taking differently: Take 200 mg by mouth daily.) 30 tablet 6   apixaban (ELIQUIS) 5 MG TABS tablet Take 1 tablet (5 mg total) by  mouth 2 (two) times daily. 60 tablet 5   clopidogrel (PLAVIX) 75 MG tablet Take 1 tablet (75 mg total) by mouth daily. 30 tablet 5   dorzolamide (TRUSOPT) 2 % ophthalmic solution Place 1 drop into both eyes in the morning.     DULoxetine (CYMBALTA) 30 MG capsule Take 30 mg by mouth in the morning.     furosemide (LASIX) 40 MG tablet Take 1 tablet (40 mg total) by mouth daily as needed (weight gain (3lbs in 1 day or 5lbs in 1 week) or worsening lower extremity swelling or shortness of breath). 30 tablet 1   insulin glargine (LANTUS) 100 UNIT/ML injection Inject 8-20 Units into the skin at bedtime.     latanoprost (XALATAN) 0.005 % ophthalmic solution Place 1 drop into both eyes at bedtime.     metFORMIN (GLUCOPHAGE) 850 MG tablet Take 850 mg by mouth in the morning and at bedtime. (Morning & afternoon)     propranolol (INDERAL) 10 MG tablet Take 10 mg by mouth 2 (two) times daily.     ondansetron (ZOFRAN) 8 MG tablet Take 8 mg by mouth daily in the afternoon. (Patient not taking: Reported on 02/16/2022)     pantoprazole (PROTONIX) 40 MG tablet Take 1 tablet (40 mg total) by mouth 2 (two) times daily. (Patient not taking: Reported on 02/16/2022) 90 tablet 3   No current facility-administered medications for this encounter.   BP (!) 150/90   Pulse 73   Wt 67.1 kg (148 lb)   SpO2 99%   BMI 21.86 kg/m   Wt Readings from Last 3 Encounters:  02/16/22 67.1 kg (148 lb)  02/05/22 65.1 kg (143 lb 8.3 oz)  12/16/21 66.2 kg (146 lb)   PHYSICAL EXAM: General:  NAD. No resp difficulty, thin, elderly fatigued appearing HEENT: + HOH Neck: Supple. JVP 8-9. Carotids 2+ bilat; no bruits. No lymphadenopathy or thryomegaly appreciated. Cor: PMI nondisplaced. Regular rate & rhythm. No rubs, gallops or murmurs. Lungs: Clear, faint crackles in RLL Abdomen: Soft, nontender, nondistended. No hepatosplenomegaly. No bruits or masses. Good bowel sounds. Extremities: No cyanosis, clubbing, rash; 2-3+ BLE edema to  knees Neuro: Alert & oriented x 3, cranial nerves grossly intact. Moves all 4 extremities w/o difficulty. Affect pleasant. + hand tremor  ReDs: 40%  ECG (personally reviewed): NSR 73 bpm  ASSESSMENT & PLAN:   1. Chronic systolic CHF/iCM - Cardiogenic shock 8/23 due primarily to RV infarct in setting of late-presenting RCA STEMI. Required inotrope support with DBA. - Echo (9/23): EF 30-35% RV moderately reduced trivial MR - NYHA III-IIIb, fatigue main limiter. Volume up today, ReDs 40%. - Start Farxiga 10 mg daily.  - Take Lasix 40 mg daily x 3 days, then use PRN.  - Give Rx  for KCL 20 PRN, await labs today. - On propanolol 10 mg bid for hx tremor. - No spiro/Entresto with recent hyperK - Labs today, including iron studies. - Repeat echo. - Wear compression hose.  2. 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 - No current angina - For now will delay PCI LM/LAD given significant non-cardiac issues. LM disease is not critical.  3. Failure to thrive/anorexia/dysphagia - Severe. Concern for stricture/ No improvement with PPI - Has seen GI and suggested EGD but waiting for timing after MI. - He is stable enough for EGD and ok to stop Eliquis but ideally would wait 6 months to stop Plavix. - Can proceed with EGD on plavix for diagnostic purposes only. If intervention need for critical issue can likely stop Plavix for a few days - UGI has been arranged. - Continue Ensure supplements - Decrease amiodarone to 100 mg daily. - He has GI follow up this week with [facility].  4. DM2, poorly controlled - a1c 9.5% - On metformin and insulin - Planning to establish with Endocrine - Starting Wilder Glade as above, will notify PCP.  5. PAF/AFL - in and out since recent admit, - 3 day zio monitor (9/23). AFL burden 29%. - Continue amiodarone 200 mg daily - NSR on ECG  today. - Decrease amiodarone to 100 mg daily. - Continue Eliquis 5 mg bid.  - Check LFTs today  6. Hyperkalemia  - K has been running > 5 - BMET today.  7. OSA - Unable to wear CPAP. May be contributing to fatigue. - In the past he saw Dr Tommy Graham.  - Encouraged he try to wear CPAP.  8. Leg weakness - Now off statin. - Check CKs  Follow up in 2 months with Dr. Haroldine Graham, as scheduled.  Tommy Bo Andraya Frigon FNP-BC 3:18 PM

## 2022-02-16 NOTE — Patient Instructions (Addendum)
Thank you for coming in today  Labs were done today, if any labs are abnormal the clinic will call you No news is good news  START Farxiga 10 mg 1 tablet daily DECREASE Amiodarone to 100 mg 1/2 tablet daily   TAKE Lasix 40 mg daily for 3 days and then take as needed For weight gain of 3 lbs in 24 hours or 5 lbs in a week  Your physician has requested that you have an echocardiogram. Echocardiography is a painless test that uses sound waves to create images of your heart. It provides your doctor with information about the size and shape of your heart and how well your heart's chambers and valves are working. This procedure takes approximately one hour. There are no restrictions for this procedure.   Your physician recommends that you schedule a follow-up appointment in:  Keep follow up with Dr. Haroldine Laws   Do the following things EVERYDAY: Weigh yourself in the morning before breakfast. Write it down and keep it in a log. Take your medicines as prescribed Eat low salt foods--Limit salt (sodium) to 2000 mg per day.  Stay as active as you can everyday Limit all fluids for the day to less than 2 liters  At the [facility], you and your health needs are our priority. As part of our continuing mission to provide you with exceptional heart care, we have created designated Provider Care Teams. These Care Teams include your primary Cardiologist (physician) and Advanced Practice Providers (APPs- Physician Assistants and Nurse Practitioners) who all work together to provide you with the care you need, when you need it.   You may see any of the following providers on your designated Care Team at your next follow up: Dr Glori Bickers Dr Loralie Champagne Dr. Roxana Hires, NP Lyda Jester, [place] [facility] [place], [place] Forestine Na, NP Audry Riles, PharmD   Please be sure to bring in all your medications bottles to every appointment.    Thank you  for choosing [facility]   If you have any questions or concerns before your next appointment please send [place] a message through [place] or call our office at [phone].    TO LEAVE A MESSAGE FOR THE NURSE SELECT OPTION 2, PLEASE LEAVE A MESSAGE INCLUDING: YOUR NAME DATE OF BIRTH CALL BACK NUMBER REASON FOR CALL**this is important as we prioritize the call backs  YOU WILL RECEIVE A CALL BACK THE SAME DAY AS LONG AS YOU CALL BEFORE 4:00 PM

## 2022-02-17 ENCOUNTER — Other Ambulatory Visit (HOSPITAL_COMMUNITY): Payer: Self-pay | Admitting: Physician Assistant

## 2022-02-17 DIAGNOSIS — R1310 Dysphagia, unspecified: Secondary | ICD-10-CM | POA: Diagnosis not present

## 2022-02-17 DIAGNOSIS — I2510 Atherosclerotic heart disease of native coronary artery without angina pectoris: Secondary | ICD-10-CM | POA: Diagnosis not present

## 2022-02-17 DIAGNOSIS — K219 Gastro-esophageal reflux disease without esophagitis: Secondary | ICD-10-CM | POA: Diagnosis not present

## 2022-02-17 DIAGNOSIS — I509 Heart failure, unspecified: Secondary | ICD-10-CM | POA: Diagnosis not present

## 2022-02-19 ENCOUNTER — Ambulatory Visit (HOSPITAL_COMMUNITY)
Admission: RE | Admit: 2022-02-19 | Discharge: 2022-02-19 | Disposition: A | Discharge status: Home / Self Care | Payer: PPO | Source: Ambulatory Visit | Attending: Physician Assistant | Admitting: Physician Assistant

## 2022-02-19 DIAGNOSIS — R1310 Dysphagia, unspecified: Secondary | ICD-10-CM | POA: Diagnosis not present

## 2022-02-22 ENCOUNTER — Telehealth (HOSPITAL_COMMUNITY): Payer: Self-pay

## 2022-02-22 NOTE — Telephone Encounter (Signed)
Received OP Modified Barium Swallow order from Deliah Goody, [place] at [facility]. Contacted patient to schedule and patient would like to be scheduled at [facility]. Faxed over order to [facility].

## 2022-02-23 ENCOUNTER — Other Ambulatory Visit (HOSPITAL_COMMUNITY): Payer: Self-pay | Admitting: *Deleted

## 2022-02-23 ENCOUNTER — Other Ambulatory Visit (HOSPITAL_COMMUNITY): Payer: Self-pay | Admitting: Occupational Therapy

## 2022-02-23 ENCOUNTER — Telehealth (HOSPITAL_COMMUNITY): Payer: Self-pay | Admitting: Cardiology

## 2022-02-23 DIAGNOSIS — I5022 Chronic systolic (congestive) heart failure: Secondary | ICD-10-CM

## 2022-02-23 DIAGNOSIS — K219 Gastro-esophageal reflux disease without esophagitis: Secondary | ICD-10-CM

## 2022-02-23 DIAGNOSIS — E611 Iron deficiency: Secondary | ICD-10-CM

## 2022-02-23 DIAGNOSIS — R1312 Dysphagia, oropharyngeal phase: Secondary | ICD-10-CM

## 2022-02-23 NOTE — Telephone Encounter (Addendum)
Per GI  He would be at high risk for EGD with esophageal dilation given his current health status and comorbidities. Scheduling barium swallow tablet for further evaluation. If there is a large stricture or esophageal mass, consider EGD in hospital. He would need cardiac clearance and permission to hold blood thinners. Currently on Plavix and Eliquis. All this was discussed with patient and his wife. They expressed understanding. Risks of EGD were discussed including esophageal perforation, bleeding, and risk of sedation.    Will forward to provider Office to send faxed request as well

## 2022-02-23 NOTE — Progress Notes (Signed)
Iron orders placed per Allena Katz, NP

## 2022-02-25 ENCOUNTER — Other Ambulatory Visit (HOSPITAL_COMMUNITY): Payer: Self-pay

## 2022-02-25 DIAGNOSIS — E611 Iron deficiency: Secondary | ICD-10-CM

## 2022-03-02 ENCOUNTER — Telehealth (HOSPITAL_COMMUNITY): Payer: Self-pay

## 2022-03-02 NOTE — Telephone Encounter (Signed)
Shaun, his son, called and asked about iron infusion. He has not heard anything about an appointment. Can you follow up?

## 2022-03-04 ENCOUNTER — Encounter (HOSPITAL_COMMUNITY): Payer: Self-pay | Admitting: Speech Pathology

## 2022-03-04 ENCOUNTER — Ambulatory Visit (HOSPITAL_COMMUNITY)
Admission: RE | Admit: 2022-03-04 | Discharge: 2022-03-04 | Disposition: A | Discharge status: Home / Self Care | Payer: PPO | Source: Ambulatory Visit | Attending: Physician Assistant | Admitting: Physician Assistant

## 2022-03-04 ENCOUNTER — Ambulatory Visit (HOSPITAL_COMMUNITY): Payer: PPO | Attending: Family Medicine | Admitting: Speech Pathology

## 2022-03-04 ENCOUNTER — Other Ambulatory Visit (HOSPITAL_COMMUNITY): Payer: Self-pay

## 2022-03-04 DIAGNOSIS — R1312 Dysphagia, oropharyngeal phase: Secondary | ICD-10-CM | POA: Insufficient documentation

## 2022-03-04 DIAGNOSIS — R1310 Dysphagia, unspecified: Secondary | ICD-10-CM | POA: Diagnosis not present

## 2022-03-04 DIAGNOSIS — K219 Gastro-esophageal reflux disease without esophagitis: Secondary | ICD-10-CM | POA: Diagnosis not present

## 2022-03-04 DIAGNOSIS — E611 Iron deficiency: Secondary | ICD-10-CM

## 2022-03-04 DIAGNOSIS — R059 Cough, unspecified: Secondary | ICD-10-CM | POA: Diagnosis not present

## 2022-03-04 NOTE — Addendum Note (Signed)
Addended by: Scarlette Calico on: 03/04/2022 03:26 PM   Modules accepted: Orders

## 2022-03-04 NOTE — Therapy (Signed)
[facility] at [place], [place], [address] Phone: [phone]   Fax:  [phone]  Modified Barium Swallow  Patient Details  Name: Tommy [facility] MRN: [id number] Date of Birth: [birth year] No data recorded  Encounter Date: 03/04/2022   End of Session - 03/04/22 1938     Visit Number 1    Number of Visits 1    Authorization Type [employer]    SLP Start Time 1140    SLP Stop Time  1215    SLP Time Calculation (min) 35 min    Activity Tolerance Patient tolerated treatment well             Past Medical History:  Diagnosis Date   Cancer ([place])    prostate   Diabetes mellitus without complication ([place])    Hypertension     Past Surgical History:  Procedure Laterality Date   CERVICAL SPINE SURGERY     CORONARY STENT INTERVENTION N/A 09/17/2021   Procedure: CORONARY STENT INTERVENTION;  Surgeon: Sherren Mocha, MD;  Location: [facility];  Service: Cardiovascular;  Laterality: N/A;   LEFT HEART CATH AND CORONARY ANGIOGRAPHY N/A 09/17/2021   Procedure: LEFT HEART CATH AND CORONARY ANGIOGRAPHY;  Surgeon: Sherren Mocha, MD;  Location: [facility];  Service: Cardiovascular;  Laterality: N/A;   PROSTATE SURGERY     RIGHT HEART CATH N/A 09/17/2021   Procedure: RIGHT HEART CATH;  Surgeon: Sherren Mocha, MD;  Location: [facility];  Service: Cardiovascular;  Laterality: N/A;   ROTATOR CUFF REPAIR Bilateral     There were no vitals filed for this visit.        General - 03/04/22 1322       General Information   Date of Onset 02/17/22    HPI 82 y.o. male with hx uncontrolled DM II, HTN, HLD. Admitted 09/17/21 with late presenting inferolateral STEMI with RV involvement c/b cardiogenic shock in setting of RV failure. Found to have proximal occlusion of RCA and 75% d LM/ostial LAD. Underwent emergent PCI/DES to RCA. Staged PCI LM/LAD to be planned at a later date. EF ~ 45%.     Type of Study MBS-Modified Barium Swallow Study    Previous Swallow Assessment BPE    Diet Prior to this Study Regular;Thin liquids (Level 0)    Temperature Spikes Noted No    Respiratory Status Room air    History of Recent Intubation No    Behavior/Cognition Alert;Cooperative;Pleasant mood    Oral Cavity: Oral Hygiene Within Functional Limits    Oral Care Completed by SLP No    Oral Cavity - Dentition Adequate natural dentition    Vision Functional for self feeding    Self-Feeding Abilities Able to feed self    Patient Positioning Upright in chair    Baseline Vocal Quality Normal;Low vocal intensity    Volitional Cough Strong    Volitional Swallow Able to elicit    Anatomy Other (Comment)   cervical hardware   Pharyngeal Secretions Not observed secondary MBS                Oral Preparation/Oral Phase - 03/04/22 1910       Oral Preparation/Oral Phase   Oral Phase Impaired      Oral - Solids   Oral - Regular Imparied mastication;Piecemeal swallowing;Delayed A-P transit;Decreased bolus cohesion;Oral residue    Oral - Pill Delayed A-P transit      Electrical stimulation - Oral Phase  Was Electrical Stimulation Used No              Pharyngeal Phase - 03/04/22 1916       Pharyngeal Phase   Pharyngeal Phase Impaired      Pharyngeal - Honey   Pharyngeal- Honey Teaspoon Swallow initiation at vallecula;Reduced tongue base retraction;Reduced epiglottic inversion;Pharyngeal residue - valleculae;Pharyngeal residue - pyriform      Pharyngeal - Nectar   Pharyngeal- Nectar Teaspoon Swallow initiation at vallecula;Reduced epiglottic inversion;Reduced tongue base retraction;Pharyngeal residue - valleculae    Pharyngeal- Nectar Cup Reduced epiglottic inversion;Reduced tongue base retraction;Pharyngeal residue - valleculae      Pharyngeal - Thin   Pharyngeal- Thin Teaspoon Swallow initiation at vallecula;Reduced epiglottic inversion;Reduced tongue base retraction;Pharyngeal  residue - valleculae    Pharyngeal- Thin Cup Swallow initiation at vallecula;Reduced epiglottic inversion;Reduced tongue base retraction;Penetration/Apiration after swallow;Pharyngeal residue - valleculae;Pharyngeal residue - pyriform    Pharyngeal Material enters airway, remains ABOVE vocal cords then ejected out;Material does not enter airway    Pharyngeal- Thin Straw Not tested      Pharyngeal - Solids   Pharyngeal- Puree Swallow initiation at vallecula;Reduced epiglottic inversion;Reduced tongue base retraction    Pharyngeal- Regular Delayed swallow initiation-vallecula;Reduced epiglottic inversion;Reduced tongue base retraction;Pharyngeal residue - valleculae    Pharyngeal- Pill --   tried first with water, penetration thin, then in puree [facility]     Electrical Stimulation - Pharyngeal Phase   Was Electrical Stimulation Used No              Cricopharyngeal Phase - 03/04/22 1938       Cervical Esophageal Phase   Cervical Esophageal Phase Impaired      Cervical Esophageal Phase - Thin   Thin Cup Prominent cricopharyngeal segment              Plan - 03/04/22 1939     Clinical Impression Statement Pt presents with min oropharyngeal phase dysphagia characterized by min impaired lingual movement with liquids with min delay initiation of tongue movement, with swallow trigger generally at the level of the valleculae, and reduced tongue base retraction resulting in min oral and mi/mod base of tongue and vallecular residue across consistencies and textures (least vallecular residue with puree). Pt with trace penetration of thins with sequential cup sip and when attempting to swallow the barium tablet with thins (to the level of the vocal folds and removed). He expectorated the pill and took with puree. Pharyngeal transit was noted to be greater on the right side when Pt was turned AP. Pt's primary deficit appears to be lingual weakness and reduced tongue base retraction with resulting  vallecular residue. He is able to clear the residuals with repeat/dry swallows. Recommend self regulated regular textures (soft and moist solids will be easier to masticate and swallow) with thin liquids with pills whole in puree and follow with liquid wash. Pt may benefit from short term SLP dysphagia therapy to work on lingual strengthening to facilitate pharyngeal clearance. Pt reports that he is willing to try therapy.    Speech Therapy Frequency 1x /week    Duration 4 weeks    Treatment/Interventions Aspiration precaution training;SLP instruction and feedback;Patient/family education;Pharyngeal strengthening exercises    Potential to Achieve Goals Good    Potential Considerations Previous level of function    Consulted and Agree with Plan of Care Patient             Patient will benefit from skilled therapeutic intervention in order to improve the following deficits  and impairments:   Dysphagia, oropharyngeal phase        Problem List Patient Active Problem List   Diagnosis Date Noted   Basal cell carcinoma (BCC) of skin of nose 11/04/2021   Gastroesophageal reflux disease 10/29/2021   Dysphagia 10/29/2021   SOB (shortness of breath) 10/05/2021   STEMI involving right coronary artery ([place]) 09/17/2021   Lumbar radiculopathy 07/16/2021   Low back pain 07/01/2021   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/02/2020   Cervical spine pain 07/09/2019   DDD (degenerative disc disease), cervical 07/09/2019   Pain in joint of left shoulder 06/28/2018   Acquired trigger finger of left middle finger 06/28/2018   Pain in finger of left hand 06/28/2018   Erectile dysfunction after radical prostatectomy 06/03/2017   Malignant neoplasm of prostate ([place]) 04/21/2015   DIABETES MELLITUS, TYPE II 09/03/2006   HYPERTENSION 09/03/2006   Thank you,  Genene Churn, [place]  Laurena Valko, [place] 03/04/2022, 7:41 PM  [facility] [place], [place], [address] Phone: [phone]   Fax:  [phone]  Name: COLBIN SHERBURN MRN: [id number] Date of Birth: 16-Nov-1940

## 2022-03-04 NOTE — Telephone Encounter (Signed)
Patient scheduled for 2/27 at 12pm. Patients son advised and verbalized understanding

## 2022-03-11 ENCOUNTER — Ambulatory Visit (HOSPITAL_COMMUNITY): Payer: PPO | Admitting: Speech Pathology

## 2022-03-11 ENCOUNTER — Encounter (HOSPITAL_COMMUNITY): Payer: Self-pay | Admitting: Speech Pathology

## 2022-03-11 DIAGNOSIS — R1312 Dysphagia, oropharyngeal phase: Secondary | ICD-10-CM

## 2022-03-11 NOTE — Therapy (Signed)
OUTPATIENT SPEECH LANGUAGE PATHOLOGY TREATMENT NOTE   Patient Name: Tommy Graham MRN: [id number] DOB:[birth year], 82 y.o., male Today's Date: 03/11/2022  PCP: Caryl Bis, MD REFERRING PROVIDER: Deliah Goody, PA-C  END OF SESSION:   End of Session - 03/11/22 1655     Visit Number 2    Number of Visits 5    Authorization Type [employer]    SLP Start Time 0945    SLP Stop Time  1030    SLP Time Calculation (min) 45 min    Activity Tolerance Patient tolerated treatment well             Past Medical History:  Diagnosis Date   Cancer ([place])    prostate   Diabetes mellitus without complication ([place])    Hypertension    Past Surgical History:  Procedure Laterality Date   CERVICAL SPINE SURGERY     CORONARY STENT INTERVENTION N/A 09/17/2021   Procedure: CORONARY STENT INTERVENTION;  Surgeon: Sherren Mocha, MD;  Location: [facility];  Service: Cardiovascular;  Laterality: N/A;   LEFT HEART CATH AND CORONARY ANGIOGRAPHY N/A 09/17/2021   Procedure: LEFT HEART CATH AND CORONARY ANGIOGRAPHY;  Surgeon: Sherren Mocha, MD;  Location: [facility];  Service: Cardiovascular;  Laterality: N/A;   PROSTATE SURGERY     RIGHT HEART CATH N/A 09/17/2021   Procedure: RIGHT HEART CATH;  Surgeon: Sherren Mocha, MD;  Location: [facility];  Service: Cardiovascular;  Laterality: N/A;   ROTATOR CUFF REPAIR Bilateral    Patient Active Problem List   Diagnosis Date Noted   Basal cell carcinoma (BCC) of skin of nose 11/04/2021   Gastroesophageal reflux disease 10/29/2021   Dysphagia 10/29/2021   SOB (shortness of breath) 10/05/2021   STEMI involving right coronary artery (HCC) 09/17/2021   Lumbar radiculopathy 07/16/2021   Low back pain 07/01/2021   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/02/2020   Cervical spine pain 07/09/2019   DDD (degenerative disc disease), cervical 07/09/2019   Pain in joint of left shoulder 06/28/2018   Acquired trigger finger of left middle  finger 06/28/2018   Pain in finger of left hand 06/28/2018   Erectile dysfunction after radical prostatectomy 06/03/2017   Malignant neoplasm of prostate ([place]) 04/21/2015   DIABETES MELLITUS, TYPE II 09/03/2006   HYPERTENSION 09/03/2006    ONSET DATE: 02/17/2022  REFERRING DIAG: Dysphagia  THERAPY DIAG:  Dysphagia, oropharyngeal phase  Rationale for Evaluation and Treatment: Rehabilitation  Tommy Graham is an 82 y.o. male with hx uncontrolled DM II, HTN, HLD. Admitted 09/17/21 with late presenting inferolateral STEMI with RV involvement c/b cardiogenic shock in setting of RV failure. Found to have proximal occlusion of RCA and 75% d LM/ostial LAD. Underwent emergent PCI/DES to RCA. Staged PCI LM/LAD to be planned at a later date. EF ~ 45%. He was referred for MBSS by Deliah Goody, PA (GI) and then follow dysphagia therapy.  MBSS: Pt presents with min oropharyngeal phase dysphagia characterized by min impaired lingual movement with liquids with min delay initiation of tongue movement, with swallow trigger generally at the level of the valleculae, and reduced tongue base retraction resulting in min oral and mi/mod base of tongue and vallecular residue across consistencies and textures (least vallecular residue with puree). Pt with trace penetration of thins with sequential cup sip and when attempting to swallow the barium tablet with thins (to the level of the vocal folds and removed). He expectorated the pill and took with puree. Pharyngeal transit was  noted to be greater on the right side when Pt was turned AP. Pt's primary deficit appears to be lingual weakness and reduced tongue base retraction with resulting vallecular residue. He is able to clear the residuals with repeat/dry swallows. Recommend self regulated regular textures (soft and moist solids will be easier to masticate and swallow) with thin liquids with pills whole in puree and follow with liquid wash. Pt may benefit from short term  SLP dysphagia therapy to work on lingual strengthening to facilitate pharyngeal clearance. Pt reports that he is willing to try therapy.   SUBJECTIVE:   SUBJECTIVE STATEMENT: "My swallowing feels a little bit better."  PAIN:  Are you having pain? No    OBJECTIVE:   TODAY'S TREATMENT:    Pt seen for dysphagia therapy following MBSS completed                                                                                                                                      DATE: 03/11/22    Thank you,  Genene Churn, [place]  [place], [place] 03/11/2022, 4:57 PM

## 2022-03-16 ENCOUNTER — Encounter (HOSPITAL_COMMUNITY)
Admission: RE | Admit: 2022-03-16 | Discharge: 2022-03-16 | Disposition: A | Discharge status: Home / Self Care | Payer: PPO | Source: Ambulatory Visit | Attending: Cardiovascular Disease | Admitting: Cardiovascular Disease

## 2022-03-16 ENCOUNTER — Ambulatory Visit (HOSPITAL_COMMUNITY)
Admission: RE | Admit: 2022-03-16 | Discharge: 2022-03-16 | Disposition: A | Discharge status: Home / Self Care | Payer: PPO | Source: Ambulatory Visit | Attending: Family Medicine | Admitting: Family Medicine

## 2022-03-16 DIAGNOSIS — I110 Hypertensive heart disease with heart failure: Secondary | ICD-10-CM | POA: Diagnosis not present

## 2022-03-16 DIAGNOSIS — E119 Type 2 diabetes mellitus without complications: Secondary | ICD-10-CM | POA: Diagnosis not present

## 2022-03-16 DIAGNOSIS — I5022 Chronic systolic (congestive) heart failure: Secondary | ICD-10-CM | POA: Insufficient documentation

## 2022-03-16 DIAGNOSIS — E611 Iron deficiency: Secondary | ICD-10-CM | POA: Insufficient documentation

## 2022-03-16 DIAGNOSIS — I081 Rheumatic disorders of both mitral and tricuspid valves: Secondary | ICD-10-CM | POA: Insufficient documentation

## 2022-03-16 MED ORDER — SODIUM CHLORIDE 0.9 % IV SOLN
510.0000 mg | Freq: Once | INTRAVENOUS | Status: AC
  Administered 2022-03-16: 510 mg via INTRAVENOUS
  Filled 2022-03-16: qty 17

## 2022-03-17 LAB — ECHOCARDIOGRAM COMPLETE
Area-P 1/2: 4.31 cm2
S' Lateral: 4.2 cm

## 2022-03-18 ENCOUNTER — Encounter (HOSPITAL_COMMUNITY): Payer: Self-pay | Admitting: Speech Pathology

## 2022-03-18 ENCOUNTER — Ambulatory Visit (HOSPITAL_COMMUNITY): Payer: PPO | Admitting: Speech Pathology

## 2022-03-18 DIAGNOSIS — R1312 Dysphagia, oropharyngeal phase: Secondary | ICD-10-CM

## 2022-03-18 NOTE — Therapy (Signed)
OUTPATIENT SPEECH LANGUAGE PATHOLOGY TREATMENT NOTE   Patient Name: Tommy Graham MRN: [id number] DOB:[birth year], 82 y.o., male Today's Date: 03/18/2022  PCP: Caryl Bis, MD REFERRING PROVIDER: Deliah Goody, PA-C  END OF SESSION:   End of Session - 03/18/22 1356     Visit Number 3    Number of Visits 5    Authorization Type [employer]    SLP Start Time 1030    SLP Stop Time  1115    SLP Time Calculation (min) 45 min    Activity Tolerance Patient tolerated treatment well             Past Medical History:  Diagnosis Date   Cancer ([place])    prostate   Diabetes mellitus without complication ([place])    Hypertension    Past Surgical History:  Procedure Laterality Date   CERVICAL SPINE SURGERY     CORONARY STENT INTERVENTION N/A 09/17/2021   Procedure: CORONARY STENT INTERVENTION;  Surgeon: Sherren Mocha, MD;  Location: [facility];  Service: Cardiovascular;  Laterality: N/A;   LEFT HEART CATH AND CORONARY ANGIOGRAPHY N/A 09/17/2021   Procedure: LEFT HEART CATH AND CORONARY ANGIOGRAPHY;  Surgeon: Sherren Mocha, MD;  Location: [facility];  Service: Cardiovascular;  Laterality: N/A;   PROSTATE SURGERY     RIGHT HEART CATH N/A 09/17/2021   Procedure: RIGHT HEART CATH;  Surgeon: Sherren Mocha, MD;  Location: [facility];  Service: Cardiovascular;  Laterality: N/A;   ROTATOR CUFF REPAIR Bilateral    Patient Active Problem List   Diagnosis Date Noted   Basal cell carcinoma (BCC) of skin of nose 11/04/2021   Gastroesophageal reflux disease 10/29/2021   Dysphagia 10/29/2021   SOB (shortness of breath) 10/05/2021   STEMI involving right coronary artery (HCC) 09/17/2021   Lumbar radiculopathy 07/16/2021   Low back pain 07/01/2021   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/02/2020   Cervical spine pain 07/09/2019   DDD (degenerative disc disease), cervical 07/09/2019   Pain in joint of left shoulder 06/28/2018   Acquired trigger finger of left middle  finger 06/28/2018   Pain in finger of left hand 06/28/2018   Erectile dysfunction after radical prostatectomy 06/03/2017   Malignant neoplasm of prostate ([place]) 04/21/2015   DIABETES MELLITUS, TYPE II 09/03/2006   HYPERTENSION 09/03/2006    ONSET DATE: 02/17/2022  REFERRING DIAG: Dysphagia  THERAPY DIAG:  Dysphagia, oropharyngeal phase  Rationale for Evaluation and Treatment: Rehabilitation  Tommy Graham is an 82 y.o. male with hx uncontrolled DM II, HTN, HLD. Admitted 09/17/21 with late presenting inferolateral STEMI with RV involvement c/b cardiogenic shock in setting of RV failure. Found to have proximal occlusion of RCA and 75% d LM/ostial LAD. Underwent emergent PCI/DES to RCA. Staged PCI LM/LAD to be planned at a later date. EF ~ 45%. He was referred for MBSS by Deliah Goody, PA (GI) and then follow dysphagia therapy.  MBSS: Pt presents with min oropharyngeal phase dysphagia characterized by min impaired lingual movement with liquids with min delay initiation of tongue movement, with swallow trigger generally at the level of the valleculae, and reduced tongue base retraction resulting in min oral and mi/mod base of tongue and vallecular residue across consistencies and textures (least vallecular residue with puree). Pt with trace penetration of thins with sequential cup sip and when attempting to swallow the barium tablet with thins (to the level of the vocal folds and removed). He expectorated the pill and took with puree. Pharyngeal transit was  noted to be greater on the right side when Pt was turned AP. Pt's primary deficit appears to be lingual weakness and reduced tongue base retraction with resulting vallecular residue. He is able to clear the residuals with repeat/dry swallows. Recommend self regulated regular textures (soft and moist solids will be easier to masticate and swallow) with thin liquids with pills whole in puree and follow with liquid wash. Pt may benefit from short term  SLP dysphagia therapy to work on lingual strengthening to facilitate pharyngeal clearance. Pt reports that he is willing to try therapy.   SUBJECTIVE:   SUBJECTIVE STATEMENT: "I think it is a little better. I had an iron transfusion the other day."  PAIN:  Are you having pain? No    OBJECTIVE:  OBJECTIVE IMPAIRMENTS: include dysphagia. These impairments are limiting patient from safety when swallowing. Factors affecting potential to achieve goals and functional outcome are severity of impairments and Time post onset . Patient will benefit from skilled SLP services to address above impairments and improve overall function.   REHAB POTENTIAL: Good     GOALS: Goals reviewed with patient? Yes   SHORT TERM GOALS: Target date: 04/15/2022   Pt will consume least restrictive diet of self regulated regular textures and thin liquids with use of compensatory strategies with indirect cues. Baseline: Pt consuming soft textures, some regular Goal status: Continue   2.  Pt will complete pharyngeal swallowing exercises including but not limited to: masako, effortful swallow, Mendelsohn, lingual press, chin tuck against resistance, and laryngeal closure with initial model provided by SLP and daily completion of 3x per day per Pt self-report. Baseline:  Goal status: Continue  PLAN:   SLP FREQUENCY: 1x/week   SLP DURATION: 3 weeks   PLANNED INTERVENTIONS: Pharyngeal strengthening exercises, Diet toleration management , Trials of upgraded texture/liquids, Cueing hierachy, SLP instruction and feedback, Compensatory strategies, Patient/family education, and Re-evaluation    TODAY'S TREATMENT:    Pt seen for dysphagia therapy following MBSS completed 03/04/2022. Pt had an iron infusion earlier this week and he reports that he thinks it has given him some more energy. His daughter noted that his voice seemed louder. He reports that he completed lingual press, chin tuck against resistance, Masako,  Mendelsohn, and sustained vowel phonation. Pt continues to present with lingual weakness and has difficulty completing the Masako and Mendelsohn. He benefited from tactile cues with hand to SLP's neck and then his own for feedback. He demonstrated improved lingual tension with lingual press exercise after feedback. He indicated that he has always loved coffee, but now finds the taste unappealing. Pt with improved vocal intensity with sustained vowel phonation. Pt to continue to complete exercises as assigned and continue treatment next session.  DATE: 03/18/22    Thank you,  Genene Churn, [place]  [place], [place] 03/18/2022, 2:01 PM

## 2022-03-25 ENCOUNTER — Ambulatory Visit (HOSPITAL_COMMUNITY): Payer: PPO | Attending: Family Medicine | Admitting: Speech Pathology

## 2022-03-25 ENCOUNTER — Encounter (HOSPITAL_COMMUNITY): Payer: Self-pay | Admitting: Speech Pathology

## 2022-03-25 DIAGNOSIS — R1312 Dysphagia, oropharyngeal phase: Secondary | ICD-10-CM

## 2022-03-25 NOTE — Therapy (Signed)
OUTPATIENT SPEECH LANGUAGE PATHOLOGY TREATMENT NOTE   Patient Name: Tommy Graham MRN: [id number] DOB:03/21/1940, 82 y.o., male Today's Date: 03/25/2022  PCP: Caryl Bis, MD REFERRING PROVIDER: Deliah Goody, PA-C  END OF SESSION:   End of Session - 03/25/22 1300     Visit Number 4    Number of Visits 5    Authorization Type [employer]    SLP Start Time 0945    SLP Stop Time  [id number]    SLP Time Calculation (min) 50 min    Activity Tolerance Patient tolerated treatment well             Past Medical History:  Diagnosis Date   Cancer ([place])    prostate   Diabetes mellitus without complication ([place])    Hypertension    Past Surgical History:  Procedure Laterality Date   CERVICAL SPINE SURGERY     CORONARY STENT INTERVENTION N/A 09/17/2021   Procedure: CORONARY STENT INTERVENTION;  Surgeon: Sherren Mocha, MD;  Location: [facility];  Service: Cardiovascular;  Laterality: N/A;   LEFT HEART CATH AND CORONARY ANGIOGRAPHY N/A 09/17/2021   Procedure: LEFT HEART CATH AND CORONARY ANGIOGRAPHY;  Surgeon: Sherren Mocha, MD;  Location: [facility];  Service: Cardiovascular;  Laterality: N/A;   PROSTATE SURGERY     RIGHT HEART CATH N/A 09/17/2021   Procedure: RIGHT HEART CATH;  Surgeon: Sherren Mocha, MD;  Location: [facility];  Service: Cardiovascular;  Laterality: N/A;   ROTATOR CUFF REPAIR Bilateral    Patient Active Problem List   Diagnosis Date Noted   Basal cell carcinoma (BCC) of skin of nose 11/04/2021   Gastroesophageal reflux disease 10/29/2021   Dysphagia 10/29/2021   SOB (shortness of breath) 10/05/2021   STEMI involving right coronary artery (HCC) 09/17/2021   Lumbar radiculopathy 07/16/2021   Low back pain 07/01/2021   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/02/2020   Cervical spine pain 07/09/2019   DDD (degenerative disc disease), cervical 07/09/2019   Pain in joint of left shoulder 06/28/2018   Acquired trigger finger of left middle  finger 06/28/2018   Pain in finger of left hand 06/28/2018   Erectile dysfunction after radical prostatectomy 06/03/2017   Malignant neoplasm of prostate ([place]) 04/21/2015   DIABETES MELLITUS, TYPE II 09/03/2006   HYPERTENSION 09/03/2006    ONSET DATE: 02/17/2022  REFERRING DIAG: Dysphagia  THERAPY DIAG:  Dysphagia, oropharyngeal phase  Rationale for Evaluation and Treatment: Rehabilitation  Tommy Graham is an 82 y.o. male with hx uncontrolled DM II, HTN, HLD. Admitted 09/17/21 with late presenting inferolateral STEMI with RV involvement c/b cardiogenic shock in setting of RV failure. Found to have proximal occlusion of RCA and 75% d LM/ostial LAD. Underwent emergent PCI/DES to RCA. Staged PCI LM/LAD to be planned at a later date. EF ~ 45%. He was referred for MBSS by Deliah Goody, PA (GI) and then follow dysphagia therapy.  MBSS: Pt presents with min oropharyngeal phase dysphagia characterized by min impaired lingual movement with liquids with min delay initiation of tongue movement, with swallow trigger generally at the level of the valleculae, and reduced tongue base retraction resulting in min oral and mi/mod base of tongue and vallecular residue across consistencies and textures (least vallecular residue with puree). Pt with trace penetration of thins with sequential cup sip and when attempting to swallow the barium tablet with thins (to the level of the vocal folds and removed). He expectorated the pill and took with puree. Pharyngeal transit was  noted to be greater on the right side when Pt was turned AP. Pt's primary deficit appears to be lingual weakness and reduced tongue base retraction with resulting vallecular residue. He is able to clear the residuals with repeat/dry swallows. Recommend self regulated regular textures (soft and moist solids will be easier to masticate and swallow) with thin liquids with pills whole in puree and follow with liquid wash. Pt may benefit from short term  SLP dysphagia therapy to work on lingual strengthening to facilitate pharyngeal clearance. Pt reports that he is willing to try therapy.   SUBJECTIVE:   SUBJECTIVE STATEMENT: "My swallowing is ok, but I need to take care of my heart."  PAIN:  Are you having pain? No    OBJECTIVE:  OBJECTIVE IMPAIRMENTS: include dysphagia. These impairments are limiting patient from safety when swallowing. Factors affecting potential to achieve goals and functional outcome are severity of impairments and Time post onset . Patient will benefit from skilled SLP services to address above impairments and improve overall function.   REHAB POTENTIAL: Good     GOALS: Goals reviewed with patient? Yes   SHORT TERM GOALS: Target date: 04/15/2022   Pt will consume least restrictive diet of self regulated regular textures and thin liquids with use of compensatory strategies with indirect cues. Baseline: Pt consuming soft textures, some regular Goal status: Continue   2.  Pt will complete pharyngeal swallowing exercises including but not limited to: masako, effortful swallow, Mendelsohn, lingual press, chin tuck against resistance, and laryngeal closure with initial model provided by SLP and daily completion of 3x per day per Pt self-report. Baseline:  Goal status: Continue  PLAN:   SLP FREQUENCY: 1x/week   SLP DURATION: 3 weeks   PLANNED INTERVENTIONS: Pharyngeal strengthening exercises, Diet toleration management , Trials of upgraded texture/liquids, Cueing hierachy, SLP instruction and feedback, Compensatory strategies, Patient/family education, and Re-evaluation    TODAY'S TREATMENT:    Pt seen for ongoing dysphagia therapy following MBSS completed 03/04/2022.  He reports that he completed lingual press, chin tuck against resistance, Masako, Mendelsohn, and sustained vowel phonation at home, but has difficulty with Masako. Pt asked to complete all exercises by following written cues and he required assist  with Caryl Ada and Masako. He also has trouble isolating lingual movement from his jaw. He benefited from feedback from SLP holding tongue depressor for resistance. Pt with improved vocal intensity with sustained vowel phonation. He reports improved "swallowing", however still presents with lingual weakness. He appears motivated to continue with exercises on his own. Will move our last SLP session to after his GI appointment which he has next week. He voiced that he primarily wants to address his cardiac issues because he feels this will give him more energy. Pt to continue to complete exercises as assigned and continue treatment next session.  DATE: 03/25/22    Thank you,  Genene Churn, [place]  [place], [place] 03/25/2022, 1:01 PM

## 2022-03-31 ENCOUNTER — Encounter (HOSPITAL_COMMUNITY): Payer: PPO | Admitting: Speech Pathology

## 2022-04-01 DIAGNOSIS — K219 Gastro-esophageal reflux disease without esophagitis: Secondary | ICD-10-CM | POA: Diagnosis not present

## 2022-04-01 DIAGNOSIS — R1310 Dysphagia, unspecified: Secondary | ICD-10-CM | POA: Diagnosis not present

## 2022-04-01 DIAGNOSIS — R194 Change in bowel habit: Secondary | ICD-10-CM | POA: Diagnosis not present

## 2022-04-05 ENCOUNTER — Encounter (HOSPITAL_COMMUNITY): Payer: Self-pay | Admitting: Speech Pathology

## 2022-04-05 ENCOUNTER — Ambulatory Visit (HOSPITAL_COMMUNITY): Payer: PPO | Admitting: Speech Pathology

## 2022-04-05 DIAGNOSIS — R1312 Dysphagia, oropharyngeal phase: Secondary | ICD-10-CM | POA: Diagnosis not present

## 2022-04-05 NOTE — Therapy (Signed)
OUTPATIENT SPEECH LANGUAGE PATHOLOGY TREATMENT NOTE   Patient Name: Tommy Graham MRN: [id number] DOB:1940/09/09, 82 y.o., male Today's Date: 04/05/2022  PCP: Caryl Bis, MD REFERRING PROVIDER: Deliah Goody, PA-C  END OF SESSION:   End of Session - 04/05/22 1043     Visit Number 5    Number of Visits 5    Authorization Type [employer]    SLP Start Time 1030    SLP Stop Time  1110    SLP Time Calculation (min) 40 min    Activity Tolerance Patient tolerated treatment well             Past Medical History:  Diagnosis Date   Cancer ([place])    prostate   Diabetes mellitus without complication ([place])    Hypertension    Past Surgical History:  Procedure Laterality Date   CERVICAL SPINE SURGERY     CORONARY STENT INTERVENTION N/A 09/17/2021   Procedure: CORONARY STENT INTERVENTION;  Surgeon: Sherren Mocha, MD;  Location: [facility];  Service: Cardiovascular;  Laterality: N/A;   LEFT HEART CATH AND CORONARY ANGIOGRAPHY N/A 09/17/2021   Procedure: LEFT HEART CATH AND CORONARY ANGIOGRAPHY;  Surgeon: Sherren Mocha, MD;  Location: [facility];  Service: Cardiovascular;  Laterality: N/A;   PROSTATE SURGERY     RIGHT HEART CATH N/A 09/17/2021   Procedure: RIGHT HEART CATH;  Surgeon: Sherren Mocha, MD;  Location: [facility];  Service: Cardiovascular;  Laterality: N/A;   ROTATOR CUFF REPAIR Bilateral    Patient Active Problem List   Diagnosis Date Noted   Basal cell carcinoma (BCC) of skin of nose 11/04/2021   Gastroesophageal reflux disease 10/29/2021   Dysphagia 10/29/2021   SOB (shortness of breath) 10/05/2021   STEMI involving right coronary artery (HCC) 09/17/2021   Lumbar radiculopathy 07/16/2021   Low back pain 07/01/2021   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/02/2020   Cervical spine pain 07/09/2019   DDD (degenerative disc disease), cervical 07/09/2019   Pain in joint of left shoulder 06/28/2018   Acquired trigger finger of left middle  finger 06/28/2018   Pain in finger of left hand 06/28/2018   Erectile dysfunction after radical prostatectomy 06/03/2017   Malignant neoplasm of prostate ([place]) 04/21/2015   DIABETES MELLITUS, TYPE II 09/03/2006   HYPERTENSION 09/03/2006    ONSET DATE: 02/17/2022  REFERRING DIAG: Dysphagia  THERAPY DIAG:  Dysphagia, oropharyngeal phase  Rationale for Evaluation and Treatment: Rehabilitation  [name] is an 82 y.o. male with hx uncontrolled DM II, HTN, HLD. Admitted 09/17/21 with late presenting inferolateral STEMI with RV involvement c/b cardiogenic shock in setting of RV failure. Found to have proximal occlusion of RCA and 75% d LM/ostial LAD. Underwent emergent PCI/DES to RCA. Staged PCI LM/LAD to be planned at a later date. EF ~ 45%. He was referred for MBSS by Deliah Goody, PA (GI) and then follow dysphagia therapy.  MBSS: Pt presents with min oropharyngeal phase dysphagia characterized by min impaired lingual movement with liquids with min delay initiation of tongue movement, with swallow trigger generally at the level of the valleculae, and reduced tongue base retraction resulting in min oral and mi/mod base of tongue and vallecular residue across consistencies and textures (least vallecular residue with puree). Pt with trace penetration of thins with sequential cup sip and when attempting to swallow the barium tablet with thins (to the level of the vocal folds and removed). He expectorated the pill and took with puree. Pharyngeal transit was  noted to be greater on the right side when Pt was turned AP. Pt's primary deficit appears to be lingual weakness and reduced tongue base retraction with resulting vallecular residue. He is able to clear the residuals with repeat/dry swallows. Recommend self regulated regular textures (soft and moist solids will be easier to masticate and swallow) with thin liquids with pills whole in puree and follow with liquid wash. Pt may benefit from short term  SLP dysphagia therapy to work on lingual strengthening to facilitate pharyngeal clearance. Pt reports that he is willing to try therapy.   SUBJECTIVE:   SUBJECTIVE STATEMENT: "My swallowing is ok, but I need to take care of my heart."  PAIN:  Are you having pain? No    OBJECTIVE:  OBJECTIVE IMPAIRMENTS: include dysphagia. These impairments are limiting patient from safety when swallowing. Factors affecting potential to achieve goals and functional outcome are severity of impairments and Time post onset . Patient will benefit from skilled SLP services to address above impairments and improve overall function.   REHAB POTENTIAL: Good     GOALS: Goals reviewed with patient? Yes   SHORT TERM GOALS: Target date: 04/15/2022   Pt will consume least restrictive diet of self regulated regular textures and thin liquids with use of compensatory strategies with indirect cues. Baseline: Pt consuming soft textures, some regular Goal status: Continue   2.  Pt will complete pharyngeal swallowing exercises including but not limited to: masako, effortful swallow, Mendelsohn, lingual press, chin tuck against resistance, and laryngeal closure with initial model provided by SLP and daily completion of 3x per day per Pt self-report. Baseline:  Goal status: Continue  PLAN:   SLP FREQUENCY: 1x/week   SLP DURATION: 3 weeks   PLANNED INTERVENTIONS: Pharyngeal strengthening exercises, Diet toleration management , Trials of upgraded texture/liquids, Cueing hierachy, SLP instruction and feedback, Compensatory strategies, Patient/family education, and Re-evaluation    TODAY'S TREATMENT:    Pt seen for ongoing dysphagia therapy following MBSS completed 03/04/2022.  He reports that he completed lingual press, chin tuck against resistance, Masako, Mendelsohn, and sustained vowel phonation at home, but has difficulty with Masako. Pt asked to complete all exercises by following written cues and he required assist  with Caryl Ada and Masako. He also has trouble isolating lingual movement from his jaw. He benefited from feedback from SLP holding tongue depressor for resistance. Pt with improved vocal intensity with sustained vowel phonation. He reports improved "swallowing", however still presents with lingual weakness. He appears motivated to continue with exercises on his own. Will move our last SLP session to after his GI appointment which he has next week. He voiced that he primarily wants to address his cardiac issues because he feels this will give him more energy. Pt to continue to complete exercises as assigned and continue treatment next session.  DATE: 04/05/22    Thank you,  Genene Churn, [place]  [place], [place] 04/05/2022, 10:44 AM

## 2022-04-12 DIAGNOSIS — Z6821 Body mass index (BMI) 21.0-21.9, adult: Secondary | ICD-10-CM | POA: Diagnosis not present

## 2022-04-12 DIAGNOSIS — R030 Elevated blood-pressure reading, without diagnosis of hypertension: Secondary | ICD-10-CM | POA: Diagnosis not present

## 2022-04-12 DIAGNOSIS — R300 Dysuria: Secondary | ICD-10-CM | POA: Diagnosis not present

## 2022-04-13 NOTE — Telephone Encounter (Signed)
Follow up scheduled 3/29

## 2022-04-16 ENCOUNTER — Encounter (HOSPITAL_COMMUNITY): Payer: Self-pay | Admitting: Internal Medicine

## 2022-04-16 ENCOUNTER — Telehealth (HOSPITAL_COMMUNITY): Payer: Self-pay

## 2022-04-16 ENCOUNTER — Ambulatory Visit (HOSPITAL_COMMUNITY)
Admission: RE | Admit: 2022-04-16 | Discharge: 2022-04-16 | Disposition: A | Discharge status: Home / Self Care | Payer: PPO | Source: Ambulatory Visit | Attending: Internal Medicine | Admitting: Internal Medicine

## 2022-04-16 VITALS — BP 130/80 | HR 68 | Wt 143.4 lb

## 2022-04-16 DIAGNOSIS — Z955 Presence of coronary angioplasty implant and graft: Secondary | ICD-10-CM | POA: Insufficient documentation

## 2022-04-16 DIAGNOSIS — E785 Hyperlipidemia, unspecified: Secondary | ICD-10-CM | POA: Insufficient documentation

## 2022-04-16 DIAGNOSIS — E875 Hyperkalemia: Secondary | ICD-10-CM | POA: Insufficient documentation

## 2022-04-16 DIAGNOSIS — E1165 Type 2 diabetes mellitus with hyperglycemia: Secondary | ICD-10-CM | POA: Insufficient documentation

## 2022-04-16 DIAGNOSIS — Z7984 Long term (current) use of oral hypoglycemic drugs: Secondary | ICD-10-CM | POA: Insufficient documentation

## 2022-04-16 DIAGNOSIS — I2119 ST elevation (STEMI) myocardial infarction involving other coronary artery of inferior wall: Secondary | ICD-10-CM | POA: Insufficient documentation

## 2022-04-16 DIAGNOSIS — I110 Hypertensive heart disease with heart failure: Secondary | ICD-10-CM | POA: Insufficient documentation

## 2022-04-16 DIAGNOSIS — M7989 Other specified soft tissue disorders: Secondary | ICD-10-CM | POA: Diagnosis not present

## 2022-04-16 DIAGNOSIS — Z7901 Long term (current) use of anticoagulants: Secondary | ICD-10-CM | POA: Diagnosis not present

## 2022-04-16 DIAGNOSIS — K7200 Acute and subacute hepatic failure without coma: Secondary | ICD-10-CM | POA: Diagnosis not present

## 2022-04-16 DIAGNOSIS — Z794 Long term (current) use of insulin: Secondary | ICD-10-CM | POA: Insufficient documentation

## 2022-04-16 DIAGNOSIS — R627 Adult failure to thrive: Secondary | ICD-10-CM | POA: Diagnosis not present

## 2022-04-16 DIAGNOSIS — I5022 Chronic systolic (congestive) heart failure: Secondary | ICD-10-CM | POA: Diagnosis not present

## 2022-04-16 DIAGNOSIS — I480 Paroxysmal atrial fibrillation: Secondary | ICD-10-CM | POA: Insufficient documentation

## 2022-04-16 DIAGNOSIS — F32A Depression, unspecified: Secondary | ICD-10-CM | POA: Diagnosis not present

## 2022-04-16 DIAGNOSIS — R251 Tremor, unspecified: Secondary | ICD-10-CM | POA: Diagnosis not present

## 2022-04-16 DIAGNOSIS — Z79899 Other long term (current) drug therapy: Secondary | ICD-10-CM | POA: Diagnosis not present

## 2022-04-16 DIAGNOSIS — I2510 Atherosclerotic heart disease of native coronary artery without angina pectoris: Secondary | ICD-10-CM | POA: Diagnosis not present

## 2022-04-16 DIAGNOSIS — G4733 Obstructive sleep apnea (adult) (pediatric): Secondary | ICD-10-CM | POA: Diagnosis not present

## 2022-04-16 LAB — BASIC METABOLIC PANEL
Anion gap: 12 (ref 5–15)
BUN: 31 mg/dL — ABNORMAL HIGH (ref 8–23)
CO2: 25 mmol/L (ref 22–32)
Calcium: 9.2 mg/dL (ref 8.9–10.3)
Chloride: 101 mmol/L (ref 98–111)
Creatinine, Ser: 1.46 mg/dL — ABNORMAL HIGH (ref 0.61–1.24)
GFR, Estimated: 48 mL/min — ABNORMAL LOW (ref >60)
Glucose, Bld: 86 mg/dL (ref 70–99)
Potassium: 4.2 mmol/L (ref 3.5–5.1)
Sodium: 138 mmol/L (ref 135–145)

## 2022-04-16 LAB — BRAIN NATRIURETIC PEPTIDE: B Natriuretic Peptide: 2724.7 pg/mL — ABNORMAL HIGH (ref 0.0–100.0)

## 2022-04-16 MED ORDER — TORSEMIDE 20 MG PO TABS: 40.0000 mg | ORAL_TABLET | Freq: Every day | ORAL | 3 refills | Status: DC

## 2022-04-16 MED ORDER — TORSEMIDE 20 MG PO TABS: 40.0000 mg | ORAL_TABLET | Freq: Two times a day (BID) | ORAL | 3 refills | Status: DC

## 2022-04-16 MED ORDER — POTASSIUM CHLORIDE CRYS ER 20 MEQ PO TBCR: 20.0000 meq | EXTENDED_RELEASE_TABLET | Freq: Every day | ORAL | 3 refills | Status: DC

## 2022-04-16 NOTE — Progress Notes (Signed)
ADVANCED HF CLINIC NOTE   PCP: Caryl Bis, MD Sleep Cardiology: Dr Radford Pax  HF Cardiologist: Dr Haroldine Laws  HPI: 82 y.o. male with hx uncontrolled DM II, HTN, HLD. Admitted 09/17/21 with late presenting inferolateral STEMI with RV involvement c/b cardiogenic shock in setting of RV failure. Found to have proximal occlusion of RCA and 75% d LM/ostial LAD. Underwent emergent PCI/DES to RCA. Staged PCI LM/LAD to be planned at a later date. EF ~ 45%.    Hemodynamics c/w low-output RV failure. Initially on Milrinone for inotrope support, later switched to DBA/NE d/t persistent low-output. Developed AKI and shock liver.  Course c/b paroxysmal AF. Loaded with IV amio, in and out of a fib. Rate controlled.  Amio loading to be continued after discharge.  Zio 10/05/21 io 29 % A flutter.   He was seen in the HF clinic 10/05/21 and was feeling bad due to fatigue. K 6.3, SCr 1.5. Dig , spiro, and losartan stopped. Given Lokelma.    Follow up 11/23, NYHA III and volume ok. Main issue was FTT, GI working up. Called cardiology after hours 12/23 with c/o SOB and leg swelling. Advised increase Lasix dose for a few days then use PRN.  He returns with his family (wife and son, who is an Therapist, sports). Overall much worse. Has had several UTIs after starting Farxiga. Still struggling with LE edema. Sleeps a lot. No CP. Very depressed. Struggling with his memory. Unable to get compression stockings on. Tremor worse    Echo 03/16/22: EF 30-35% RV mod HK  ROS: All systems negative except as listed in HPI, PMH and Problem List.  SH:  Social History   Socioeconomic History   Marital status: Married    Spouse name: Not on file   Number of children: Not on file   Years of education: Not on file   Highest education level: Not on file  Occupational History   Not on file  Tobacco Use   Smoking status: Never   Smokeless tobacco: Never  Vaping Use   Vaping Use: Never used  Substance and Sexual Activity   Alcohol  use: Never   Drug use: Never   Sexual activity: Not on file  Other Topics Concern   Not on file  Social History Narrative   Not on file   Social Determinants of Health   Financial Resource Strain: Not on file  Food Insecurity: No Food Insecurity (09/23/2021)   Hunger Vital Sign    Worried About Running Out of Food in the Last Year: Never true    Ran Out of Food in the Last Year: Never true  Transportation Needs: No Transportation Needs (09/23/2021)   PRAPARE - Hydrologist (Medical): No    Lack of Transportation (Non-Medical): No  Physical Activity: Not on file  Stress: Not on file  Social Connections: Not on file  Intimate Partner Violence: Not At Risk (09/23/2021)   Humiliation, Afraid, Rape, and Kick questionnaire    Fear of Current or Ex-Partner: No    Emotionally Abused: No    Physically Abused: No    Sexually Abused: No    FH: History reviewed. No pertinent family history.  Past Medical History:  Diagnosis Date   Cancer [facility])    prostate   Diabetes mellitus without complication (HCC)    Hypertension     Current Outpatient Medications  Medication Sig Dispense Refill   amiodarone (PACERONE) 200 MG tablet Take 0.5 tablets (100 mg total)  by mouth daily. 15 tablet 8   apixaban (ELIQUIS) 5 MG TABS tablet Take 1 tablet (5 mg total) by mouth 2 (two) times daily. 60 tablet 5   clopidogrel (PLAVIX) 75 MG tablet Take 1 tablet (75 mg total) by mouth daily. 30 tablet 5   dapagliflozin propanediol (FARXIGA) 10 MG TABS tablet Take 1 tablet (10 mg total) by mouth daily before breakfast. 30 tablet 8   dorzolamide (TRUSOPT) 2 % ophthalmic solution Place 1 drop into both eyes in the morning.     DULoxetine (CYMBALTA) 30 MG capsule Take 30 mg by mouth in the morning.     furosemide (LASIX) 40 MG tablet Take 1 tablet (40 mg total) by mouth daily as needed (weight gain (3lbs in 1 day or 5lbs in 1 week) or worsening lower extremity swelling or shortness of  breath). 30 tablet 1   insulin glargine (LANTUS) 100 UNIT/ML injection Inject 8-20 Units into the skin at bedtime.     latanoprost (XALATAN) 0.005 % ophthalmic solution Place 1 drop into both eyes at bedtime.     metFORMIN (GLUCOPHAGE) 850 MG tablet Take 850 mg by mouth in the morning and at bedtime. (Morning & afternoon)     ondansetron (ZOFRAN) 8 MG tablet Take 8 mg by mouth as needed for nausea or vomiting.     pantoprazole (PROTONIX) 40 MG tablet Take 1 tablet (40 mg total) by mouth 2 (two) times daily. 90 tablet 3   potassium chloride SA (KLOR-CON M) 20 MEQ tablet Take 1 tablet (20 mEq total) by mouth as needed. 30 tablet 0   propranolol (INDERAL) 10 MG tablet Take 10 mg by mouth 2 (two) times daily.     No current facility-administered medications for this encounter.   BP 130/80   Pulse 68   Wt 65 kg (143 lb 6.4 oz)   SpO2 100%   BMI 21.18 kg/m   Wt Readings from Last 3 Encounters:  04/16/22 65 kg (143 lb 6.4 oz)  02/16/22 67.1 kg (148 lb)  02/05/22 65.1 kg (143 lb 8.3 oz)   PHYSICAL EXAM: General:  Elderly weak appearing. No resp difficulty HEENT: normal Neck: supple. JVP 8-9  Carotids 2+ bilat; no bruits. No lymphadenopathy or thryomegaly appreciated. Cor: PMI nondisplaced. Regular rate & rhythm. No rubs, gallops or murmurs. Lungs: clear Abdomen: soft, nontender, nondistended. No hepatosplenomegaly. No bruits or masses. Good bowel sounds. Extremities: no cyanosis, clubbing, rash, edema Neuro: alert & orientedx3, cranial nerves grossly intact. moves all 4 extremities w/o difficulty. Affect flat + tremo   ASSESSMENT & PLAN:   1. 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 Required inotrope support with DBA. - Echo (9/23): EF 30-35% RV moderately reduced trivial MR - Echo 03/16/22: EF 30-35% RV mod HK - NYHA IIIB-IV. Volume significantly up with R>>L symptoms. Not responding well to po  lasix - Will use Furoscix 80 x 3 days then switch to torsemide 40 daily + KCl 20  - Stop Farxiga due to UTIs - On propanolol 10 mg bid for hx tremor. Will increase to 20 - No spiro/Entresto with recent hyperK - Wear compression hose - Labs today.  - Continues to struggle with progressive HF and FTT. - GDMT limited as above - Will focus on diuresis for now - Not candidate for ICD with comorbidities - F/u next week with labs  2. 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 - No s/s angina - He is interested in PCI LM/LAD but doubt this is contributing to his current condition as LM stenosis not critical and PCI would be high risk. Continue medical management  3. Failure to thrive/anorexia/dysphagia - Severe. Concern for stricture/ No improvement with PPI - Has seen GI and suggested EGD but waiting for timing after MI. - Can proceed with EGD on plavix for diagnostic purposes only. If intervention need for critical issue can likely stop Plavix for a few days - Has had some improvement with speech pathology  - Continue Ensure supplements - Continue GI f/u  4. DM2, poorly controlled - a1c 9.5% - On metformin and insulin - Planning to establish with Endocrine  5. PAF/AFL - in and out since recent admit, - 3 day zio monitor (9/23). AFL burden 29%. - Regular today. - Continue amio 100 mg daily. - Continue Eliquis 5 mg bid.   6. Hyperkalemia  - K has been running > 5 - BMET today. - Will start low-dose Kcl given increasing doses of diuretics. Watch closely   7. OSA - Unable to wear CPAP. May be contributing to fatigue. - In the past he saw Dr Radford Pax.  - Encouraged he try to wear CPAP.  Total time spent 45 minutes. Over half that time spent discussing above.    Glori Bickers MD 12:22 PM

## 2022-04-16 NOTE — Patient Instructions (Signed)
STOP Farxiga   STOP Lasix.  START Torsemide 40 mg daily   CHANGE Potassium to 20 mEq ( 1 Tablet daily)  Your provider has order Furoscix for you. This is an on-body infuser that gives you a dose of Furosemide.   It will be shipped to your home   [employer] will call you to discuss before shipping so, PLEASE answer unknown calls  For questions regarding the device call [employer]uro[employer] at [phone]  Ensure you write down the time you start your infusion so that if there is a problem you will know how long the infusion lasted  Use Furoscix only AS DIRECTED by our office  Dosing Directions:   Day 1=  1 kit  Day 2= 1 Kit  Day 3= 1 Kit  TAKE YOUR TORSEMIDE UNTIL YOU GET THE FUROSCIX DELIVERY THEN HOLD THE TORSEMIDE FOR 2 DAYS.  You have been referred to Neurology. They will call you to arrange your appointment.  Your physician recommends that you schedule a follow-up appointment in: as scheduled.  If you have any questions or concerns before your next appointment please send [place] a message through [place] or call our office at [phone].    TO LEAVE A MESSAGE FOR THE NURSE SELECT OPTION 2, PLEASE LEAVE A MESSAGE INCLUDING: YOUR NAME DATE OF BIRTH CALL BACK NUMBER REASON FOR CALL**this is important as we prioritize the call backs  YOU WILL RECEIVE A CALL BACK THE SAME DAY AS LONG AS YOU CALL BEFORE 4:00 PM  At the [facility], you and your health needs are our priority. As part of our continuing mission to provide you with exceptional heart care, we have created designated Provider Care Teams. These Care Teams include your primary Cardiologist (physician) and Advanced Practice Providers (APPs- Physician Assistants and Nurse Practitioners) who all work together to provide you with the care you need, when you need it.   You may see any of the following providers on your designated Care Team at your next follow up: Dr Glori Bickers Dr Loralie Champagne Dr. Roxana Hires, NP Lyda Jester, [place] [facility] [place], [place] Forestine Na, NP Audry Riles, PharmD   Please be sure to bring in all your medications bottles to every appointment.    Thank you for choosing [facility]

## 2022-04-16 NOTE — Telephone Encounter (Signed)
Order faxed to Furo[employer]ix on 04-16-22 at 1.30pm

## 2022-04-16 NOTE — Progress Notes (Signed)
Medication Samples have been provided to the patient.  Drug name: Furoscix       Strength: 80 mg        Qty: 1  LOTCU:[id number]  Exp.Date: 10/18/2023  Dosing instructions: Take as directed by clinic  The patient has been instructed regarding the correct time, dose, and frequency of taking this medication, including desired effects and most common side effects.   Juanita Laster Barth Trella 12:39 PM 04/16/2022

## 2022-04-17 ENCOUNTER — Other Ambulatory Visit (HOSPITAL_COMMUNITY): Payer: Self-pay | Admitting: Adult Health

## 2022-04-23 ENCOUNTER — Ambulatory Visit (HOSPITAL_COMMUNITY)
Admission: RE | Admit: 2022-04-23 | Discharge: 2022-04-23 | Disposition: A | Discharge status: Home / Self Care | Payer: PPO | Source: Ambulatory Visit | Attending: Family Medicine | Admitting: Family Medicine

## 2022-04-23 ENCOUNTER — Encounter (HOSPITAL_COMMUNITY): Payer: Self-pay

## 2022-04-23 VITALS — BP 140/80 | HR 75 | Wt 122.0 lb

## 2022-04-23 DIAGNOSIS — M7989 Other specified soft tissue disorders: Secondary | ICD-10-CM | POA: Diagnosis not present

## 2022-04-23 DIAGNOSIS — E1169 Type 2 diabetes mellitus with other specified complication: Secondary | ICD-10-CM | POA: Diagnosis not present

## 2022-04-23 DIAGNOSIS — Z79899 Other long term (current) drug therapy: Secondary | ICD-10-CM | POA: Diagnosis not present

## 2022-04-23 DIAGNOSIS — I4892 Unspecified atrial flutter: Secondary | ICD-10-CM | POA: Insufficient documentation

## 2022-04-23 DIAGNOSIS — Z7902 Long term (current) use of antithrombotics/antiplatelets: Secondary | ICD-10-CM | POA: Insufficient documentation

## 2022-04-23 DIAGNOSIS — Z7901 Long term (current) use of anticoagulants: Secondary | ICD-10-CM | POA: Insufficient documentation

## 2022-04-23 DIAGNOSIS — G4733 Obstructive sleep apnea (adult) (pediatric): Secondary | ICD-10-CM | POA: Diagnosis not present

## 2022-04-23 DIAGNOSIS — Z794 Long term (current) use of insulin: Secondary | ICD-10-CM | POA: Diagnosis not present

## 2022-04-23 DIAGNOSIS — E1165 Type 2 diabetes mellitus with hyperglycemia: Secondary | ICD-10-CM | POA: Insufficient documentation

## 2022-04-23 DIAGNOSIS — Z681 Body mass index (BMI) 19 or less, adult: Secondary | ICD-10-CM | POA: Diagnosis not present

## 2022-04-23 DIAGNOSIS — E785 Hyperlipidemia, unspecified: Secondary | ICD-10-CM | POA: Diagnosis not present

## 2022-04-23 DIAGNOSIS — I5022 Chronic systolic (congestive) heart failure: Secondary | ICD-10-CM

## 2022-04-23 DIAGNOSIS — Z955 Presence of coronary angioplasty implant and graft: Secondary | ICD-10-CM | POA: Insufficient documentation

## 2022-04-23 DIAGNOSIS — K7200 Acute and subacute hepatic failure without coma: Secondary | ICD-10-CM | POA: Diagnosis not present

## 2022-04-23 DIAGNOSIS — R627 Adult failure to thrive: Secondary | ICD-10-CM | POA: Diagnosis not present

## 2022-04-23 DIAGNOSIS — I2510 Atherosclerotic heart disease of native coronary artery without angina pectoris: Secondary | ICD-10-CM

## 2022-04-23 DIAGNOSIS — R1310 Dysphagia, unspecified: Secondary | ICD-10-CM

## 2022-04-23 DIAGNOSIS — E875 Hyperkalemia: Secondary | ICD-10-CM | POA: Insufficient documentation

## 2022-04-23 DIAGNOSIS — I2119 ST elevation (STEMI) myocardial infarction involving other coronary artery of inferior wall: Secondary | ICD-10-CM | POA: Insufficient documentation

## 2022-04-23 DIAGNOSIS — Z7984 Long term (current) use of oral hypoglycemic drugs: Secondary | ICD-10-CM | POA: Diagnosis not present

## 2022-04-23 DIAGNOSIS — I110 Hypertensive heart disease with heart failure: Secondary | ICD-10-CM | POA: Insufficient documentation

## 2022-04-23 DIAGNOSIS — I480 Paroxysmal atrial fibrillation: Secondary | ICD-10-CM | POA: Insufficient documentation

## 2022-04-23 DIAGNOSIS — R630 Anorexia: Secondary | ICD-10-CM | POA: Insufficient documentation

## 2022-04-23 LAB — BASIC METABOLIC PANEL
Anion gap: 14 (ref 5–15)
BUN: 32 mg/dL — ABNORMAL HIGH (ref 8–23)
CO2: 30 mmol/L (ref 22–32)
Calcium: 9.4 mg/dL (ref 8.9–10.3)
Chloride: 90 mmol/L — ABNORMAL LOW (ref 98–111)
Creatinine, Ser: 1.55 mg/dL — ABNORMAL HIGH (ref 0.61–1.24)
GFR, Estimated: 45 mL/min — ABNORMAL LOW (ref >60)
Glucose, Bld: 148 mg/dL — ABNORMAL HIGH (ref 70–99)
Potassium: 4.8 mmol/L (ref 3.5–5.1)
Sodium: 134 mmol/L — ABNORMAL LOW (ref 135–145)

## 2022-04-23 LAB — BRAIN NATRIURETIC PEPTIDE: B Natriuretic Peptide: 1753.3 pg/mL — ABNORMAL HIGH (ref 0.0–100.0)

## 2022-04-23 LAB — MAGNESIUM: Magnesium: 1.6 mg/dL — ABNORMAL LOW (ref 1.7–2.4)

## 2022-04-23 NOTE — Progress Notes (Signed)
ReDS Vest / Clip - 04/23/22 1100       ReDS Vest / Clip   Station Marker C    Ruler Value 25    ReDS Value Range Moderate volume overload    ReDS Actual Value 38

## 2022-04-23 NOTE — Patient Instructions (Signed)
Medication Changes:  Can take an extra Torsemide 40mg  as needed. Take an extra Potassium when taking extra Torsemide.  Lab Work:  Labs done today, your results will be available in MyChart, we will contact you for abnormal readings.   Testing/Procedures:  N/A  Referrals:  N/A  Special Instructions // Education:  Your provider has order Furoscix for you. This is an on-body infuser that gives you a dose of Furosemide.   It will be shipped to your home   [employer] will call you to discuss before shipping so, PLEASE answer unknown calls  For questions regarding the device call [employer] at [phone]  Ensure you write down the time you start your infusion so that if there is a problem you will know how long the infusion lasted  Use Furoscix only AS DIRECTED by our office   Follow-Up in: 3 month recall. We will call you to set up follow up appointment.  At the [facility], you and your health needs are our priority. We have a designated team specialized in the treatment of Heart Failure. This Care Team includes your primary Heart Failure Specialized Cardiologist (physician), Advanced Practice Providers (APPs- Physician Assistants and Nurse Practitioners), and Pharmacist who all work together to provide you with the care you need, when you need it.   You may see any of the following providers on your designated Care Team at your next follow up:  Dr. Arvilla Meres Dr. Marca Ancona Dr. Marcos Eke, NP Robbie Lis, [place] [facility] [place], [place] Brynda Peon, NP Karle Plumber, PharmD   Please be sure to bring in all your medications bottles to every appointment.   Need to Contact us:  If you have any questions or concerns before your next appointment please send [place] a message through [place] or call our office at [phone].    TO LEAVE A MESSAGE FOR THE NURSE SELECT OPTION 2, PLEASE LEAVE A MESSAGE  INCLUDING: YOUR NAME DATE OF BIRTH CALL BACK NUMBER REASON FOR CALL**this is important as we prioritize the call backs  YOU WILL RECEIVE A CALL BACK THE SAME DAY AS LONG AS YOU CALL BEFORE 4:00 PM

## 2022-04-23 NOTE — Progress Notes (Signed)
ADVANCED HF CLINIC NOTE   PCP: Richardean Chimera, MD Sleep Cardiology: Dr Mayford Knife  HF Cardiologist: Dr Gala Romney  HPI: 82 y.o. male with hx uncontrolled DM II, HTN, HLD. Admitted 09/17/21 with late presenting inferolateral STEMI with RV involvement c/b cardiogenic shock in setting of RV failure. Found to have proximal occlusion of RCA and 75% d LM/ostial LAD. Underwent emergent PCI/DES to RCA. Staged PCI LM/LAD to be planned at a later date. EF ~ 45%.    Hemodynamics c/w low-output RV failure. Initially on Milrinone for inotrope support, later switched to DBA/NE d/t persistent low-output. Developed AKI and shock liver.  Course c/b paroxysmal AF. Loaded with IV amio, in and out of a fib. Rate controlled.  Amio loading to be continued after discharge.  Zio 10/05/21 io 29 % A flutter.   He was seen in the HF clinic 10/05/21 and was feeling bad due to fatigue. K 6.3, SCr 1.5. Dig , spiro, and losartan stopped. Given Lokelma.    Follow up 11/23, NYHA III and volume ok. Main issue was FTT, GI working up. Called cardiology after hours 12/23 with c/o SOB and leg swelling. Advised increase Lasix dose for a few days then use PRN.  Echo 03/16/22: EF 30-35% RV mod HK  Follow up 04/16/22, volume overload R>L with NYHA IIIb-IV. Instructed to use Furoscix 80 x 3 days, Marcelline Deist stopped with UTIs.   Today he returns for HF follow up with family. Overall feeling weak but breathing improved. He used Furoscix x 2 days. Trying to eat more, sleeping better. Weight down 20 lbs. He is dizzy, had a fall last weekend after he used the first Furoscix. Denies palpitations, abnormal bleeding, CP,  or PND/Orthopnea. Appetite fair, food still tastes off. No fever or chills. Weight at home 116 pounds. Taking all medications. Has compression hose at home.  ROS: All systems negative except as listed in HPI, PMH and Problem List.  SH:  Social History   Socioeconomic History   Marital status: Married    Spouse name: Not on  file   Number of children: Not on file   Years of education: Not on file   Highest education level: Not on file  Occupational History   Not on file  Tobacco Use   Smoking status: Never   Smokeless tobacco: Never  Vaping Use   Vaping Use: Never used  Substance and Sexual Activity   Alcohol use: Never   Drug use: Never   Sexual activity: Not on file  Other Topics Concern   Not on file  Social History Narrative   Not on file   Social Determinants of Health   Financial Resource Strain: Not on file  Food Insecurity: No Food Insecurity (09/23/2021)   Hunger Vital Sign    Worried About Running Out of Food in the Last Year: Never true    Ran Out of Food in the Last Year: Never true  Transportation Needs: No Transportation Needs (09/23/2021)   PRAPARE - Administrator, Civil Service (Medical): No    Lack of Transportation (Non-Medical): No  Physical Activity: Not on file  Stress: Not on file  Social Connections: Not on file  Intimate Partner Violence: Not At Risk (09/23/2021)   Humiliation, Afraid, Rape, and Kick questionnaire    Fear of Current or Ex-Partner: No    Emotionally Abused: No    Physically Abused: No    Sexually Abused: No   FH: No family history on file.  Past Medical History:  Diagnosis Date   Cancer [facility](HCC)    prostate   Diabetes mellitus without complication (HCC)    Hypertension    Current Outpatient Medications  Medication Sig Dispense Refill   amiodarone (PACERONE) 200 MG tablet Take 0.5 tablets (100 mg total) by mouth daily. 15 tablet 8   clopidogrel (PLAVIX) 75 MG tablet TAKE ONE (1) TABLET BY MOUTH EVERY DAY 30 tablet 5   dorzolamide (TRUSOPT) 2 % ophthalmic solution Place 1 drop into both eyes in the morning.     DULoxetine (CYMBALTA) 30 MG capsule Take 30 mg by mouth in the morning.     ELIQUIS 5 MG TABS tablet TAKE ONE (1) TABLET BY MOUTH TWO (2) TIMES DAILY 60 tablet 5   insulin glargine (LANTUS) 100 UNIT/ML injection Inject 8-20 Units  into the skin at bedtime.     latanoprost (XALATAN) 0.005 % ophthalmic solution Place 1 drop into both eyes at bedtime.     metFORMIN (GLUCOPHAGE) 850 MG tablet Take 850 mg by mouth in the morning and at bedtime. (Morning & afternoon)     ondansetron (ZOFRAN) 8 MG tablet Take 8 mg by mouth as needed for nausea or vomiting.     pantoprazole (PROTONIX) 40 MG tablet Take 1 tablet (40 mg total) by mouth 2 (two) times daily. 90 tablet 3   potassium chloride SA (KLOR-CON M) 20 MEQ tablet Take 1 tablet (20 mEq total) by mouth daily. 90 tablet 3   propranolol (INDERAL) 10 MG tablet Take 10 mg by mouth 2 (two) times daily.     torsemide (DEMADEX) 20 MG tablet Take 2 tablets (40 mg total) by mouth daily. 180 tablet 3   No current facility-administered medications for this encounter.   BP (!) 140/80   Pulse 75   Wt 55.3 kg (122 lb)   SpO2 99%   BMI 18.02 kg/m   Wt Readings from Last 3 Encounters:  04/23/22 55.3 kg (122 lb)  04/16/22 65 kg (143 lb 6.4 oz)  02/16/22 67.1 kg (148 lb)   PHYSICAL EXAM: General:  NAD. No resp difficulty, weak-appearing, elderly, cachectic HEENT: Normal Neck: Supple. No JVD. Carotids 2+ bilat; no bruits. No lymphadenopathy or thryomegaly appreciated. Cor: PMI nondisplaced. Regular rate & rhythm. No rubs, gallops or murmurs. Lungs: Clear Abdomen: Soft, nontender, nondistended. No hepatosplenomegaly. No bruits or masses. Good bowel sounds. Extremities: No cyanosis, clubbing, rash, 1+ BLE pre-tibial edema Neuro: Alert & oriented x 3, cranial nerves grossly intact. Moves all 4 extremities w/o difficulty. Flat affect, + tremor  REDs: 38%  ECG (personally reviewed): NSR  73 bpm, QTc 495 msec  ASSESSMENT & PLAN:   1. 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 Required inotrope support with DBA. - Echo (9/23): EF 30-35% RV moderately reduced trivial MR - Echo 03/16/22: EF 30-35% RV  mod HK - Improved NYHA III-IIIb. Volume better today, REDs 38%, weight down 21 lbs. Will not push further diuresis with recent fall. - Continues to struggle with progressive HF and FTT. - Continue torsemide 40 daily + KCl 20 daily. Discussed taking extra torsemide 40 mg/20 KCL PRN for home weight > 120 lbs. - Will order more Furoscix for home PRN use if becomres volume overload refractory to escalation in po diuretics. - Continue propranolol 10 mg bid. (Hx tremor)  - No spiro/Entresto with recent hyperK - Off Farxiga due to UTIs - Wear compression hose -  Not candidate for ICD with comorbidities - Labs today.  2. 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 - Continue plavix + Eliquis. - He is interested in PCI LM/LAD but doubt this is contributing to his current condition as LM stenosis not critical and PCI would be high risk.  - Continue medical management  3. Failure to thrive/anorexia/dysphagia - Severe. Concern for stricture/ No improvement with PPI - Has seen GI and suggested EGD but waiting for timing after MI. - Can proceed with EGD on plavix for diagnostic purposes only. If intervention need for critical issue can likely stop Plavix for a few days - Has had some improvement with speech pathology  - Continue Ensure supplements - Continue GI f/u  4. DM2, poorly controlled - a1c 9.5% - On metformin and insulin - Planning to establish with Endocrine  5. PAF/AFL - in and out since recent admit, - 3 day zio monitor (9/23). AFL burden 29%. - Regular today. - Continue amio 100 mg daily. - Continue Eliquis 5 mg bid. No bleeding issues.  6. Hyperkalemia  - K has been running > 5 - BMET today.  7. OSA - Unable to wear CPAP. May be contributing to fatigue. - In the past he saw Dr Mayford Knife.  - Encouraged he try to wear CPAP.  Follow up in 3 months. Discussed  when to use PRN torsemide and PRN Furoscix with family.  Anderson Malta Rania Prothero FNP-BC 11:32 AM

## 2022-05-21 DIAGNOSIS — R1310 Dysphagia, unspecified: Secondary | ICD-10-CM | POA: Diagnosis not present

## 2022-05-21 DIAGNOSIS — K219 Gastro-esophageal reflux disease without esophagitis: Secondary | ICD-10-CM | POA: Diagnosis not present

## 2022-06-02 DIAGNOSIS — E1165 Type 2 diabetes mellitus with hyperglycemia: Secondary | ICD-10-CM | POA: Diagnosis not present

## 2022-06-02 DIAGNOSIS — J61 Pneumoconiosis due to asbestos and other mineral fibers: Secondary | ICD-10-CM | POA: Diagnosis not present

## 2022-06-02 DIAGNOSIS — E440 Moderate protein-calorie malnutrition: Secondary | ICD-10-CM | POA: Diagnosis not present

## 2022-06-02 DIAGNOSIS — K5790 Diverticulosis of intestine, part unspecified, without perforation or abscess without bleeding: Secondary | ICD-10-CM | POA: Diagnosis not present

## 2022-06-02 DIAGNOSIS — I10 Essential (primary) hypertension: Secondary | ICD-10-CM | POA: Diagnosis not present

## 2022-06-02 DIAGNOSIS — E7849 Other hyperlipidemia: Secondary | ICD-10-CM | POA: Diagnosis not present

## 2022-06-02 DIAGNOSIS — J440 Chronic obstructive pulmonary disease with acute lower respiratory infection: Secondary | ICD-10-CM | POA: Diagnosis not present

## 2022-06-02 DIAGNOSIS — Z681 Body mass index (BMI) 19 or less, adult: Secondary | ICD-10-CM | POA: Diagnosis not present

## 2022-06-02 DIAGNOSIS — D692 Other nonthrombocytopenic purpura: Secondary | ICD-10-CM | POA: Diagnosis not present

## 2022-06-02 DIAGNOSIS — E538 Deficiency of other specified B group vitamins: Secondary | ICD-10-CM | POA: Diagnosis not present

## 2022-06-02 DIAGNOSIS — F331 Major depressive disorder, recurrent, moderate: Secondary | ICD-10-CM | POA: Diagnosis not present

## 2022-06-02 DIAGNOSIS — R339 Retention of urine, unspecified: Secondary | ICD-10-CM | POA: Diagnosis not present

## 2022-06-02 DIAGNOSIS — D510 Vitamin B12 deficiency anemia due to intrinsic factor deficiency: Secondary | ICD-10-CM | POA: Diagnosis not present

## 2022-06-03 ENCOUNTER — Telehealth (HOSPITAL_COMMUNITY): Payer: Self-pay | Admitting: Cardiology

## 2022-06-04 NOTE — Telephone Encounter (Signed)
Patients son called after visit with PCP to request decrease in amiodarone.  Pt still has trouble with decreased appetite and expressed a foul/metallic taste in mouth. All swallowing testing -EGD-dysphagia work up complete.  Pcp reported this dysgeusia  could be related amiodarone however advised to review further with cards.   Please advise.

## 2022-06-07 MED ORDER — AMIODARONE HCL 200 MG PO TABS: 100.0000 mg | ORAL_TABLET | ORAL | 8 refills | Status: DC

## 2022-06-07 NOTE — Telephone Encounter (Signed)
Pt aware- pts son aware

## 2022-08-03 DIAGNOSIS — H401131 Primary open-angle glaucoma, bilateral, mild stage: Secondary | ICD-10-CM | POA: Diagnosis not present

## 2022-08-03 DIAGNOSIS — H04123 Dry eye syndrome of bilateral lacrimal glands: Secondary | ICD-10-CM | POA: Diagnosis not present

## 2022-09-07 ENCOUNTER — Telehealth (HOSPITAL_COMMUNITY): Payer: Self-pay | Admitting: Pharmacy Technician

## 2022-09-07 ENCOUNTER — Encounter (HOSPITAL_COMMUNITY): Payer: Self-pay | Admitting: Internal Medicine

## 2022-09-07 ENCOUNTER — Other Ambulatory Visit (HOSPITAL_COMMUNITY): Payer: Self-pay

## 2022-09-07 ENCOUNTER — Ambulatory Visit (HOSPITAL_COMMUNITY)
Admission: RE | Admit: 2022-09-07 | Discharge: 2022-09-07 | Disposition: A | Discharge status: Home / Self Care | Payer: PPO | Source: Ambulatory Visit | Attending: Internal Medicine | Admitting: Internal Medicine

## 2022-09-07 VITALS — BP 128/64 | HR 58 | Wt 131.4 lb

## 2022-09-07 DIAGNOSIS — I130 Hypertensive heart and chronic kidney disease with heart failure and stage 1 through stage 4 chronic kidney disease, or unspecified chronic kidney disease: Secondary | ICD-10-CM | POA: Diagnosis not present

## 2022-09-07 DIAGNOSIS — I480 Paroxysmal atrial fibrillation: Secondary | ICD-10-CM | POA: Diagnosis not present

## 2022-09-07 DIAGNOSIS — E1165 Type 2 diabetes mellitus with hyperglycemia: Secondary | ICD-10-CM | POA: Insufficient documentation

## 2022-09-07 DIAGNOSIS — E1122 Type 2 diabetes mellitus with diabetic chronic kidney disease: Secondary | ICD-10-CM | POA: Diagnosis not present

## 2022-09-07 DIAGNOSIS — R1310 Dysphagia, unspecified: Secondary | ICD-10-CM | POA: Diagnosis not present

## 2022-09-07 DIAGNOSIS — E875 Hyperkalemia: Secondary | ICD-10-CM

## 2022-09-07 DIAGNOSIS — I5082 Biventricular heart failure: Secondary | ICD-10-CM | POA: Insufficient documentation

## 2022-09-07 DIAGNOSIS — N1831 Chronic kidney disease, stage 3a: Secondary | ICD-10-CM | POA: Insufficient documentation

## 2022-09-07 DIAGNOSIS — R627 Adult failure to thrive: Secondary | ICD-10-CM | POA: Diagnosis not present

## 2022-09-07 DIAGNOSIS — I2510 Atherosclerotic heart disease of native coronary artery without angina pectoris: Secondary | ICD-10-CM | POA: Insufficient documentation

## 2022-09-07 DIAGNOSIS — I428 Other cardiomyopathies: Secondary | ICD-10-CM | POA: Insufficient documentation

## 2022-09-07 DIAGNOSIS — G4733 Obstructive sleep apnea (adult) (pediatric): Secondary | ICD-10-CM | POA: Diagnosis not present

## 2022-09-07 DIAGNOSIS — Z955 Presence of coronary angioplasty implant and graft: Secondary | ICD-10-CM | POA: Insufficient documentation

## 2022-09-07 DIAGNOSIS — I252 Old myocardial infarction: Secondary | ICD-10-CM | POA: Insufficient documentation

## 2022-09-07 DIAGNOSIS — Z681 Body mass index (BMI) 19 or less, adult: Secondary | ICD-10-CM | POA: Diagnosis not present

## 2022-09-07 DIAGNOSIS — I5022 Chronic systolic (congestive) heart failure: Secondary | ICD-10-CM | POA: Diagnosis not present

## 2022-09-07 LAB — BASIC METABOLIC PANEL
Anion gap: 16 — ABNORMAL HIGH (ref 5–15)
BUN: 36 mg/dL — ABNORMAL HIGH (ref 8–23)
CO2: 28 mmol/L (ref 22–32)
Calcium: 9.3 mg/dL (ref 8.9–10.3)
Chloride: 93 mmol/L — ABNORMAL LOW (ref 98–111)
Creatinine, Ser: 1.44 mg/dL — ABNORMAL HIGH (ref 0.61–1.24)
GFR, Estimated: 49 mL/min — ABNORMAL LOW (ref >60)
Glucose, Bld: 149 mg/dL — ABNORMAL HIGH (ref 70–99)
Potassium: 4.5 mmol/L (ref 3.5–5.1)
Sodium: 137 mmol/L (ref 135–145)

## 2022-09-07 LAB — BRAIN NATRIURETIC PEPTIDE: B Natriuretic Peptide: 584.6 pg/mL — ABNORMAL HIGH (ref 0.0–100.0)

## 2022-09-07 MED ORDER — EMPAGLIFLOZIN 10 MG PO TABS: 10.0000 mg | ORAL_TABLET | Freq: Every day | ORAL | 11 refills | Status: DC

## 2022-09-07 MED ORDER — APIXABAN 2.5 MG PO TABS: 2.5000 mg | ORAL_TABLET | Freq: Two times a day (BID) | ORAL | 11 refills | Status: AC

## 2022-09-07 NOTE — Patient Instructions (Signed)
Medication Changes:  STOP Plavix  START Jardiance 10 mg Daily  Decrease Eliquis to 2.5 mg Twice daily   Lab Work:  Labs done today, your results will be available in MyChart, we will contact you for abnormal readings.  Testing/Procedures:  none  Referrals:  none  Special Instructions // Education:  Do the following things EVERYDAY: Weigh yourself in the morning before breakfast. Write it down and keep it in a log. Take your medicines as prescribed Eat low salt foods--Limit salt (sodium) to 2000 mg per day.  Stay as active as you can everyday Limit all fluids for the day to less than 2 liters   Follow-Up in: 6 months (Feb 2025), **PLEASE CALL OUR OFFICE IN DECEMBER TO SCHEDULE THIS APPOINTMENT   At the [facility], you and your health needs are our priority. We have a designated team specialized in the treatment of Heart Failure. This Care Team includes your primary Heart Failure Specialized Cardiologist (physician), Advanced Practice Providers (APPs- Physician Assistants and Nurse Practitioners), and Pharmacist who all work together to provide you with the care you need, when you need it.   You may see any of the following providers on your designated Care Team at your next follow up:  Dr. Arvilla Meres Dr. Marca Ancona Dr. Marcos Eke, NP Robbie Lis, [place] [facility] [place], [place] Brynda Peon, NP Karle Plumber, PharmD   Please be sure to bring in all your medications bottles to every appointment.   Need to Contact us:  If you have any questions or concerns before your next appointment please send [place] a message through [place] or call our office at [phone].    TO LEAVE A MESSAGE FOR THE NURSE SELECT OPTION 2, PLEASE LEAVE A MESSAGE INCLUDING: YOUR NAME DATE OF BIRTH CALL BACK NUMBER REASON FOR CALL**this is important as we prioritize the call backs  YOU WILL RECEIVE A CALL BACK THE SAME DAY AS LONG AS YOU  CALL BEFORE 4:00 PM

## 2022-09-07 NOTE — Progress Notes (Signed)
ADVANCED HF CLINIC NOTE   PCP: Richardean Chimera, MD Sleep Cardiology: Dr Mayford Knife  HF Cardiologist: Dr Gala Romney  HPI: 82 y.o. male with hx uncontrolled DM II, HTN, HLD. Admitted 09/17/21 with late presenting inferolateral STEMI with RV involvement c/b cardiogenic shock in setting of RV failure. Found to have proximal occlusion of RCA and 75% d LM/ostial LAD. Underwent emergent PCI/DES to RCA. Staged PCI LM/LAD to be planned at a later date. EF ~ 45%.    Hemodynamics c/w low-output RV failure. Initially on Milrinone for inotrope support, later switched to DBA/NE d/t persistent low-output. Developed AKI and shock liver.  Course c/b paroxysmal AF. Loaded with IV amio, in and out of a fib. Rate controlled.  Amio loading to be continued after discharge.  Zio 10/05/21 io 29 % A flutter.   He was seen in the HF clinic 10/05/21 and was feeling bad due to fatigue. K 6.3, SCr 1.5. Dig , spiro, and losartan stopped. Given Lokelma.    Follow up 11/23, NYHA III and volume ok. Main issue was FTT, GI working up. Called cardiology after hours 12/23 with c/o SOB and leg swelling. Advised increase Lasix dose for a few days then use PRN.  Echo 03/16/22: EF 30-35% RV mod HK  Follow up 04/16/22, volume overload R>L with NYHA IIIb-IV. Instructed to use Furoscix 80 x 3 days, Marcelline Deist stopped with UTIs.   At last visit breathing better. Using PRN Furoscix.   Here with his family for routine f/u Says he can do ADLs but says it takes him a long time. Feels breathless at times but it is improved. Weight stable 125-129. No edema, CP, orthopnea or PND. Family thinks he has improved greatly since March    ROS: All systems negative except as listed in HPI, PMH and Problem List.  SH:  Social History   Socioeconomic History   Marital status: Married    Spouse name: Not on file   Number of children: Not on file   Years of education: Not on file   Highest education level: Not on file  Occupational History   Not on  file  Tobacco Use   Smoking status: Never   Smokeless tobacco: Never  Vaping Use   Vaping status: Never Used  Substance and Sexual Activity   Alcohol use: Never   Drug use: Never   Sexual activity: Not on file  Other Topics Concern   Not on file  Social History Narrative   Not on file   Social Determinants of Health   Financial Resource Strain: Not on file  Food Insecurity: No Food Insecurity (09/23/2021)   Hunger Vital Sign    Worried About Running Out of Food in the Last Year: Never true    Ran Out of Food in the Last Year: Never true  Transportation Needs: No Transportation Needs (09/23/2021)   PRAPARE - Administrator, Civil Service (Medical): No    Lack of Transportation (Non-Medical): No  Physical Activity: Not on file  Stress: Not on file  Social Connections: Not on file  Intimate Partner Violence: Not At Risk (09/23/2021)   Humiliation, Afraid, Rape, and Kick questionnaire    Fear of Current or Ex-Partner: No    Emotionally Abused: No    Physically Abused: No    Sexually Abused: No   FH: History reviewed. No pertinent family history.  Past Medical History:  Diagnosis Date   Cancer [facility])    prostate   Diabetes mellitus  without complication (HCC)    Hypertension    Current Outpatient Medications  Medication Sig Dispense Refill   amiodarone (PACERONE) 200 MG tablet Take 100 mg by mouth daily.     clopidogrel (PLAVIX) 75 MG tablet TAKE ONE (1) TABLET BY MOUTH EVERY DAY 30 tablet 5   dorzolamide (TRUSOPT) 2 % ophthalmic solution Place 1 drop into both eyes in the morning.     DULoxetine (CYMBALTA) 30 MG capsule Take 30 mg by mouth in the morning.     ELIQUIS 5 MG TABS tablet TAKE ONE (1) TABLET BY MOUTH TWO (2) TIMES DAILY 60 tablet 5   insulin glargine (LANTUS) 100 UNIT/ML injection Inject 8-20 Units into the skin at bedtime.     latanoprost (XALATAN) 0.005 % ophthalmic solution Place 1 drop into both eyes at bedtime.     metFORMIN (GLUCOPHAGE) 850 MG  tablet Take 850 mg by mouth in the morning and at bedtime. (Morning & afternoon)     ondansetron (ZOFRAN) 8 MG tablet Take 8 mg by mouth as needed for nausea or vomiting.     pantoprazole (PROTONIX) 40 MG tablet Take 1 tablet (40 mg total) by mouth 2 (two) times daily. 90 tablet 3   potassium chloride SA (KLOR-CON M) 20 MEQ tablet Take 1 tablet (20 mEq total) by mouth daily. 90 tablet 3   propranolol (INDERAL) 10 MG tablet Take 20 mg by mouth 2 (two) times daily.     torsemide (DEMADEX) 20 MG tablet Take 2 tablets (40 mg total) by mouth daily. 180 tablet 3   No current facility-administered medications for this encounter.   BP 128/64   Pulse (!) 58   Wt 59.6 kg (131 lb 6.4 oz)   SpO2 99%   BMI 19.40 kg/m   Wt Readings from Last 3 Encounters:  09/07/22 59.6 kg (131 lb 6.4 oz)  04/23/22 55.3 kg (122 lb)  04/16/22 65 kg (143 lb 6.4 oz)   PHYSICAL EXAM: General:  Elderly thin. No resp difficulty HEENT: normal Neck: supple. no JVD. Carotids 2+ bilat; no bruits. No lymphadenopathy or thryomegaly appreciated. Cor: PMI nondisplaced. Regular rate & rhythm. No rubs, gallops or murmurs. Lungs: clear Abdomen: soft, nontender, nondistended. No hepatosplenomegaly. No bruits or masses. Good bowel sounds. Extremities: no cyanosis, clubbing, rash, edema Neuro: alert & orientedx3, cranial nerves grossly intact. moves all 4 extremities w/o difficulty. Affect pleasant  ASSESSMENT & PLAN:   1. 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 Required inotrope support with DBA. - Echo (9/23): EF 30-35% RV moderately reduced trivial MR - Echo 03/16/22: EF 30-35% RV mod HK - Overall improved NYHA II-III. - Volume status looks good. Continue torsemide 40 daily + KCl 20 daily. Can use Furoscix PRN - Continue propranolol 10 mg bid. (Hx tremor)  - No spiro/Entresto with recent hyperK - Off Farxiga due to UTIs. He is willing  to try Jardiance 10 - Not candidate for ICD with comorbidities - Labs today.  2. 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 Can stop Plavix -Continue Eliquis but drop to 2.5 bid with age > 80 and Weight < 60kg - Previously discussed PCI LM/LAD but doubt this is contributing to his current condition as LM stenosis not critical and PCI would be high risk.  -  Continue medical management for now  3. Failure to thrive/anorexia/dysphagia - Severe. Concern for stricture/ No improvement with PPI - Has seen GI and suggested EGD but waiting for timing after MI. - Symptoms now much improved. Appetite and swallowing improved  4. DM2, poorly controlled - a1c 9.5% in 8/23 - On metformin and insulin - Planning to establish with Endocrine - Start Jardiance today  5. PAF/AFL - 3 day zio monitor (9/23). AFL burden 29%. - Regular today. - Continue amio 100 mg daily. - Decrease Eliquis to 2.5 bid  6. Hyperkalemia  - K has been running 4.5-4.8 lately (improved) - BMET today.  7. OSA - Unable to wear CPAP. May be contributing to fatigue. - In the past he saw Dr Mayford Knife.  - Encouraged he try to wear CPAP.  8. CKD 3a - Start jardiance   Arvilla Meres MD 12:00 PM

## 2022-09-07 NOTE — Telephone Encounter (Signed)
Advanced Heart Failure Patient Advocate Encounter  The patient was approved for a [employer] grant that will help cover the cost of Jardiance. Total amount awarded, $10,000. Eligibility, 08/08/22 - 08/07/23.  ID [id number]  BIN [id number]  PCN [id number]  Group [id number]  Patient was given a copy in clinic, grant information attached to rx as well.   Archer Asa, CPhT

## 2022-09-28 DIAGNOSIS — E1165 Type 2 diabetes mellitus with hyperglycemia: Secondary | ICD-10-CM | POA: Diagnosis not present

## 2022-09-28 DIAGNOSIS — Z1329 Encounter for screening for other suspected endocrine disorder: Secondary | ICD-10-CM | POA: Diagnosis not present

## 2022-09-28 DIAGNOSIS — E7849 Other hyperlipidemia: Secondary | ICD-10-CM | POA: Diagnosis not present

## 2022-09-28 DIAGNOSIS — K219 Gastro-esophageal reflux disease without esophagitis: Secondary | ICD-10-CM | POA: Diagnosis not present

## 2022-09-28 DIAGNOSIS — Z0000 Encounter for general adult medical examination without abnormal findings: Secondary | ICD-10-CM | POA: Diagnosis not present

## 2022-09-28 DIAGNOSIS — Z1321 Encounter for screening for nutritional disorder: Secondary | ICD-10-CM | POA: Diagnosis not present

## 2022-09-28 DIAGNOSIS — E782 Mixed hyperlipidemia: Secondary | ICD-10-CM | POA: Diagnosis not present

## 2022-09-28 DIAGNOSIS — D649 Anemia, unspecified: Secondary | ICD-10-CM | POA: Diagnosis not present

## 2022-10-05 DIAGNOSIS — J61 Pneumoconiosis due to asbestos and other mineral fibers: Secondary | ICD-10-CM | POA: Diagnosis not present

## 2022-10-05 DIAGNOSIS — Z23 Encounter for immunization: Secondary | ICD-10-CM | POA: Diagnosis not present

## 2022-10-05 DIAGNOSIS — D510 Vitamin B12 deficiency anemia due to intrinsic factor deficiency: Secondary | ICD-10-CM | POA: Diagnosis not present

## 2022-10-05 DIAGNOSIS — E7849 Other hyperlipidemia: Secondary | ICD-10-CM | POA: Diagnosis not present

## 2022-10-05 DIAGNOSIS — J440 Chronic obstructive pulmonary disease with acute lower respiratory infection: Secondary | ICD-10-CM | POA: Diagnosis not present

## 2022-10-05 DIAGNOSIS — D692 Other nonthrombocytopenic purpura: Secondary | ICD-10-CM | POA: Diagnosis not present

## 2022-10-05 DIAGNOSIS — Z0001 Encounter for general adult medical examination with abnormal findings: Secondary | ICD-10-CM | POA: Diagnosis not present

## 2022-10-05 DIAGNOSIS — E1165 Type 2 diabetes mellitus with hyperglycemia: Secondary | ICD-10-CM | POA: Diagnosis not present

## 2022-10-05 DIAGNOSIS — E440 Moderate protein-calorie malnutrition: Secondary | ICD-10-CM | POA: Diagnosis not present

## 2022-10-05 DIAGNOSIS — K5790 Diverticulosis of intestine, part unspecified, without perforation or abscess without bleeding: Secondary | ICD-10-CM | POA: Diagnosis not present

## 2022-10-05 DIAGNOSIS — I10 Essential (primary) hypertension: Secondary | ICD-10-CM | POA: Diagnosis not present

## 2022-10-05 DIAGNOSIS — E538 Deficiency of other specified B group vitamins: Secondary | ICD-10-CM | POA: Diagnosis not present

## 2022-10-07 IMAGING — MR MR ABDOMEN WO/W CM
20 series · 48 of 48 positions shown · IV contrast (gadavist)
Comparison: CT abdomen and pelvis 02/02/2021

CLINICAL DATA: Indeterminate renal mass

EXAM:
MRI ABDOMEN WITHOUT AND WITH CONTRAST
TECHNIQUE: Multiplanar multisequence MR imaging of the abdomen was performed
both before and after the administration of intravenous contrast.
CONTRAST:  7mL GADAVIST GADOBUTROL 1 MMOL/ML IV SOLN

[Series 3: cor haste · coronal · B · 6.0mm · 1.25mm/px · 2 of 26 slices shown]
[im 1/26]
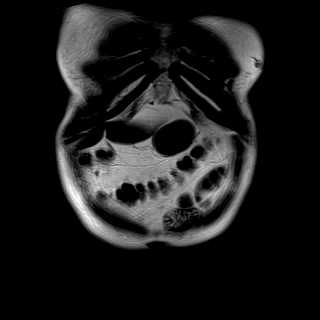
[im 26/26]
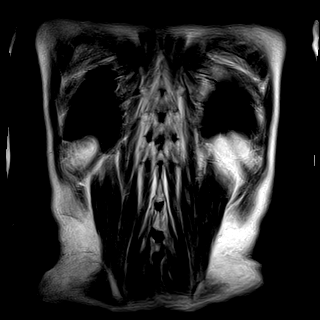

[Series 6: T2 fat-sat · axial · B · 6.0mm · 1.19mm/px · z∈[-152,+86]mm · 2 of 34 slices shown]
[im 1/34]
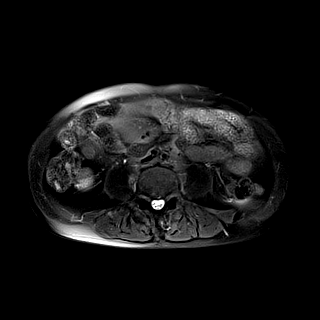
[im 34/34]
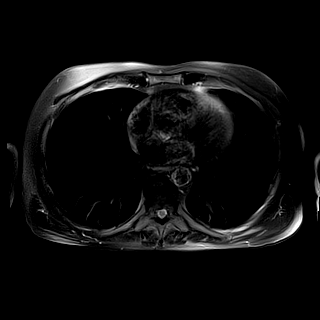

[Series 7: DWI · axial · B · 6.0mm · 1.42mm/px · z∈[-138,+71]mm · 2 of 30 slices shown (1 of 4)]
[im 1/30]
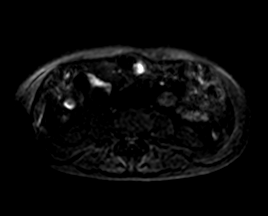
[im 30/30]
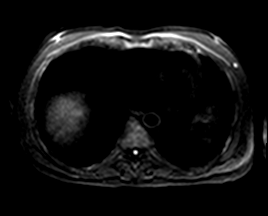

[Series 7: DWI · axial · B · 6.0mm · 1.42mm/px · 1 of 30 slices shown (2 of 4)]
[im 1/30]
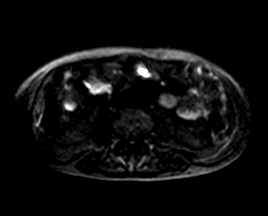

[Series 7: DWI · axial · B · 6.0mm · 1.42mm/px · 1 of 30 slices shown (3 of 4)]
[im 1/30]
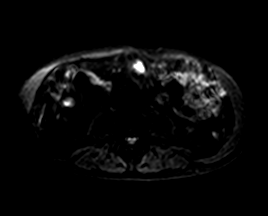

[Series 8: DWI · axial · B · 6.0mm · 1.42mm/px · 1 of 30 slices shown (4 of 4)]
[im 1/30]
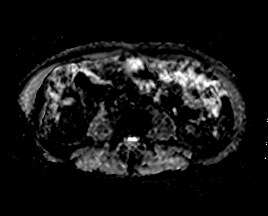

[Series 9: ax in and · axial · B · 3.0mm · 1.19mm/px · z∈[-169,+68]mm · 3 of 80 slices shown (1 of 2)]
[im 1/80]
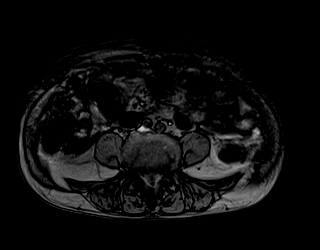
[im 40/80]
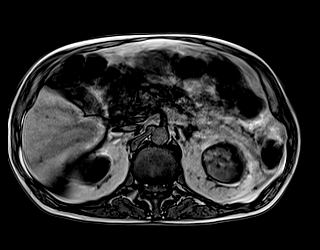
[im 80/80]
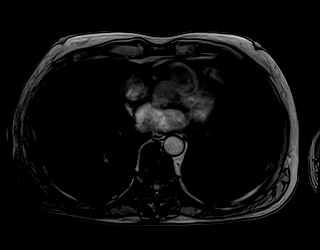

[Series 10: ax in and · axial · B · 3.0mm · 1.19mm/px · z∈[-169,+68]mm · 3 of 80 slices shown (2 of 2)]
[im 1/80]
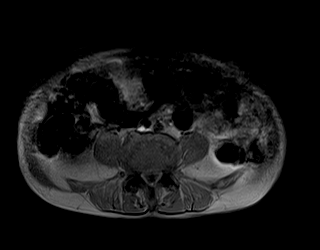
[im 40/80]
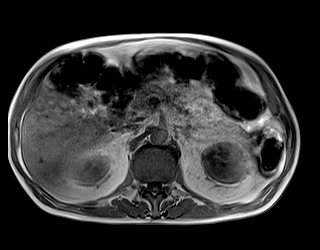
[im 80/80]
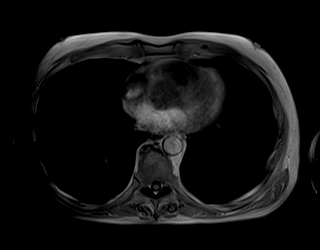

[Series 11: ax haste bh · axial · B · 6.0mm · 1.19mm/px · 1 of 34 slices shown]
[im 1/34]
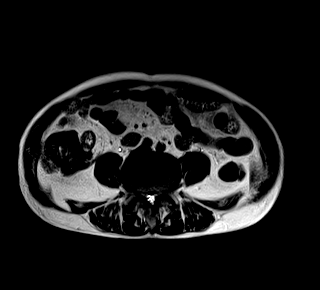

[Series 12: bSSFP · axial · B · 6.0mm · 0.74mm/px · z∈[-174,+108]mm · 2 of 48 slices shown]
[im 1/48]
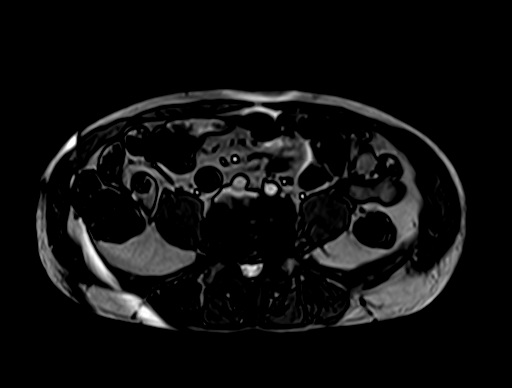
[im 48/48]
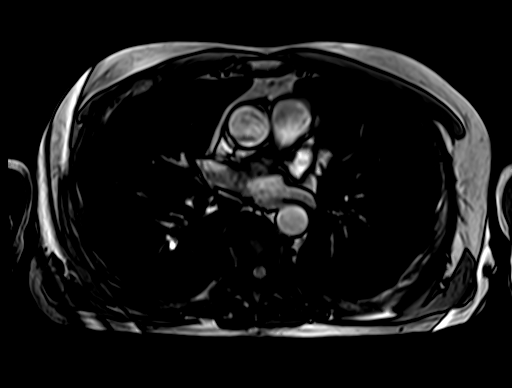

[Series 13: T1 dynamic · axial · B · 3.0mm · 1.19mm/px · z∈[-169,+68]mm · 3 of 80 slices shown (1 of 9)]
[im 1/80]
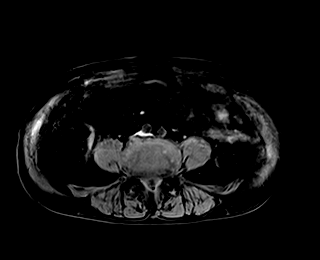
[im 40/80]
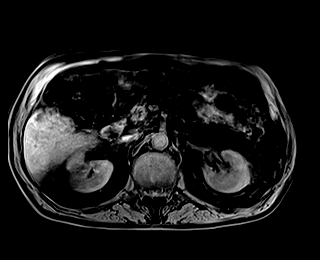
[im 80/80]
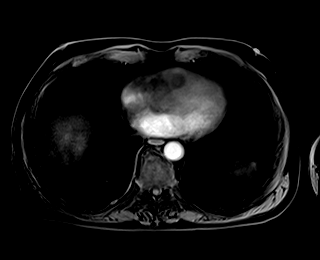

[Series 15: T1 dynamic · axial · B · 3.0mm · 1.19mm/px · z∈[-169,+68]mm · 3 of 80 slices shown (2 of 9)]
[im 1/80]
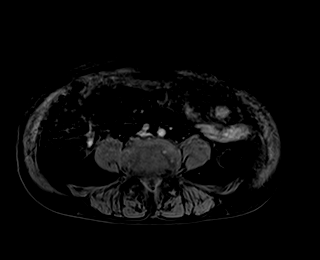
[im 40/80]
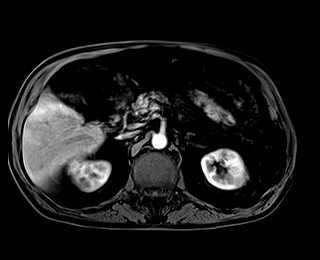
[im 80/80]
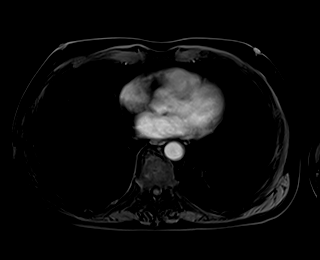

[Series 16: T1 dynamic · axial · B · 3.0mm · 1.19mm/px · z∈[-169,+68]mm · 3 of 80 slices shown (3 of 9)]
[im 1/80]
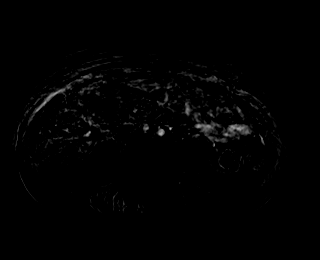
[im 40/80]
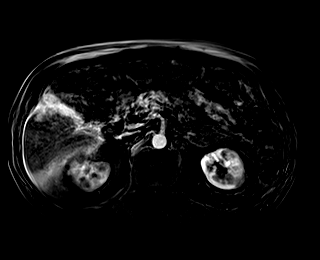
[im 80/80]
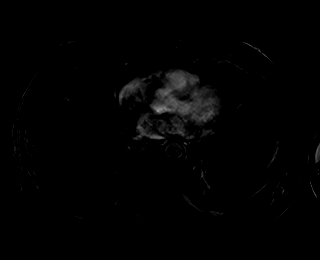

[Series 17: T1 dynamic · axial · B · 3.0mm · 1.19mm/px · z∈[-169,+68]mm · 3 of 80 slices shown (4 of 9)]
[im 1/80]
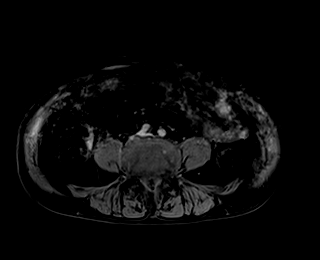
[im 40/80]
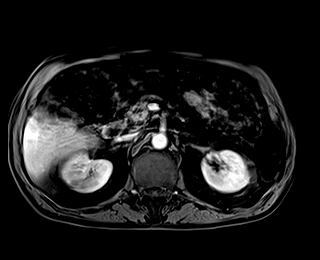
[im 80/80]
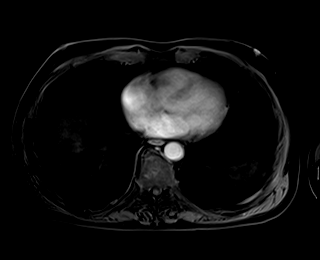

[Series 18: T1 dynamic · axial · B · 3.0mm · 1.19mm/px · z∈[-169,+68]mm · 3 of 80 slices shown (5 of 9)]
[im 1/80]
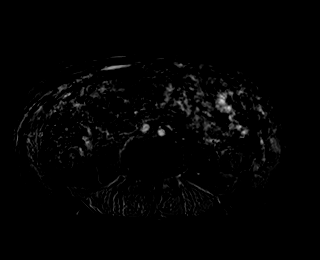
[im 40/80]
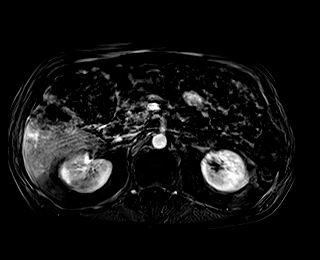
[im 80/80]
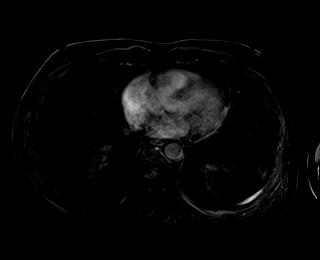

[Series 19: T1 dynamic · axial · B · 3.0mm · 1.19mm/px · z∈[-169,+68]mm · 3 of 80 slices shown (6 of 9)]
[im 1/80]
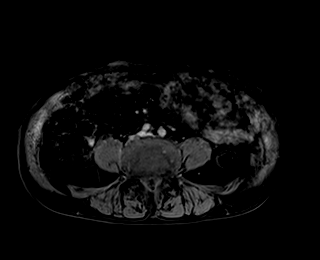
[im 40/80]
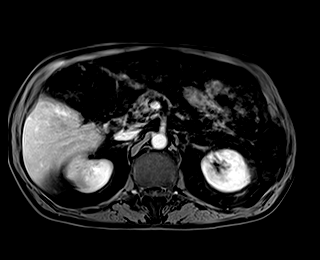
[im 80/80]
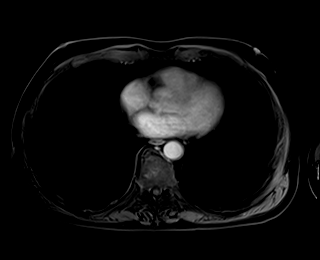

[Series 20: T1 dynamic · axial · B · 3.0mm · 1.19mm/px · z∈[-169,+68]mm · 3 of 80 slices shown (7 of 9)]
[im 1/80]
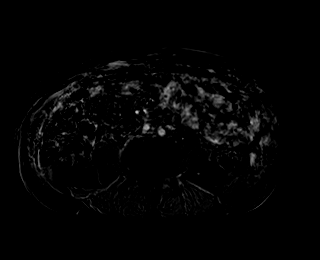
[im 40/80]
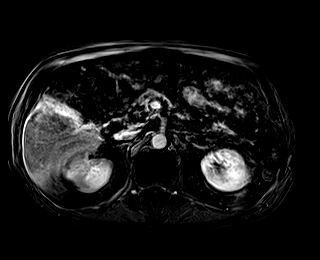
[im 80/80]
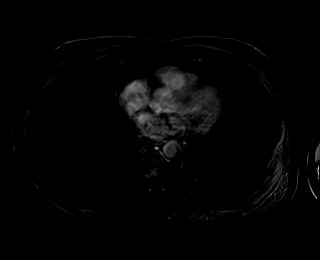

[Series 21: T1 dynamic · axial · B · 3.0mm · 1.19mm/px · z∈[-169,+68]mm · 3 of 80 slices shown (8 of 9)]
[im 1/80]
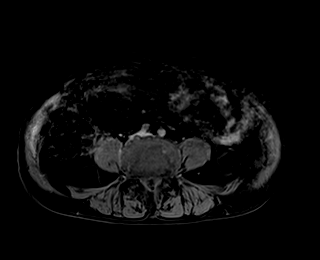
[im 40/80]
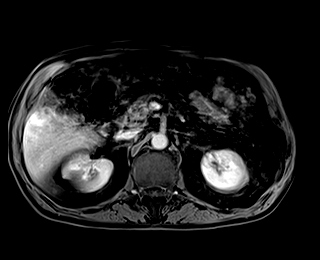
[im 80/80]
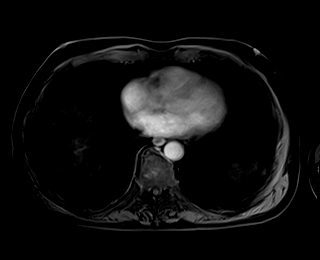

[Series 22: T1 dynamic · axial · B · 3.0mm · 1.19mm/px · z∈[-169,+68]mm · 3 of 80 slices shown (9 of 9)]
[im 1/80]
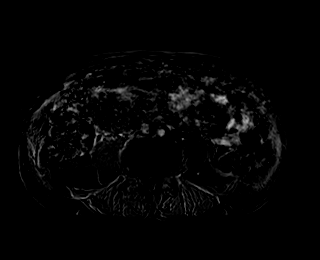
[im 40/80]
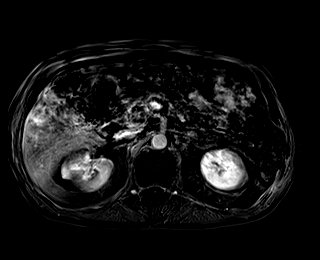
[im 80/80]
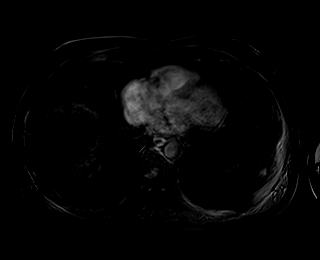

[Series 23: T1 dynamic post-contrast · coronal · B · 3.0mm · 1.31mm/px · 3 of 72 slices shown]
[im 1/72]
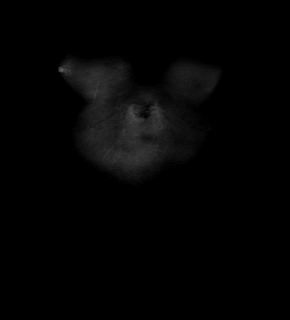
[im 36/72]
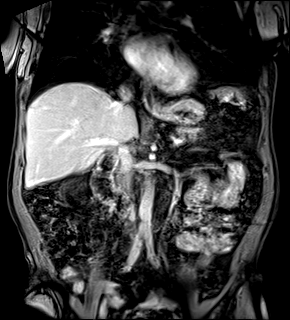
[im 72/72]
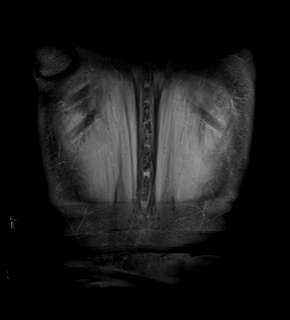

[48 of 48 positions shown; findings below may reference images not displayed]

FINDINGS: Study is limited due to motion.

Lower chest: No acute findings.

Hepatobiliary: Liver is normal in size and contour with no
suspicious mass visualized. No evidence of hepatic steatosis.
Gallbladder appears within normal limits. No biliary ductal
dilatation identified.

Pancreas: 5 mm cystic focus in the body of the pancreas which
possibly communicates with the pancreatic duct, limited
visualization due to motion. Pancreas appears otherwise normal. Main
pancreatic duct is normal caliber.

Spleen:  Within normal limits in size and appearance.

Adrenals/Urinary Tract: Adrenal glands are normal. A few renal
cortical cysts identified bilaterally measuring up to 1.4 cm
posteriorly on the right and 2.4 cm laterally on the left. There are
patchy areas of ill-defined hypoenhancement in the bilateral kidneys
with corresponding hyperintense DWI signal, largest area in the
upper left kidney, most consistent with pyelonephritis. This
includes the previously questioned area on CT in the mid right
kidney as well. No hydronephrosis.

Stomach/Bowel: No evidence of bowel obstruction. Colonic
diverticulosis.

Vascular/Lymphatic: No pathologically enlarged lymph nodes
identified. No abdominal aortic aneurysm demonstrated.

Other:  No ascites.

Musculoskeletal: No suspicious bone lesions identified.
IMPRESSION: 1. Abnormal appearance of the kidneys as described, consistent with
acute/persistent pyelonephritis.
2. Bilateral renal cysts.
3. Colonic diverticulosis.
4. 5 mm cystic focus in the body of the pancreas most likely a cyst
or side branch IPMN. Consider follow-up MRI in 12 months.

Report will be called by the GAYE.

## 2022-11-05 DIAGNOSIS — Z20828 Contact with and (suspected) exposure to other viral communicable diseases: Secondary | ICD-10-CM | POA: Diagnosis not present

## 2022-11-05 DIAGNOSIS — E1165 Type 2 diabetes mellitus with hyperglycemia: Secondary | ICD-10-CM | POA: Diagnosis not present

## 2022-11-05 DIAGNOSIS — J189 Pneumonia, unspecified organism: Secondary | ICD-10-CM | POA: Diagnosis not present

## 2022-11-05 DIAGNOSIS — R509 Fever, unspecified: Secondary | ICD-10-CM | POA: Diagnosis not present

## 2022-11-05 DIAGNOSIS — R5383 Other fatigue: Secondary | ICD-10-CM | POA: Diagnosis not present

## 2022-11-05 DIAGNOSIS — R030 Elevated blood-pressure reading, without diagnosis of hypertension: Secondary | ICD-10-CM | POA: Diagnosis not present

## 2022-11-05 DIAGNOSIS — Z681 Body mass index (BMI) 19 or less, adult: Secondary | ICD-10-CM | POA: Diagnosis not present

## 2023-01-17 ENCOUNTER — Other Ambulatory Visit (HOSPITAL_COMMUNITY): Payer: Self-pay | Admitting: Internal Medicine

## 2023-02-03 DIAGNOSIS — E782 Mixed hyperlipidemia: Secondary | ICD-10-CM | POA: Diagnosis not present

## 2023-02-03 DIAGNOSIS — E1165 Type 2 diabetes mellitus with hyperglycemia: Secondary | ICD-10-CM | POA: Diagnosis not present

## 2023-02-03 DIAGNOSIS — R5383 Other fatigue: Secondary | ICD-10-CM | POA: Diagnosis not present

## 2023-02-03 DIAGNOSIS — I10 Essential (primary) hypertension: Secondary | ICD-10-CM | POA: Diagnosis not present

## 2023-02-03 DIAGNOSIS — E7849 Other hyperlipidemia: Secondary | ICD-10-CM | POA: Diagnosis not present

## 2023-02-09 DIAGNOSIS — F331 Major depressive disorder, recurrent, moderate: Secondary | ICD-10-CM | POA: Diagnosis not present

## 2023-02-09 DIAGNOSIS — E782 Mixed hyperlipidemia: Secondary | ICD-10-CM | POA: Diagnosis not present

## 2023-02-09 DIAGNOSIS — I5030 Unspecified diastolic (congestive) heart failure: Secondary | ICD-10-CM | POA: Diagnosis not present

## 2023-02-09 DIAGNOSIS — Z6821 Body mass index (BMI) 21.0-21.9, adult: Secondary | ICD-10-CM | POA: Diagnosis not present

## 2023-02-09 DIAGNOSIS — I10 Essential (primary) hypertension: Secondary | ICD-10-CM | POA: Diagnosis not present

## 2023-02-10 DIAGNOSIS — C61 Malignant neoplasm of prostate: Secondary | ICD-10-CM | POA: Diagnosis not present

## 2023-02-10 DIAGNOSIS — N5231 Erectile dysfunction following radical prostatectomy: Secondary | ICD-10-CM | POA: Diagnosis not present

## 2023-02-15 ENCOUNTER — Other Ambulatory Visit (HOSPITAL_COMMUNITY): Payer: Self-pay | Admitting: Internal Medicine

## 2023-02-17 DIAGNOSIS — H524 Presbyopia: Secondary | ICD-10-CM | POA: Diagnosis not present

## 2023-02-17 DIAGNOSIS — E119 Type 2 diabetes mellitus without complications: Secondary | ICD-10-CM | POA: Diagnosis not present

## 2023-02-17 DIAGNOSIS — H401131 Primary open-angle glaucoma, bilateral, mild stage: Secondary | ICD-10-CM | POA: Diagnosis not present

## 2023-02-22 ENCOUNTER — Other Ambulatory Visit (HOSPITAL_COMMUNITY): Payer: Self-pay

## 2023-02-22 ENCOUNTER — Telehealth (HOSPITAL_COMMUNITY): Payer: Self-pay | Admitting: Pharmacy Technician

## 2023-02-22 ENCOUNTER — Inpatient Hospital Stay (HOSPITAL_COMMUNITY)
Admission: RE | Admit: 2023-02-22 | Discharge: 2023-02-22 | Disposition: A | Discharge status: Home / Self Care | Payer: HMO | Source: Ambulatory Visit | Attending: Internal Medicine | Admitting: Internal Medicine

## 2023-02-22 ENCOUNTER — Other Ambulatory Visit (HOSPITAL_COMMUNITY): Payer: Self-pay | Admitting: Internal Medicine

## 2023-02-22 ENCOUNTER — Ambulatory Visit (HOSPITAL_COMMUNITY)
Admission: RE | Admit: 2023-02-22 | Discharge: 2023-02-22 | Disposition: A | Discharge status: Home / Self Care | Payer: HMO | Source: Ambulatory Visit | Attending: Internal Medicine | Admitting: Internal Medicine

## 2023-02-22 VITALS — BP 122/60 | HR 58 | Wt 144.6 lb

## 2023-02-22 DIAGNOSIS — G4733 Obstructive sleep apnea (adult) (pediatric): Secondary | ICD-10-CM | POA: Insufficient documentation

## 2023-02-22 DIAGNOSIS — R54 Age-related physical debility: Secondary | ICD-10-CM | POA: Insufficient documentation

## 2023-02-22 DIAGNOSIS — E1122 Type 2 diabetes mellitus with diabetic chronic kidney disease: Secondary | ICD-10-CM | POA: Insufficient documentation

## 2023-02-22 DIAGNOSIS — I2510 Atherosclerotic heart disease of native coronary artery without angina pectoris: Secondary | ICD-10-CM | POA: Diagnosis not present

## 2023-02-22 DIAGNOSIS — Z794 Long term (current) use of insulin: Secondary | ICD-10-CM | POA: Insufficient documentation

## 2023-02-22 DIAGNOSIS — I130 Hypertensive heart and chronic kidney disease with heart failure and stage 1 through stage 4 chronic kidney disease, or unspecified chronic kidney disease: Secondary | ICD-10-CM | POA: Insufficient documentation

## 2023-02-22 DIAGNOSIS — N1831 Chronic kidney disease, stage 3a: Secondary | ICD-10-CM | POA: Insufficient documentation

## 2023-02-22 DIAGNOSIS — I5022 Chronic systolic (congestive) heart failure: Secondary | ICD-10-CM | POA: Insufficient documentation

## 2023-02-22 DIAGNOSIS — I252 Old myocardial infarction: Secondary | ICD-10-CM | POA: Diagnosis not present

## 2023-02-22 DIAGNOSIS — Z7984 Long term (current) use of oral hypoglycemic drugs: Secondary | ICD-10-CM | POA: Diagnosis not present

## 2023-02-22 DIAGNOSIS — Z79899 Other long term (current) drug therapy: Secondary | ICD-10-CM | POA: Insufficient documentation

## 2023-02-22 DIAGNOSIS — F329 Major depressive disorder, single episode, unspecified: Secondary | ICD-10-CM | POA: Diagnosis not present

## 2023-02-22 DIAGNOSIS — Z955 Presence of coronary angioplasty implant and graft: Secondary | ICD-10-CM | POA: Diagnosis not present

## 2023-02-22 DIAGNOSIS — R1310 Dysphagia, unspecified: Secondary | ICD-10-CM | POA: Diagnosis not present

## 2023-02-22 DIAGNOSIS — Z7901 Long term (current) use of anticoagulants: Secondary | ICD-10-CM | POA: Insufficient documentation

## 2023-02-22 DIAGNOSIS — E875 Hyperkalemia: Secondary | ICD-10-CM | POA: Diagnosis not present

## 2023-02-22 DIAGNOSIS — I4892 Unspecified atrial flutter: Secondary | ICD-10-CM

## 2023-02-22 DIAGNOSIS — R627 Adult failure to thrive: Secondary | ICD-10-CM | POA: Diagnosis not present

## 2023-02-22 DIAGNOSIS — R630 Anorexia: Secondary | ICD-10-CM | POA: Insufficient documentation

## 2023-02-22 DIAGNOSIS — I480 Paroxysmal atrial fibrillation: Secondary | ICD-10-CM | POA: Diagnosis not present

## 2023-02-22 LAB — COMPREHENSIVE METABOLIC PANEL
ALT: 27 U/L (ref 0–44)
AST: 30 U/L (ref 15–41)
Albumin: 3.6 g/dL (ref 3.5–5.0)
Alkaline Phosphatase: 79 U/L (ref 38–126)
Anion gap: 13 (ref 5–15)
BUN: 30 mg/dL — ABNORMAL HIGH (ref 8–23)
CO2: 25 mmol/L (ref 22–32)
Calcium: 9.1 mg/dL (ref 8.9–10.3)
Chloride: 99 mmol/L (ref 98–111)
Creatinine, Ser: 1.86 mg/dL — ABNORMAL HIGH (ref 0.61–1.24)
GFR, Estimated: 36 mL/min — ABNORMAL LOW (ref >60)
Glucose, Bld: 154 mg/dL — ABNORMAL HIGH (ref 70–99)
Potassium: 4.2 mmol/L (ref 3.5–5.1)
Sodium: 137 mmol/L (ref 135–145)
Total Bilirubin: 0.6 mg/dL (ref 0.0–1.2)
Total Protein: 7.3 g/dL (ref 6.5–8.1)

## 2023-02-22 LAB — T4, FREE: Free T4: 1.14 ng/dL — ABNORMAL HIGH (ref 0.61–1.12)

## 2023-02-22 LAB — BRAIN NATRIURETIC PEPTIDE: B Natriuretic Peptide: 853.4 pg/mL — ABNORMAL HIGH (ref 0.0–100.0)

## 2023-02-22 LAB — TSH: TSH: 1.394 u[IU]/mL (ref 0.350–4.500)

## 2023-02-22 MED ORDER — DAPAGLIFLOZIN PROPANEDIOL 5 MG PO TABS: 5.0000 mg | ORAL_TABLET | Freq: Every day | ORAL | 11 refills | Status: DC

## 2023-02-22 NOTE — Patient Instructions (Addendum)
 Great to see you today!!!  Medication Changes:  START Farixga 5 mg Daily  Lab Work:  Labs done today, your results will be available in MyChart, we will contact you for abnormal readings.  Testing/Procedures:  Your physician has requested that you have an echocardiogram. Echocardiography is a painless test that uses sound waves to create images of your heart. It provides your doctor with information about the size and shape of your heart and how well your heart's chambers and valves are working. This procedure takes approximately one hour. There are no restrictions for this procedure. Please do NOT wear cologne, perfume, aftershave, or lotions (deodorant is allowed). Please arrive 15 minutes prior to your appointment time.  Please note: We ask at that you not bring children with you during ultrasound (echo/ vascular) testing. Due to room size and safety concerns, children are not allowed in the ultrasound rooms during exams. Our front office staff cannot provide observation of children in our lobby area while testing is being conducted. An adult accompanying a patient to their appointment will only be allowed in the ultrasound room at the discretion of the ultrasound technician under special circumstances. We apologize for any inconvenience.   Your provider has recommended that  you wear a Zio Patch for 7 days.  This monitor will record your heart rhythm for our review.  IF you have any symptoms while wearing the monitor please press the button.  If you have any issues with the patch or you notice a red or orange light on it please call the company at [phone].  Once you remove the patch please mail it back to the company as soon as possible so we can get the results.   Special Instructions // Education:  Do the following things EVERYDAY: Weigh yourself in the morning before breakfast. Write it down and keep it in a log. Take your medicines as prescribed Eat low salt foods--Limit  salt (sodium) to 2000 mg per day.  Stay as active as you can everyday Limit all fluids for the day to less than 2 liters   Follow-Up in: 3 months   At the [facility], you and your health needs are our priority. We have a designated team specialized in the treatment of Heart Failure. This Care Team includes your primary Heart Failure Specialized Cardiologist (physician), Advanced Practice Providers (APPs- Physician Assistants and Nurse Practitioners), and Pharmacist who all work together to provide you with the care you need, when you need it.   You may see any of the following providers on your designated Care Team at your next follow up:  Dr. Toribio Fuel Dr. Ezra Shuck Dr. Ria Commander Dr. Odis Brownie Greig Mosses, NP Caffie Shed, [place] [facility] [place], [place] Beckey Coe, NP Jordan Lee, NP Tinnie Redman, PharmD   Please be sure to bring in all your medications bottles to every appointment.   Need to Contact Us :  If you have any questions or concerns before your next appointment please send us  a message through Gamaliel or call our office at [phone].    TO LEAVE A MESSAGE FOR THE NURSE SELECT OPTION 2, PLEASE LEAVE A MESSAGE INCLUDING: YOUR NAME DATE OF BIRTH CALL BACK NUMBER REASON FOR CALL**this is important as we prioritize the call backs  YOU WILL RECEIVE A CALL BACK THE SAME DAY AS LONG AS YOU CALL BEFORE 4:00 PM

## 2023-02-22 NOTE — Telephone Encounter (Signed)
 Pharmacy Patient Advocate Encounter  Insurance verification completed.   The patient is insured through [facility] ADVANTAGE/RX ADVANCE  Ran test claim for Farxiga  5mg . Currently a quantity of 90 is a 90 day supply and the co-pay is $117.50. Quantity 30 is a 30 day supply and the co-pay is $47.  This test claim was processed through [facility]- copay amounts may vary at other pharmacies due to pharmacy/plan contracts, or as the patient moves through the different stages of their insurance plan.   Almarie JULIANNA Pa, CPhT

## 2023-02-22 NOTE — Progress Notes (Signed)
 ADVANCED HF CLINIC NOTE   PCP: Toribio Jerel MATSU, MD Sleep Cardiology: Dr Shlomo  HF Cardiologist: Dr Cherrie  HPI: 83 y.o. male with hx uncontrolled DM II, HTN, HLD. Admitted 09/17/21 with late presenting inferolateral STEMI with RV involvement c/b cardiogenic shock in setting of RV failure. Found to have proximal occlusion of RCA and 75% d LM/ostial LAD. Underwent emergent PCI/DES to RCA. Staged PCI LM/LAD to be planned at a later date. EF ~ 45%.    Hemodynamics c/w low-output RV failure. Initially on Milrinone  for inotrope support, later switched to DBA/NE d/t persistent low-output. Developed AKI and shock liver.  Course c/b paroxysmal AF. Loaded with IV amio, in and out of a fib. Rate controlled.  Amio loading to be continued after discharge.  Zio 10/05/21 io 29 % A flutter.   He was seen in the HF clinic 10/05/21 and was feeling bad due to fatigue. K 6.3, SCr 1.5. Dig , spiro, and losartan  stopped. Given Lokelma .    Follow up 11/23, NYHA III and volume ok. Main issue was FTT, GI working up. Called cardiology after hours 12/23 with c/o SOB and leg swelling. Advised increase Lasix  dose for a few days then use PRN.  Echo 03/16/22: EF 30-35% RV mod HK  Follow up 04/16/22, volume overload R>L with NYHA IIIb-IV. Instructed to use Furoscix  80 x 3 days, Farxiga  stopped with UTIs.   Here with his wife for f/u. Says he feels ok. Wife says he is very depressed. Complains that everything taste likes grease. Not very active due to cold weather. Trying to force himself to walk. Still SOB with mild activity. No edema, orthopnea or PND. Very rare CP. Weight nadired at 116. Now back up to 140 pounds. Says he is worried about his heart. Off Jardaince due to weight loss    ROS: All systems negative except as listed in HPI, PMH and Problem List.  SH:  Social History   Socioeconomic History   Marital status: Married    Spouse name: Not on file   Number of children: Not on file   Years of  education: Not on file   Highest education level: Not on file  Occupational History   Not on file  Tobacco Use   Smoking status: Never   Smokeless tobacco: Never  Vaping Use   Vaping status: Never Used  Substance and Sexual Activity   Alcohol  use: Never   Drug use: Never   Sexual activity: Not on file  Other Topics Concern   Not on file  Social History Narrative   Not on file   Social Drivers of Health   Financial Resource Strain: Not on file  Food Insecurity: No Food Insecurity (09/23/2021)   Hunger Vital Sign    Worried About Running Out of Food in the Last Year: Never true    Ran Out of Food in the Last Year: Never true  Transportation Needs: No Transportation Needs (09/23/2021)   PRAPARE - Administrator, Civil Service (Medical): No    Lack of Transportation (Non-Medical): No  Physical Activity: Not on file  Stress: Not on file  Social Connections: Not on file  Intimate Partner Violence: Not At Risk (09/23/2021)   Humiliation, Afraid, Rape, and Kick questionnaire    Fear of Current or Ex-Partner: No    Emotionally Abused: No    Physically Abused: No    Sexually Abused: No   FH: No family history on file.  Past Medical History:  Diagnosis Date   Cancer [facility])    prostate   Diabetes mellitus without complication (HCC)    Hypertension    Current Outpatient Medications  Medication Sig Dispense Refill   amiodarone  (PACERONE ) 200 MG tablet Take 100 mg by mouth daily.     apixaban  (ELIQUIS ) 2.5 MG TABS tablet Take 1 tablet (2.5 mg total) by mouth 2 (two) times daily. 60 tablet 11   dorzolamide  (TRUSOPT ) 2 % ophthalmic solution Place 1 drop into both eyes in the morning.     FLUoxetine (PROZAC) 10 MG capsule Take 10 mg by mouth daily.     insulin  glargine (LANTUS) 100 UNIT/ML injection Inject 8-20 Units into the skin at bedtime.     latanoprost  (XALATAN ) 0.005 % ophthalmic solution Place 1 drop into both eyes at bedtime.     metFORMIN (GLUCOPHAGE) 850 MG tablet  Take 850 mg by mouth in the morning and at bedtime. (Morning & afternoon)     ondansetron  (ZOFRAN ) 8 MG tablet Take 8 mg by mouth as needed for nausea or vomiting.     pantoprazole  (PROTONIX ) 40 MG tablet Take 1 tablet (40 mg total) by mouth 2 (two) times daily. 90 tablet 3   potassium chloride  SA (KLOR-CON  M) 20 MEQ tablet TAKE ONE (1) TABLET BY MOUTH EVERY DAY 90 tablet 3   propranolol  (INDERAL ) 10 MG tablet Take 20 mg by mouth 2 (two) times daily.     torsemide  (DEMADEX ) 20 MG tablet TAKE TWO (2) TABLETS BY MOUTH DAILY 180 tablet 3   No current facility-administered medications for this encounter.   BP 122/60   Pulse (!) 58   SpO2 100%   Wt Readings from Last 3 Encounters:  09/07/22 59.6 kg (131 lb 6.4 oz)  04/23/22 55.3 kg (122 lb)  04/16/22 65 kg (143 lb 6.4 oz)   PHYSICAL EXAM: General:  Thin elderly No respiratory difiiculty  HEENT: normal Neck: supple. no JVD. Carotids 2+ bilat; no bruits. No lymphadenopathy or thryomegaly appreciated. Cor: PMI nondisplaced. Regular rate & rhythm. No rubs, gallops or murmurs. Lungs: clear Abdomen: soft, nontender, nondistended. No hepatosplenomegaly. No bruits or masses. Good bowel sounds. Extremities: no cyanosis, clubbing, rash, edema Neuro: alert & orientedx3, cranial nerves grossly intact. moves all 4 extremities w/o difficulty. Affect pleasant   ASSESSMENT & PLAN:   1. 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 Required inotrope support with DBA. - Echo (9/23): EF 30-35% RV moderately reduced trivial MR - Echo 03/16/22: EF 30-35% RV mod HK - Overall improved NYHA II-early III. Volume looks good  -Continue torsemide  40 daily + KCl 20 daily.  - Continue propranolol  10 mg bid. (Hx tremor)  - No spiro/Entresto with hyperK - Off Jardainc 10 due to weight loss. Will try Farxiga  5  - Has not been candidate for ICD with comorbidities  2. CAD with  late-presenting inferolateral MI with RV involvement - s/p emergent PCI/DES to RCA 09/17/21 - residual 75% distal LM/ostial LAD lesion - No s/s angina - Continue Eliquis  but drop to 2.5 bid with age > 80 and Weight < 60kg - He remains focused on having LM PCI. We had a long talk about this today. Previously LM PCI was prohibitive based on weight loss and fraility. He is now a bit more stable. That said, PCI would be very high risk and currently I do not think he is active enough to cross his anginal threshold.  I suggested he begin to increase his activity slowly and if he gets to the point where is starting to have angina or appears limited by coronary insufficiency then we can reconsider LM->LAD PCI.   3. Failure to thrive/anorexia/dysphagia - Much improved  4. DM2, poorly controlled - a1c 9.5% in 8/23 -> downt to 8.9  - On metformin and insulin  - Start Farxiga  5 as above  5. PAF/AFL - 3 day zio monitor (9/23). AFL burden 29%. - Regular today. - Continue amio 100 mg daily. - Decrease Eliquis  to 2.5 bid - Repeat Zio today to reassess burden  6. Hyperkalemia  - K has been running 4.5-4.8 lately (improved) - Labs today   7. OSA - Unable to wear CPAP. May be contributing to fatigue. - Encouraged him to try to wear CPAP  8. CKD 3a - Start Farxiga  5 as above   I spent a total of 45 minutes today: 1) reviewing the patient's medical records including previous charts, labs and recent notes from other providers; 2) examining the patient and counseling them on their medical issues/explaining the plan of care; 3) adjusting meds as needed and 4) ordering lab work or other needed tests.     Toribio Fuel MD 11:13 AM

## 2023-02-23 LAB — T3, FREE: T3, Free: 1.8 pg/mL — ABNORMAL LOW (ref 2.0–4.4)

## 2023-03-07 DIAGNOSIS — I4892 Unspecified atrial flutter: Secondary | ICD-10-CM | POA: Diagnosis not present

## 2023-03-15 ENCOUNTER — Ambulatory Visit (HOSPITAL_COMMUNITY)
Admission: RE | Admit: 2023-03-15 | Discharge: 2023-03-15 | Disposition: A | Discharge status: Home / Self Care | Payer: HMO | Source: Ambulatory Visit | Attending: Family Medicine | Admitting: Family Medicine

## 2023-03-15 DIAGNOSIS — E119 Type 2 diabetes mellitus without complications: Secondary | ICD-10-CM | POA: Insufficient documentation

## 2023-03-15 DIAGNOSIS — I081 Rheumatic disorders of both mitral and tricuspid valves: Secondary | ICD-10-CM | POA: Diagnosis not present

## 2023-03-15 DIAGNOSIS — I110 Hypertensive heart disease with heart failure: Secondary | ICD-10-CM | POA: Diagnosis not present

## 2023-03-15 DIAGNOSIS — I5022 Chronic systolic (congestive) heart failure: Secondary | ICD-10-CM | POA: Diagnosis not present

## 2023-03-15 LAB — ECHOCARDIOGRAM COMPLETE
AR max vel: 1.75 cm2
AV Area VTI: 1.78 cm2
AV Area mean vel: 1.69 cm2
AV Mean grad: 3 mmHg
AV Peak grad: 4.9 mmHg
Ao pk vel: 1.11 m/s
Area-P 1/2: 4.21 cm2
Calc EF: 33 %
MV VTI: 1.28 cm2
S' Lateral: 4.4 cm
Single Plane A2C EF: 32.8 %
Single Plane A4C EF: 31.1 %

## 2023-03-22 DIAGNOSIS — Z6820 Body mass index (BMI) 20.0-20.9, adult: Secondary | ICD-10-CM | POA: Diagnosis not present

## 2023-03-22 DIAGNOSIS — I5030 Unspecified diastolic (congestive) heart failure: Secondary | ICD-10-CM | POA: Diagnosis not present

## 2023-03-22 DIAGNOSIS — E1165 Type 2 diabetes mellitus with hyperglycemia: Secondary | ICD-10-CM | POA: Diagnosis not present

## 2023-03-22 DIAGNOSIS — I10 Essential (primary) hypertension: Secondary | ICD-10-CM | POA: Diagnosis not present

## 2023-03-22 DIAGNOSIS — F331 Major depressive disorder, recurrent, moderate: Secondary | ICD-10-CM | POA: Diagnosis not present

## 2023-04-06 ENCOUNTER — Other Ambulatory Visit (HOSPITAL_COMMUNITY): Payer: Self-pay | Admitting: Internal Medicine

## 2023-05-20 ENCOUNTER — Telehealth (HOSPITAL_COMMUNITY): Payer: Self-pay

## 2023-05-20 NOTE — Progress Notes (Signed)
 ADVANCED HF CLINIC NOTE   PCP: Leesa Pulling, MD Sleep Cardiology: Dr Micael Adas  HF Cardiologist: Dr Julane Ny  HPI: 83 y.o. male with hx uncontrolled DM II, HTN, HLD. Admitted 09/17/21 with late presenting inferolateral STEMI with RV involvement c/b cardiogenic shock in setting of RV failure. Found to have proximal occlusion of RCA and 75% d LM/ostial LAD. Underwent emergent PCI/DES to RCA. Staged PCI LM/LAD to be planned at a later date. EF ~ 45%.    Hemodynamics c/w low-output RV failure. Initially on Milrinone  for inotrope support, later switched to DBA/NE d/t persistent low-output. Developed AKI and shock liver.  Course c/b paroxysmal AF. Loaded with IV amio, in and out of a fib. Rate controlled.  Amio loading to be continued after discharge.  Zio 10/05/21 io 29 % A flutter.   He was seen in the HF clinic 10/05/21 and was feeling bad due to fatigue. K 6.3, SCr 1.5. Dig , spiro, and losartan  stopped. Given Lokelma .    Follow up 11/23, NYHA III and volume ok. Main issue was FTT, GI working up. Called cardiology after hours 12/23 with c/o SOB and leg swelling. Advised increase Lasix  dose for a few days then use PRN.  Echo 03/16/22: EF 30-35% RV mod HK  Follow up 04/16/22, volume overload R>L with NYHA IIIb-IV. Instructed to use Furoscix  80 x 3 days, Farxiga  stopped with UTIs.   Here with his wife for f/u. Says he feels ok. Wife says he is very depressed. Complains that everything taste likes grease. Not very active due to cold weather. Trying to force himself to walk. Still SOB with mild activity. No edema, orthopnea or PND. Very rare CP. Weight nadired at 116. Now back up to 140 pounds. Says he is worried about his heart. Off Jardaince due to weight loss    ROS: All systems negative except as listed in HPI, PMH and Problem List.  SH:  Social History   Socioeconomic History   Marital status: Married    Spouse name: Not on file   Number of children: Not on file   Years of  education: Not on file   Highest education level: Not on file  Occupational History   Not on file  Tobacco Use   Smoking status: Never   Smokeless tobacco: Never  Vaping Use   Vaping status: Never Used  Substance and Sexual Activity   Alcohol  use: Never   Drug use: Never   Sexual activity: Not on file  Other Topics Concern   Not on file  Social History Narrative   Not on file   Social Drivers of Health   Financial Resource Strain: Not on file  Food Insecurity: No Food Insecurity (09/23/2021)   Hunger Vital Sign    Worried About Running Out of Food in the Last Year: Never true    Ran Out of Food in the Last Year: Never true  Transportation Needs: No Transportation Needs (09/23/2021)   PRAPARE - Administrator, Civil Service (Medical): No    Lack of Transportation (Non-Medical): No  Physical Activity: Not on file  Stress: Not on file  Social Connections: Not on file  Intimate Partner Violence: Not At Risk (09/23/2021)   Humiliation, Afraid, Rape, and Kick questionnaire    Fear of Current or Ex-Partner: No    Emotionally Abused: No    Physically Abused: No    Sexually Abused: No   FH: No family history on file.  Past Medical History:  Diagnosis Date   Cancer [facility])    prostate   Diabetes mellitus without complication (HCC)    Hypertension    Current Outpatient Medications  Medication Sig Dispense Refill   amiodarone  (PACERONE ) 200 MG tablet Take 100 mg by mouth daily.     apixaban  (ELIQUIS ) 2.5 MG TABS tablet Take 1 tablet (2.5 mg total) by mouth 2 (two) times daily. 60 tablet 11   dapagliflozin  propanediol (FARXIGA ) 5 MG TABS tablet Take 1 tablet (5 mg total) by mouth daily before breakfast. 30 tablet 11   dorzolamide  (TRUSOPT ) 2 % ophthalmic solution Place 1 drop into both eyes in the morning.     FLUoxetine (PROZAC) 10 MG capsule Take 10 mg by mouth daily.     insulin  glargine (LANTUS) 100 UNIT/ML injection Inject 8-20 Units into the skin at bedtime.      latanoprost  (XALATAN ) 0.005 % ophthalmic solution Place 1 drop into both eyes at bedtime.     metFORMIN (GLUCOPHAGE) 850 MG tablet Take 850 mg by mouth in the morning and at bedtime. (Morning & afternoon)     ondansetron  (ZOFRAN ) 8 MG tablet Take 8 mg by mouth as needed for nausea or vomiting.     pantoprazole  (PROTONIX ) 40 MG tablet Take 1 tablet (40 mg total) by mouth 2 (two) times daily. 90 tablet 3   potassium chloride  SA (KLOR-CON  M) 20 MEQ tablet TAKE ONE (1) TABLET BY MOUTH EVERY DAY 90 tablet 3   propranolol  (INDERAL ) 10 MG tablet Take 20 mg by mouth 2 (two) times daily.     torsemide  (DEMADEX ) 20 MG tablet TAKE TWO (2) TABLETS BY MOUTH DAILY 180 tablet 3   No current facility-administered medications for this visit.   There were no vitals taken for this visit.  Wt Readings from Last 3 Encounters:  02/22/23 65.6 kg (144 lb 9.6 oz)  09/07/22 59.6 kg (131 lb 6.4 oz)  04/23/22 55.3 kg (122 lb)   PHYSICAL EXAM: General:  Thin elderly No respiratory difiiculty  HEENT: normal Neck: supple. no JVD. Carotids 2+ bilat; no bruits. No lymphadenopathy or thryomegaly appreciated. Cor: PMI nondisplaced. Regular rate & rhythm. No rubs, gallops or murmurs. Lungs: clear Abdomen: soft, nontender, nondistended. No hepatosplenomegaly. No bruits or masses. Good bowel sounds. Extremities: no cyanosis, clubbing, rash, edema Neuro: alert & orientedx3, cranial nerves grossly intact. moves all 4 extremities w/o difficulty. Affect pleasant   ASSESSMENT & PLAN:   1. 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 Required inotrope support with DBA. - Echo (9/23): EF 30-35% RV moderately reduced trivial MR - Echo 03/16/22: EF 30-35% RV mod HK - Overall improved NYHA II-early III. Volume looks good  -Continue torsemide  40 daily + KCl 20 daily.  - Continue propranolol  10 mg bid. (Hx tremor)  - No spiro/Entresto with  hyperK - Off Jardainc 10 due to weight loss. Will try Farxiga  5  - Has not been candidate for ICD with comorbidities  2. 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 We had a long talk about this today. Previously LM PCI was prohibitive based on weight loss and fraility. He is now a bit more stable. That said, PCI would  be very high risk and currently I do not think he is active enough to cross his anginal threshold. I suggested he begin to increase his activity slowly and if he gets to the point where is starting to have angina or appears limited by coronary insufficiency then we can reconsider LM->LAD PCI.   3. Failure to thrive/anorexia/dysphagia - Much improved  4. DM2, poorly controlled - a1c 9.5% in 8/23 -> downt to 8.9  - On metformin and insulin  - Start Farxiga  5 as above  5. PAF/AFL - 3 day zio monitor (9/23). AFL burden 29%. - Regular today. - Continue amio 100 mg daily. - Decrease Eliquis  to 2.5 bid - Repeat Zio today to reassess burden  6. Hyperkalemia  - K has been running 4.5-4.8 lately (improved) - Labs today   7. OSA - Unable to wear CPAP. May be contributing to fatigue. - Encouraged him to try to wear CPAP  8. CKD 3a - Start Farxiga  5 as above   I spent a total of 45 minutes today: 1) reviewing the patient's medical records including previous charts, labs and recent notes from other providers; 2) examining the patient and counseling them on their medical issues/explaining the plan of care; 3) adjusting meds as needed and 4) ordering lab work or other needed tests.     Elmarie Hacking MD 4:18 PM

## 2023-05-20 NOTE — Telephone Encounter (Signed)
 Called and spoke to pt's wife Stevens Eland to confirm/remind patient of their appointment at the [facility] on 05/23/23.   Appointment:   [x] Confirmed  [] Left mess   [] No answer/No voice mail  [] VM Full/unable to leave message  [] Phone not in service  Patient reminded to bring all medications and/or complete list.  Confirmed patient has transportation. Gave directions, instructed to utilize valet parking.

## 2023-05-23 ENCOUNTER — Ambulatory Visit (HOSPITAL_COMMUNITY)
Admission: RE | Admit: 2023-05-23 | Discharge: 2023-05-23 | Disposition: A | Discharge status: Home / Self Care | Payer: HMO | Source: Ambulatory Visit | Attending: Family Medicine | Admitting: Family Medicine

## 2023-05-23 ENCOUNTER — Encounter (HOSPITAL_COMMUNITY): Payer: Self-pay

## 2023-05-23 VITALS — BP 136/80 | HR 69 | Wt 140.2 lb

## 2023-05-23 DIAGNOSIS — I130 Hypertensive heart and chronic kidney disease with heart failure and stage 1 through stage 4 chronic kidney disease, or unspecified chronic kidney disease: Secondary | ICD-10-CM | POA: Diagnosis not present

## 2023-05-23 DIAGNOSIS — R1310 Dysphagia, unspecified: Secondary | ICD-10-CM | POA: Insufficient documentation

## 2023-05-23 DIAGNOSIS — N1831 Chronic kidney disease, stage 3a: Secondary | ICD-10-CM | POA: Diagnosis not present

## 2023-05-23 DIAGNOSIS — I252 Old myocardial infarction: Secondary | ICD-10-CM | POA: Diagnosis not present

## 2023-05-23 DIAGNOSIS — R630 Anorexia: Secondary | ICD-10-CM | POA: Diagnosis not present

## 2023-05-23 DIAGNOSIS — R627 Adult failure to thrive: Secondary | ICD-10-CM | POA: Diagnosis not present

## 2023-05-23 DIAGNOSIS — I4510 Unspecified right bundle-branch block: Secondary | ICD-10-CM | POA: Insufficient documentation

## 2023-05-23 DIAGNOSIS — G4733 Obstructive sleep apnea (adult) (pediatric): Secondary | ICD-10-CM

## 2023-05-23 DIAGNOSIS — N183 Chronic kidney disease, stage 3 unspecified: Secondary | ICD-10-CM

## 2023-05-23 DIAGNOSIS — E1169 Type 2 diabetes mellitus with other specified complication: Secondary | ICD-10-CM

## 2023-05-23 DIAGNOSIS — Z7984 Long term (current) use of oral hypoglycemic drugs: Secondary | ICD-10-CM | POA: Diagnosis not present

## 2023-05-23 DIAGNOSIS — E875 Hyperkalemia: Secondary | ICD-10-CM

## 2023-05-23 DIAGNOSIS — Z955 Presence of coronary angioplasty implant and graft: Secondary | ICD-10-CM | POA: Diagnosis not present

## 2023-05-23 DIAGNOSIS — I5022 Chronic systolic (congestive) heart failure: Secondary | ICD-10-CM | POA: Diagnosis not present

## 2023-05-23 DIAGNOSIS — I2510 Atherosclerotic heart disease of native coronary artery without angina pectoris: Secondary | ICD-10-CM | POA: Diagnosis not present

## 2023-05-23 DIAGNOSIS — I480 Paroxysmal atrial fibrillation: Secondary | ICD-10-CM | POA: Diagnosis not present

## 2023-05-23 DIAGNOSIS — Z794 Long term (current) use of insulin: Secondary | ICD-10-CM | POA: Diagnosis not present

## 2023-05-23 DIAGNOSIS — I10 Essential (primary) hypertension: Secondary | ICD-10-CM | POA: Diagnosis not present

## 2023-05-23 DIAGNOSIS — Z79899 Other long term (current) drug therapy: Secondary | ICD-10-CM | POA: Insufficient documentation

## 2023-05-23 DIAGNOSIS — E1165 Type 2 diabetes mellitus with hyperglycemia: Secondary | ICD-10-CM | POA: Diagnosis not present

## 2023-05-23 DIAGNOSIS — Z7901 Long term (current) use of anticoagulants: Secondary | ICD-10-CM | POA: Diagnosis not present

## 2023-05-23 DIAGNOSIS — Z6820 Body mass index (BMI) 20.0-20.9, adult: Secondary | ICD-10-CM | POA: Diagnosis not present

## 2023-05-23 DIAGNOSIS — E785 Hyperlipidemia, unspecified: Secondary | ICD-10-CM | POA: Insufficient documentation

## 2023-05-23 LAB — BASIC METABOLIC PANEL WITH GFR
Anion gap: 11 (ref 5–15)
BUN: 30 mg/dL — ABNORMAL HIGH (ref 8–23)
CO2: 27 mmol/L (ref 22–32)
Calcium: 9.2 mg/dL (ref 8.9–10.3)
Chloride: 100 mmol/L (ref 98–111)
Creatinine, Ser: 1.9 mg/dL — ABNORMAL HIGH (ref 0.61–1.24)
GFR, Estimated: 35 mL/min — ABNORMAL LOW (ref >60)
Glucose, Bld: 194 mg/dL — ABNORMAL HIGH (ref 70–99)
Potassium: 4.5 mmol/L (ref 3.5–5.1)
Sodium: 138 mmol/L (ref 135–145)

## 2023-05-23 LAB — CBC
HCT: 37.3 % — ABNORMAL LOW (ref 39.0–52.0)
Hemoglobin: 11.8 g/dL — ABNORMAL LOW (ref 13.0–17.0)
MCH: 28.9 pg (ref 26.0–34.0)
MCHC: 31.6 g/dL (ref 30.0–36.0)
MCV: 91.4 fL (ref 80.0–100.0)
Platelets: 287 10*3/uL (ref 150–400)
RBC: 4.08 MIL/uL — ABNORMAL LOW (ref 4.22–5.81)
RDW: 15.5 % (ref 11.5–15.5)
WBC: 6.7 10*3/uL (ref 4.0–10.5)
nRBC: 0 % (ref 0.0–0.2)

## 2023-05-23 MED ORDER — AMIODARONE HCL 200 MG PO TABS: 200.0000 mg | ORAL_TABLET | Freq: Every day | ORAL | 8 refills | Status: DC

## 2023-05-23 NOTE — Addendum Note (Signed)
 Encounter addended by: Nida Barrow, RN on: 05/23/2023 4:05 PM  Actions taken: Charge Capture section accepted

## 2023-05-23 NOTE — Patient Instructions (Addendum)
 Thank you for coming in today  If you had labs drawn today, any labs that are abnormal the clinic will call you No news is good news  [facility] for [facility] and iron deficiency  Kardia Mobile is for your Heart Failure Detection   Medications: Increase amiodarone  to 200 mg 1 tablet daily   Follow up appointments:  Your physician recommends that you schedule a follow-up appointment in:  1 month With Dr. Julane Ny    Do the following things EVERYDAY: Weigh yourself in the morning before breakfast. Write it down and keep it in a log. Take your medicines as prescribed Eat low salt foods--Limit salt (sodium) to 2000 mg per day.  Stay as active as you can everyday Limit all fluids for the day to less than 2 liters   At the [facility], you and your health needs are our priority. As part of our continuing mission to provide you with exceptional heart care, we have created designated Provider Care Teams. These Care Teams include your primary Cardiologist (physician) and Advanced Practice Providers (APPs- Physician Assistants and Nurse Practitioners) who all work together to provide you with the care you need, when you need it.   You may see any of the following providers on your designated Care Team at your next follow up: Dr Jules Oar Dr Peder Bourdon Dr. Mimi Alt, NP Ruddy Corral, [place] [facility] [place], [place] Dennise Fitz, NP Luster Salters, PharmD   Please be sure to bring in all your medications bottles to every appointment.    Thank you for choosing [facility]  If you have any questions or concerns before your next appointment please send us  a message through [place] or call our office at [phone].    TO LEAVE A MESSAGE FOR THE NURSE SELECT OPTION 2, PLEASE LEAVE A MESSAGE INCLUDING: YOUR NAME DATE OF BIRTH CALL BACK NUMBER REASON FOR CALL**this is important as we  prioritize the call backs  YOU WILL RECEIVE A CALL BACK THE SAME DAY AS LONG AS YOU CALL BEFORE 4:00 PM

## 2023-05-23 NOTE — Progress Notes (Signed)
 ReDS Vest / Clip - 05/23/23 1200       ReDS Vest / Clip   Station Marker C    Ruler Value 26    ReDS Value Range Low volume    ReDS Actual Value 27

## 2023-06-27 ENCOUNTER — Encounter (HOSPITAL_COMMUNITY): Admitting: Internal Medicine

## 2023-07-06 ENCOUNTER — Telehealth (HOSPITAL_COMMUNITY): Payer: Self-pay

## 2023-07-06 NOTE — Telephone Encounter (Signed)
 Called to confirm/remind patient of their appointment at the [facility] on 07/07/23.   Appointment:   [x] Confirmed  [] Left mess   [] No answer/No voice mail  [] VM Full/unable to leave message  [] Phone not in service  Patient reminded to bring all medications and/or complete list.  Confirmed patient has transportation. Gave directions, instructed to utilize valet parking.

## 2023-07-07 ENCOUNTER — Ambulatory Visit (HOSPITAL_COMMUNITY)
Admission: RE | Admit: 2023-07-07 | Discharge: 2023-07-07 | Disposition: A | Discharge status: Home / Self Care | Source: Ambulatory Visit | Attending: Adult Health | Admitting: Adult Health

## 2023-07-07 VITALS — BP 140/64 | HR 49 | Wt 150.6 lb

## 2023-07-07 DIAGNOSIS — I480 Paroxysmal atrial fibrillation: Secondary | ICD-10-CM | POA: Insufficient documentation

## 2023-07-07 DIAGNOSIS — E785 Hyperlipidemia, unspecified: Secondary | ICD-10-CM | POA: Insufficient documentation

## 2023-07-07 DIAGNOSIS — I4892 Unspecified atrial flutter: Secondary | ICD-10-CM | POA: Diagnosis not present

## 2023-07-07 DIAGNOSIS — Z7984 Long term (current) use of oral hypoglycemic drugs: Secondary | ICD-10-CM | POA: Diagnosis not present

## 2023-07-07 DIAGNOSIS — N183 Chronic kidney disease, stage 3 unspecified: Secondary | ICD-10-CM

## 2023-07-07 DIAGNOSIS — I5022 Chronic systolic (congestive) heart failure: Secondary | ICD-10-CM

## 2023-07-07 DIAGNOSIS — N1831 Chronic kidney disease, stage 3a: Secondary | ICD-10-CM | POA: Diagnosis not present

## 2023-07-07 DIAGNOSIS — I2510 Atherosclerotic heart disease of native coronary artery without angina pectoris: Secondary | ICD-10-CM | POA: Insufficient documentation

## 2023-07-07 DIAGNOSIS — R1310 Dysphagia, unspecified: Secondary | ICD-10-CM | POA: Insufficient documentation

## 2023-07-07 DIAGNOSIS — Z7901 Long term (current) use of anticoagulants: Secondary | ICD-10-CM | POA: Diagnosis not present

## 2023-07-07 DIAGNOSIS — I255 Ischemic cardiomyopathy: Secondary | ICD-10-CM | POA: Diagnosis not present

## 2023-07-07 DIAGNOSIS — R630 Anorexia: Secondary | ICD-10-CM | POA: Insufficient documentation

## 2023-07-07 DIAGNOSIS — I130 Hypertensive heart and chronic kidney disease with heart failure and stage 1 through stage 4 chronic kidney disease, or unspecified chronic kidney disease: Secondary | ICD-10-CM | POA: Insufficient documentation

## 2023-07-07 DIAGNOSIS — E1122 Type 2 diabetes mellitus with diabetic chronic kidney disease: Secondary | ICD-10-CM | POA: Insufficient documentation

## 2023-07-07 DIAGNOSIS — Z79899 Other long term (current) drug therapy: Secondary | ICD-10-CM | POA: Insufficient documentation

## 2023-07-07 DIAGNOSIS — I252 Old myocardial infarction: Secondary | ICD-10-CM | POA: Insufficient documentation

## 2023-07-07 DIAGNOSIS — G4733 Obstructive sleep apnea (adult) (pediatric): Secondary | ICD-10-CM | POA: Diagnosis not present

## 2023-07-07 DIAGNOSIS — R627 Adult failure to thrive: Secondary | ICD-10-CM | POA: Insufficient documentation

## 2023-07-07 DIAGNOSIS — Z794 Long term (current) use of insulin: Secondary | ICD-10-CM | POA: Diagnosis not present

## 2023-07-07 NOTE — Progress Notes (Signed)
 Advanced Heart Failure Clinic     PCP: Leesa Pulling, MD and the [facility] Sleep Cardiology: Dr Micael Adas  HF Cardiologist: Dr Julane Ny  HPI: 83 y.o. male with hx uncontrolled DM II, HTN, HLD. Admitted 09/17/21 with late presenting inferolateral STEMI with RV involvement c/b cardiogenic shock in setting of RV failure. Found to have proximal occlusion of RCA and 75% d LM/ostial LAD. Underwent emergent PCI/DES to RCA. Staged PCI LM/LAD to be planned at a later date. EF ~ 45%.    Hemodynamics c/w low-output RV failure. Initially on Milrinone  for inotrope support, later switched to DBA/NE d/t persistent low-output. Developed AKI and shock liver.  Course c/b paroxysmal AF. Loaded with IV amio, in and out of a fib. Rate controlled.  Amio loading to be continued after discharge.  Zio 10/05/21: 29% A flutter.   Follow up 10/05/21 and was feeling bad due to fatigue. K 6.3, SCr 1.5=>Dig, spiro, and losartan  stopped. Given Lokelma .    Follow up 11/23, NYHA III and volume ok. Main issue was FTT, GI working up. Called cardiology after hours 12/23 with c/o SOB and leg swelling. Advised increase Lasix  dose for a few days then use PRN.  Echo 03/16/22: EF 30-35% RV mod HK  Follow up 04/16/22, volume overload R>L with NYHA IIIb-IV. Instructed to use Furoscix  80 x 3 days, Farxiga  stopped with UTIs.   Zio 2/25 showed most NSR, rare PVCs, 1 run SVT. Echo 2/25 showed EF 29%, G2DD, RV ok, mild MR.  Recently seen at [facility]. Torsemide  held x 2 days due to worsening renal function.   Today he returns for HF follow up. Followed closely at [facility] for PCP. Complaining of fatigue. Remains SOB with exertion. Gets a little SOB after walking 180 feet. Says he has been active at home.  Denies PND/Orthopnea. Appetite ok. Swallowing much better.  No fever or chills. Weight at home 139-146  pounds. Taking all medications.   ROS: All systems negative except as listed in HPI, PMH and Problem List.  SH:  Social  History   Socioeconomic History   Marital status: Married    Spouse name: Not on file   Number of children: Not on file   Years of education: Not on file   Highest education level: Not on file  Occupational History   Not on file  Tobacco Use   Smoking status: Never   Smokeless tobacco: Never  Vaping Use   Vaping status: Never Used  Substance and Sexual Activity   Alcohol  use: Never   Drug use: Never   Sexual activity: Not on file  Other Topics Concern   Not on file  Social History Narrative   Not on file   Social Drivers of Health   Financial Resource Strain: Not on file  Food Insecurity: No Food Insecurity (09/23/2021)   Hunger Vital Sign    Worried About Running Out of Food in the Last Year: Never true    Ran Out of Food in the Last Year: Never true  Transportation Needs: No Transportation Needs (09/23/2021)   PRAPARE - Administrator, Civil Service (Medical): No    Lack of Transportation (Non-Medical): No  Physical Activity: Not on file  Stress: Not on file  Social Connections: Not on file  Intimate Partner Violence: Not At Risk (09/23/2021)   Humiliation, Afraid, Rape, and Kick questionnaire    Fear of Current or  Ex-Partner: No    Emotionally Abused: No    Physically Abused: No    Sexually Abused: No   FH: No family history on file.  Past Medical History:  Diagnosis Date   Cancer [facility])    prostate   Diabetes mellitus without complication (HCC)    Hypertension    Current Outpatient Medications  Medication Sig Dispense Refill   amiodarone  (PACERONE ) 200 MG tablet Take 1 tablet (200 mg total) by mouth daily. 30 tablet 8   apixaban  (ELIQUIS ) 2.5 MG TABS tablet Take 1 tablet (2.5 mg total) by mouth 2 (two) times daily. 60 tablet 11   dapagliflozin  propanediol (FARXIGA ) 10 MG TABS tablet Take 10 mg by mouth daily.     dorzolamide  (TRUSOPT ) 2 % ophthalmic solution Place 1 drop into both eyes in the morning.     FLUoxetine (PROZAC) 10 MG capsule Take 10 mg  by mouth daily.     insulin  glargine (LANTUS) 100 UNIT/ML injection Inject 8-20 Units into the skin at bedtime.     latanoprost  (XALATAN ) 0.005 % ophthalmic solution Place 1 drop into both eyes at bedtime.     Olodaterol HCl (STRIVERDI RESPIMAT) 2.5 MCG/ACT AERS Inhale 2 puffs into the lungs 2 (two) times daily.     ondansetron  (ZOFRAN ) 8 MG tablet Take 8 mg by mouth as needed for nausea or vomiting.     pantoprazole  (PROTONIX ) 40 MG tablet Take 1 tablet (40 mg total) by mouth 2 (two) times daily. 90 tablet 3   potassium chloride  SA (KLOR-CON  M) 20 MEQ tablet TAKE ONE (1) TABLET BY MOUTH EVERY DAY 90 tablet 3   propranolol  (INDERAL ) 10 MG tablet Take 20 mg by mouth 2 (two) times daily.     sitaGLIPtin (JANUVIA) 50 MG tablet Take 25 mg by mouth daily.     torsemide  (DEMADEX ) 20 MG tablet TAKE TWO (2) TABLETS BY MOUTH DAILY 180 tablet 3   No current facility-administered medications for this encounter.   BP (!) 140/64   Pulse (!) 49   Wt 68.3 kg (150 lb 9.6 oz)   SpO2 100%   BMI 22.24 kg/m   Wt Readings from Last 3 Encounters:  07/07/23 68.3 kg (150 lb 9.6 oz)  05/23/23 63.6 kg (140 lb 3.2 oz)  02/22/23 65.6 kg (144 lb 9.6 oz)   PHYSICAL EXAM: General:   No resp difficulty. Walked in the clinic.  Neck: supple. no JVD.  Cor: PMI nondisplaced. Regular rate & rhythm. No rubs, gallops or murmurs. Lungs: clear Abdomen: soft, nontender, nondistended.  Extremities: no cyanosis, clubbing, rash, edema Neuro: alert & oriented x3  ASSESSMENT & PLAN:   1. 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 Required inotrope support with DBA. - Echo (9/23): EF 30-35% RV moderately reduced trivial MR - Echo 03/16/22: EF 30-35% RV mod HK - Echo 2/25: EF 29%, normal RV, mild MR -NYHA III . Functional status a little better.  - Appears euvolemic . Continue torsemide  40 daily + KCL 20 daily.  - Continue propranolol   20 mg bid. (Hx tremor)  - Continue Farxiga  10 mg daily (Off Jardiance  10 with weight loss). No GU symptoms - No spiro/Entresto with  CKD  - Has not been candidate for ICD with co morbidities  2. PAF/AFL - 3 day zio monitor (9/23). AFL burden 29%. - 7 day Zio 2/25: mostly NSR, no AF _Regular on exam.   - Continue  amiodarone   200 mg daily. - Continue Eliquis  2.5 mg bid  3. CAD  - s/p emergent PCI/DES to RCA 09/17/21 - residual 75% distal LM/ostial LAD lesion -Denies chest pain.  - Continue Eliquis  2.5 bid with age > 80 and weight < 60kg - Dr. Julane Ny previously had long talk with him about LM PCI. Previously LM PCI was prohibitive based on weight loss and fraility.   4. DM2, poorly controlled -  On insulin  + Farxiga  - No longer on metformin and blood sugars > 300 -Per PCP   5. Failure to thrive/anorexia/dysphagia Body mass index is 22.24 kg/m. Appetite getting better.   6. OSA - Unable to wear CPAP. May be contributing to fatigue.  7. CKD 3a - Baseline SCr 1.8 - Continue SGLT2i - He has been referred to Nephrology by PCP    Asked him to get labs faxed back to HF clinic. Fax number provided.     Follow up in 3 months with Dr Selinda Dales NP-C  11:06 AM

## 2023-07-07 NOTE — Patient Instructions (Signed)
 Great to see you today!!!  No changes, please continue current medications  [facility] work to us  at [phone]  Your physician recommends that you schedule a follow-up appointment in: 3 months  Do the following things EVERYDAY: Weigh yourself in the morning before breakfast. Write it down and keep it in a log. Take your medicines as prescribed Eat low salt foods--Limit salt (sodium) to 2000 mg per day.  Stay as active as you can everyday Limit all fluids for the day to less than 2 liters  If you have any questions or concerns before your next appointment please send us  a message through [place] or call our office at [phone].    TO LEAVE A MESSAGE FOR THE NURSE SELECT OPTION 2, PLEASE LEAVE A MESSAGE INCLUDING: YOUR NAME DATE OF BIRTH CALL BACK NUMBER REASON FOR CALL**this is important as we prioritize the call backs  YOU WILL RECEIVE A CALL BACK THE SAME DAY AS LONG AS YOU CALL BEFORE 4:00 PM  At the [facility], you and your health needs are our priority. As part of our continuing mission to provide you with exceptional heart care, we have created designated Provider Care Teams. These Care Teams include your primary Cardiologist (physician) and Advanced Practice Providers (APPs- Physician Assistants and Nurse Practitioners) who all work together to provide you with the care you need, when you need it.   You may see any of the following providers on your designated Care Team at your next follow up: Dr Jules Oar Dr Peder Bourdon Dr. Alwin Baars Dr. Arta Lark Amy Marijane Shoulders, NP Ruddy Corral, [place] [facility] [place], [place] Dennise Fitz, NP [place] Lee, NP [place] Dellen, NP Luster Salters, PharmD Bevely Brush, PharmD   Please be sure to bring in all your medications bottles to every appointment.    Thank you for choosing [facility]

## 2023-07-25 ENCOUNTER — Other Ambulatory Visit (HOSPITAL_COMMUNITY): Payer: Self-pay | Admitting: Internal Medicine

## 2023-07-26 ENCOUNTER — Other Ambulatory Visit (HOSPITAL_COMMUNITY): Payer: Self-pay | Admitting: Nephrology

## 2023-07-26 DIAGNOSIS — R8290 Unspecified abnormal findings in urine: Secondary | ICD-10-CM

## 2023-07-26 DIAGNOSIS — N1832 Chronic kidney disease, stage 3b: Secondary | ICD-10-CM

## 2023-07-26 DIAGNOSIS — I129 Hypertensive chronic kidney disease with stage 1 through stage 4 chronic kidney disease, or unspecified chronic kidney disease: Secondary | ICD-10-CM

## 2023-08-03 ENCOUNTER — Ambulatory Visit (HOSPITAL_COMMUNITY)
Admission: RE | Admit: 2023-08-03 | Discharge: 2023-08-03 | Disposition: A | Discharge status: Home / Self Care | Source: Ambulatory Visit | Attending: Nephrology | Admitting: Nephrology

## 2023-08-03 DIAGNOSIS — I129 Hypertensive chronic kidney disease with stage 1 through stage 4 chronic kidney disease, or unspecified chronic kidney disease: Secondary | ICD-10-CM | POA: Insufficient documentation

## 2023-08-03 DIAGNOSIS — N1832 Chronic kidney disease, stage 3b: Secondary | ICD-10-CM | POA: Diagnosis not present

## 2023-08-03 DIAGNOSIS — R8290 Unspecified abnormal findings in urine: Secondary | ICD-10-CM | POA: Diagnosis not present

## 2023-08-03 DIAGNOSIS — N281 Cyst of kidney, acquired: Secondary | ICD-10-CM | POA: Diagnosis not present

## 2023-08-22 DIAGNOSIS — H401131 Primary open-angle glaucoma, bilateral, mild stage: Secondary | ICD-10-CM | POA: Diagnosis not present

## 2023-08-22 DIAGNOSIS — H04123 Dry eye syndrome of bilateral lacrimal glands: Secondary | ICD-10-CM | POA: Diagnosis not present

## 2023-10-07 ENCOUNTER — Encounter (HOSPITAL_COMMUNITY): Payer: Self-pay | Admitting: Internal Medicine

## 2023-10-07 ENCOUNTER — Ambulatory Visit (HOSPITAL_COMMUNITY)
Admission: RE | Admit: 2023-10-07 | Discharge: 2023-10-07 | Disposition: A | Discharge status: Home / Self Care | Source: Ambulatory Visit | Attending: Internal Medicine | Admitting: Internal Medicine

## 2023-10-07 VITALS — BP 130/62 | HR 50 | Wt 150.0 lb

## 2023-10-07 DIAGNOSIS — Z79899 Other long term (current) drug therapy: Secondary | ICD-10-CM | POA: Diagnosis not present

## 2023-10-07 DIAGNOSIS — G4733 Obstructive sleep apnea (adult) (pediatric): Secondary | ICD-10-CM

## 2023-10-07 DIAGNOSIS — I480 Paroxysmal atrial fibrillation: Secondary | ICD-10-CM | POA: Diagnosis not present

## 2023-10-07 DIAGNOSIS — I5022 Chronic systolic (congestive) heart failure: Secondary | ICD-10-CM | POA: Diagnosis not present

## 2023-10-07 DIAGNOSIS — I2510 Atherosclerotic heart disease of native coronary artery without angina pectoris: Secondary | ICD-10-CM

## 2023-10-07 LAB — COMPREHENSIVE METABOLIC PANEL WITH GFR
ALT: 26 U/L (ref 0–44)
AST: 30 U/L (ref 15–41)
Albumin: 3.4 g/dL — ABNORMAL LOW (ref 3.5–5.0)
Alkaline Phosphatase: 93 U/L (ref 38–126)
Anion gap: 9 (ref 5–15)
BUN: 35 mg/dL — ABNORMAL HIGH (ref 8–23)
CO2: 27 mmol/L (ref 22–32)
Calcium: 9.1 mg/dL (ref 8.9–10.3)
Chloride: 103 mmol/L (ref 98–111)
Creatinine, Ser: 2.25 mg/dL — ABNORMAL HIGH (ref 0.61–1.24)
GFR, Estimated: 28 mL/min — ABNORMAL LOW (ref >60)
Glucose, Bld: 173 mg/dL — ABNORMAL HIGH (ref 70–99)
Potassium: 4.7 mmol/L (ref 3.5–5.1)
Sodium: 139 mmol/L (ref 135–145)
Total Bilirubin: 0.6 mg/dL (ref 0.0–1.2)
Total Protein: 7 g/dL (ref 6.5–8.1)

## 2023-10-07 LAB — TSH: TSH: 1.327 u[IU]/mL (ref 0.350–4.500)

## 2023-10-07 LAB — T4, FREE: Free T4: 1.12 ng/dL (ref 0.61–1.12)

## 2023-10-07 LAB — BRAIN NATRIURETIC PEPTIDE: B Natriuretic Peptide: 703.8 pg/mL — ABNORMAL HIGH (ref 0.0–100.0)

## 2023-10-07 MED ORDER — AMIODARONE HCL 200 MG PO TABS: 200.0000 mg | ORAL_TABLET | Freq: Every day | ORAL | 1 refills | Status: AC

## 2023-10-07 NOTE — Patient Instructions (Signed)
 Medication Changes:  AMIODARONE  SENT TO [facility] IN [facility]   Lab Work:  Labs done today, your results will be available in MyChart, we will contact you for abnormal readings.  Follow-Up in: 6 MONTHS PLEASE CALL OUR OFFICE AROUND JANUARY TO GET SCHEDULED FOR YOUR MARCHAPPOINTMENT. PHONE NUMBER IS [phone] OPTION 2   At the [facility], you and your health needs are our priority. We have a designated team specialized in the treatment of Heart Failure. This Care Team includes your primary Heart Failure Specialized Cardiologist (physician), Advanced Practice Providers (APPs- Physician Assistants and Nurse Practitioners), and Pharmacist who all work together to provide you with the care you need, when you need it.   You may see any of the following providers on your designated Care Team at your next follow up:  Dr. Toribio Fuel Dr. Ezra Shuck Dr. Ria Commander Dr. Odis Brownie Greig Mosses, NP Caffie Shed, [place] [facility] [place], [place] Beckey Coe, NP [place] Lee, NP Tinnie Redman, PharmD   Please be sure to bring in all your medications bottles to every appointment.   Need to Contact Us :  If you have any questions or concerns before your next appointment please send us  a message through [place] or call our office at [phone].    TO LEAVE A MESSAGE FOR THE NURSE SELECT OPTION 2, PLEASE LEAVE A MESSAGE INCLUDING: YOUR NAME DATE OF BIRTH CALL BACK NUMBER REASON FOR CALL**this is important as we prioritize the call backs  YOU WILL RECEIVE A CALL BACK THE SAME DAY AS LONG AS YOU CALL BEFORE 4:00 PM

## 2023-10-07 NOTE — Progress Notes (Signed)
 [facility]     PCP: Toribio Jerel MATSU, MD and the [facility] Cardiology: Dr Shlomo  HF Cardiologist: Dr Cherrie  HPI: 83 y.o. male with hx uncontrolled DM II, HTN, HLD. Admitted 09/17/21 with late presenting inferolateral STEMI with RV involvement c/b cardiogenic shock in setting of RV failure. Found to have proximal occlusion of RCA and 75% d LM/ostial LAD. Underwent emergent PCI/DES to RCA. Staged PCI LM/LAD to be planned at a later date. EF ~ 45%.    Hemodynamics c/w low-output RV failure. Initially on Milrinone  for inotrope support, later switched to DBA/NE d/t persistent low-output. Developed AKI and shock liver.  Course c/b paroxysmal AF. Loaded with IV amio, in and out of a fib. Rate controlled.  Amio loading to be continued after discharge.  Zio 10/05/21: 29% A flutter.   Follow up 10/05/21 and was feeling bad due to fatigue. K 6.3, SCr 1.5=>Dig, spiro, and losartan  stopped. Given Lokelma .    Follow up 11/23, NYHA III and volume ok. Main issue was FTT, GI working up. Called cardiology after hours 12/23 with c/o SOB and leg swelling. Advised increase Lasix  dose for a few days then use PRN.  Echo 03/16/22: EF 30-35% RV mod HK  Follow up 04/16/22, volume overload R>L with NYHA IIIb-IV. Instructed to use Furoscix  80 x 3 days, Farxiga  stopped with UTIs.   Zio 2/25 showed most NSR, rare PVCs, 1 run SVT. Echo 2/25 showed EF 29%, G2DD, RV ok, mild MR.  Today he returns for HF follow up. Followed closely at [facility] for PCP. Continues to complain of fatigue and exertional SOB. Says as long as he doesn't do anything he feels ok. Rare CP.   ROS: All systems negative except as listed in HPI, PMH and Problem List.  SH:  Social History   Socioeconomic History   Marital status: Married    Spouse name: Not on file   Number of children: Not on file   Years of education: Not on file   Highest education level: Not on file  Occupational History   Not on  file  Tobacco Use   Smoking status: Never   Smokeless tobacco: Never  Vaping Use   Vaping status: Never Used  Substance and Sexual Activity   Alcohol  use: Never   Drug use: Never   Sexual activity: Not on file  Other Topics Concern   Not on file  Social History Narrative   Not on file   Social Drivers of Health   Financial Resource Strain: Not on file  Food Insecurity: No Food Insecurity (09/23/2021)   Hunger Vital Sign    Worried About Running Out of Food in the Last Year: Never true    Ran Out of Food in the Last Year: Never true  Transportation Needs: No Transportation Needs (09/23/2021)   PRAPARE - Administrator, Civil Service (Medical): No    Lack of Transportation (Non-Medical): No  Physical Activity: Not on file  Stress: Not on file  Social Connections: Not on file  Intimate Partner Violence: Not At Risk (09/23/2021)   Humiliation, Afraid, Rape, and Kick questionnaire    Fear of Current or Ex-Partner: No    Emotionally Abused: No    Physically Abused: No    Sexually Abused: No   FH: History reviewed. No pertinent family history.  Past Medical History:  Diagnosis Date   Cancer (  HCC)    prostate   Diabetes mellitus without complication (HCC)    Hypertension    Current Outpatient Medications  Medication Sig Dispense Refill   amiodarone  (PACERONE ) 200 MG tablet Take 1 tablet (200 mg total) by mouth daily. 30 tablet 8   apixaban  (ELIQUIS ) 2.5 MG TABS tablet Take 1 tablet (2.5 mg total) by mouth 2 (two) times daily. 60 tablet 11   dapagliflozin  propanediol (FARXIGA ) 10 MG TABS tablet Take 10 mg by mouth daily.     dorzolamide  (TRUSOPT ) 2 % ophthalmic solution Place 1 drop into both eyes in the morning.     ferrous sulfate 325 (65 FE) MG EC tablet Take 325 mg by mouth daily with breakfast.     FLUoxetine (PROZAC) 10 MG capsule Take 10 mg by mouth daily.     insulin  glargine (LANTUS) 100 UNIT/ML injection Inject 12 Units into the skin in the morning. 8 units  in the pm     latanoprost  (XALATAN ) 0.005 % ophthalmic solution Place 1 drop into both eyes at bedtime.     ondansetron  (ZOFRAN ) 8 MG tablet Take 8 mg by mouth as needed for nausea or vomiting.     pantoprazole  (PROTONIX ) 40 MG tablet Take 1 tablet (40 mg total) by mouth 2 (two) times daily. 90 tablet 3   potassium chloride  SA (KLOR-CON  M) 20 MEQ tablet TAKE ONE (1) TABLET BY MOUTH EVERY DAY 90 tablet 3   propranolol  (INDERAL ) 10 MG tablet Take 20 mg by mouth 2 (two) times daily.     Semaglutide,0.25 or 0.5MG /DOS, (OZEMPIC, 0.25 OR 0.5 MG/DOSE,) 2 MG/1.5ML SOPN Inject 0.25 mg into the skin once a week.     torsemide  (DEMADEX ) 20 MG tablet TAKE TWO (2) TABLETS BY MOUTH DAILY 180 tablet 3   Olodaterol HCl (STRIVERDI RESPIMAT) 2.5 MCG/ACT AERS Inhale 2 puffs into the lungs 2 (two) times daily. (Patient not taking: Reported on 10/07/2023)     No current facility-administered medications for this encounter.   BP 130/62   Pulse (!) 50   Wt 68 kg (150 lb)   SpO2 99%   BMI 22.15 kg/m   Wt Readings from Last 3 Encounters:  10/07/23 68 kg (150 lb)  07/07/23 68.3 kg (150 lb 9.6 oz)  05/23/23 63.6 kg (140 lb 3.2 oz)   PHYSICAL EXAM: General: Elderly. No resp difficulty HEENT: normal Neck: supple. no JVD. Carotids 2+ bilat; no bruits. No lymphadenopathy or thryomegaly appreciated. Cor: PMI nondisplaced. Regular rate & rhythm. No rubs, gallops or murmurs. Lungs: clear Abdomen: soft, nontender, nondistended. No hepatosplenomegaly. No bruits or masses. Good bowel sounds. Extremities: no cyanosis, clubbing, rash, edema Neuro: alert & orientedx3, cranial nerves grossly intact. moves all 4 extremities w/o difficulty. Affect pleasant   ASSESSMENT & PLAN:   1. 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 Required inotrope support with DBA. - Echo (9/23): EF 30-35% RV moderately reduced trivial MR - Echo 03/16/22:  EF 30-35% RV mod HK - Echo 2/25: EF 29%, normal RV, mild MR - Difficult situation. He reports feeling terrible but wife says he actually has been improving - Based on reported symptoms I again reviewed cath films with them and we discussed high-risk PCI of LM and both felt that they would not want to pursue at this time - Improved NYHA III  - Volume ok. . Continue torsemide  40 daily + KCL 20 daily.  - Continue propranolol   20 mg bid. (Hx tremor)  - Continue Farxiga  10 mg daily - No spiro/Entresto with  CKD. Has seen Renal and want to start losartan  - Has not been candidate for ICD with co morbidities - Get Fitbit an see if he can get 5K steps/day  2. PAF/AFL - 3 day zio monitor (9/23). AFL burden 29%. - 7 day Zio 2/25: mostly NSR, no AF - Remains in NSR - Continue amiodarone   200 mg daily. - Continue Eliquis  2.5 mg bid. No bleeding - amio labs today  3. CAD  - s/p emergent PCI/DES to RCA 09/17/21 - residual 75% distal LM/ostial LAD lesion - Denies CP   - Continue Eliquis  2.5 bid with age > 80 and weight < 60kg - As above, cath films reviewed again today. Not interested in pursuing high-risk LM PCI   4. DM2, poorly controlled -  On insulin  + Farxiga  -  Recently started on GLP  5. 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     Toribio Fuel MD 11:40 AM

## 2023-10-09 LAB — T3, FREE: T3, Free: 2.2 pg/mL (ref 2.0–4.4)

## 2023-10-30 NOTE — Addendum Note (Signed)
 Encounter addended by: Verner Kopischke R, MD on: 10/30/2023 6:10 PM  Actions taken: Clinical Note Signed, Level of Service modified, Visit diagnoses modified

## 2023-11-02 NOTE — Telephone Encounter (Signed)
 Called patient about his most recent lab results. After his doctor reviewed his labs, he said Kidney function is stable. Creatinine 2.15, GFR of 30. Consistent with chronic kidney disease stage IV. Continue losartan . No change are needed at this time. Patient expressed understanding of labs given to him.

## 2023-11-17 ENCOUNTER — Other Ambulatory Visit: Payer: Self-pay

## 2023-11-17 ENCOUNTER — Emergency Department (HOSPITAL_COMMUNITY)
Admission: EM | Admit: 2023-11-17 | Discharge: 2023-11-17 | Disposition: A | Discharge status: Home / Self Care | Attending: Emergency Medicine | Admitting: Emergency Medicine

## 2023-11-17 ENCOUNTER — Emergency Department (HOSPITAL_COMMUNITY)

## 2023-11-17 ENCOUNTER — Encounter (HOSPITAL_COMMUNITY): Payer: Self-pay | Admitting: Emergency Medicine

## 2023-11-17 DIAGNOSIS — I10 Essential (primary) hypertension: Secondary | ICD-10-CM | POA: Diagnosis not present

## 2023-11-17 DIAGNOSIS — E876 Hypokalemia: Secondary | ICD-10-CM | POA: Diagnosis not present

## 2023-11-17 DIAGNOSIS — E119 Type 2 diabetes mellitus without complications: Secondary | ICD-10-CM | POA: Diagnosis not present

## 2023-11-17 DIAGNOSIS — R1031 Right lower quadrant pain: Secondary | ICD-10-CM | POA: Diagnosis present

## 2023-11-17 DIAGNOSIS — Z8546 Personal history of malignant neoplasm of prostate: Secondary | ICD-10-CM | POA: Insufficient documentation

## 2023-11-17 DIAGNOSIS — R110 Nausea: Secondary | ICD-10-CM | POA: Diagnosis not present

## 2023-11-17 DIAGNOSIS — R197 Diarrhea, unspecified: Secondary | ICD-10-CM | POA: Diagnosis not present

## 2023-11-17 DIAGNOSIS — R001 Bradycardia, unspecified: Secondary | ICD-10-CM | POA: Diagnosis not present

## 2023-11-17 LAB — CBC WITH DIFFERENTIAL/PLATELET
Abs Immature Granulocytes: 0.03 K/uL (ref 0.00–0.07)
Basophils Absolute: 0 K/uL (ref 0.0–0.1)
Basophils Relative: 1 %
Eosinophils Absolute: 0.1 K/uL (ref 0.0–0.5)
Eosinophils Relative: 1 %
HCT: 42.5 % (ref 39.0–52.0)
Hemoglobin: 14.1 g/dL (ref 13.0–17.0)
Immature Granulocytes: 0 %
Lymphocytes Relative: 18 %
Lymphs Abs: 1.4 K/uL (ref 0.7–4.0)
MCH: 31 pg (ref 26.0–34.0)
MCHC: 33.2 g/dL (ref 30.0–36.0)
MCV: 93.4 fL (ref 80.0–100.0)
Monocytes Absolute: 0.8 K/uL (ref 0.1–1.0)
Monocytes Relative: 10 %
Neutro Abs: 5.5 K/uL (ref 1.7–7.7)
Neutrophils Relative %: 70 %
Platelets: 219 K/uL (ref 150–400)
RBC: 4.55 MIL/uL (ref 4.22–5.81)
RDW: 14.8 % (ref 11.5–15.5)
WBC: 7.9 K/uL (ref 4.0–10.5)
nRBC: 0 % (ref 0.0–0.2)

## 2023-11-17 LAB — URINALYSIS, ROUTINE W REFLEX MICROSCOPIC
Bacteria, UA: NONE SEEN
Bilirubin Urine: NEGATIVE
Glucose, UA: >=500 mg/dL — AB
Hgb urine dipstick: NEGATIVE
Ketones, ur: NEGATIVE mg/dL
Leukocytes,Ua: NEGATIVE
Nitrite: NEGATIVE
Protein, ur: NEGATIVE mg/dL
Specific Gravity, Urine: 1.005 (ref 1.005–1.030)
pH: 6 (ref 5.0–8.0)

## 2023-11-17 LAB — COMPREHENSIVE METABOLIC PANEL WITH GFR
ALT: 29 U/L (ref 0–44)
AST: 32 U/L (ref 15–41)
Albumin: 4.2 g/dL (ref 3.5–5.0)
Alkaline Phosphatase: 117 U/L (ref 38–126)
Anion gap: 12 (ref 5–15)
BUN: 38 mg/dL — ABNORMAL HIGH (ref 8–23)
CO2: 28 mmol/L (ref 22–32)
Calcium: 9.2 mg/dL (ref 8.9–10.3)
Chloride: 99 mmol/L (ref 98–111)
Creatinine, Ser: 2.08 mg/dL — ABNORMAL HIGH (ref 0.61–1.24)
GFR, Estimated: 31 mL/min — ABNORMAL LOW (ref >60)
Glucose, Bld: 150 mg/dL — ABNORMAL HIGH (ref 70–99)
Potassium: 3.2 mmol/L — ABNORMAL LOW (ref 3.5–5.1)
Sodium: 139 mmol/L (ref 135–145)
Total Bilirubin: 0.5 mg/dL (ref 0.0–1.2)
Total Protein: 7.7 g/dL (ref 6.5–8.1)

## 2023-11-17 LAB — GASTROINTESTINAL PANEL BY PCR, STOOL (REPLACES STOOL CULTURE)

## 2023-11-17 LAB — C DIFFICILE QUICK SCREEN W PCR REFLEX
C Diff antigen: NEGATIVE
C Diff interpretation: NOT DETECTED
C Diff toxin: NEGATIVE

## 2023-11-17 LAB — TROPONIN T, HIGH SENSITIVITY
Troponin T High Sensitivity: 17 ng/L (ref 0–19)
Troponin T High Sensitivity: 16 ng/L (ref 0–19)

## 2023-11-17 LAB — LIPASE, BLOOD: Lipase: 176 U/L — ABNORMAL HIGH (ref 11–51)

## 2023-11-17 MED ORDER — SODIUM CHLORIDE 0.9 % IV BOLUS
1000.0000 mL | Freq: Once | INTRAVENOUS | Status: AC
Start: 2023-11-17 — End: 2023-11-17
  Administered 2023-11-17: 1000 mL via INTRAVENOUS

## 2023-11-17 MED ORDER — ONDANSETRON HCL 4 MG/2ML IJ SOLN
4.0000 mg | Freq: Once | INTRAMUSCULAR | Status: AC
  Administered 2023-11-17: 4 mg via INTRAVENOUS
  Filled 2023-11-17: qty 2

## 2023-11-17 MED ORDER — IOHEXOL 300 MG/ML  SOLN
100.0000 mL | Freq: Once | INTRAMUSCULAR | Status: AC | PRN
Start: 2023-11-17 — End: 2023-11-17
  Administered 2023-11-17: 80 mL via INTRAVENOUS

## 2023-11-17 MED ORDER — POTASSIUM CHLORIDE CRYS ER 20 MEQ PO TBCR
40.0000 meq | EXTENDED_RELEASE_TABLET | Freq: Once | ORAL | Status: AC
  Administered 2023-11-17: 40 meq via ORAL
  Filled 2023-11-17: qty 2

## 2023-11-17 NOTE — ED Notes (Signed)
 Patient transported to CT

## 2023-11-17 NOTE — ED Provider Notes (Signed)
 [place] EMERGENCY DEPARTMENT AT [facility] Provider Note  CSN: [id number] Arrival date & time: 11/17/23 1026  Chief Complaint(s) Diarrhea  HPI Tommy Graham is a 83 y.o. male with past medical history as below, significant for hypertension, STEMI, DM2, prostate cancer who presents to the ED with complaint of abdominal pain nausea diarrhea  Onset approximately 5 days ago, lower abdominal cramping, primarily right lower quadrant.  Ongoing diarrhea, stool is not entirely liquid, some mushy stool present.  No blood or melena noted.  No recent travel or antibiotics.  No history of C. difficile reported.  No sick contacts.  He is having some nausea but no vomiting.  No fevers or chills, no rashes or yellowing of the skin.  Patient reports when he had a heart attack years ago his initial symptom was lower abdominal pain   Past Medical History Past Medical History:  Diagnosis Date   Cancer [facility])    prostate   Diabetes mellitus without complication (HCC)    Hypertension    Patient Active Problem List   Diagnosis Date Noted   Basal cell carcinoma (BCC) of skin of nose 11/04/2021   Gastroesophageal reflux disease 10/29/2021   Dysphagia 10/29/2021   SOB (shortness of breath) 10/05/2021   STEMI involving right coronary artery (HCC) 09/17/2021   Lumbar radiculopathy 07/16/2021   Low back pain 07/01/2021   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/02/2020   Cervical spine pain 07/09/2019   DDD (degenerative disc disease), cervical 07/09/2019   Pain in joint of left shoulder 06/28/2018   Acquired trigger finger of left middle finger 06/28/2018   Pain in finger of left hand 06/28/2018   Erectile dysfunction after radical prostatectomy 06/03/2017   Malignant neoplasm of prostate (HCC) 04/21/2015   DIABETES MELLITUS, TYPE II 09/03/2006   Essential hypertension 09/03/2006   Home Medication(s) Prior to Admission medications   Medication Sig Start Date End Date Taking? Authorizing Provider   amiodarone  (PACERONE ) 200 MG tablet Take 1 tablet (200 mg total) by mouth daily. 10/07/23   Bensimhon, Toribio SAUNDERS, MD  apixaban  (ELIQUIS ) 2.5 MG TABS tablet Take 1 tablet (2.5 mg total) by mouth 2 (two) times daily. 09/07/22   Bensimhon, Toribio SAUNDERS, MD  dapagliflozin  propanediol (FARXIGA ) 10 MG TABS tablet Take 10 mg by mouth daily.    [provider]  dorzolamide  (TRUSOPT ) 2 % ophthalmic solution Place 1 drop into both eyes in the morning. 12/23/20   [provider]  ferrous sulfate 325 (65 FE) MG EC tablet Take 325 mg by mouth daily with breakfast.    [provider]  FLUoxetine (PROZAC) 10 MG capsule Take 10 mg by mouth daily.    [provider]  insulin  glargine (LANTUS) 100 UNIT/ML injection Inject 12 Units into the skin in the morning. 8 units in the pm    [provider]  latanoprost  (XALATAN ) 0.005 % ophthalmic solution Place 1 drop into both eyes at bedtime. 12/23/20   [provider]  Olodaterol HCl (STRIVERDI RESPIMAT) 2.5 MCG/ACT AERS Inhale 2 puffs into the lungs 2 (two) times daily. Patient not taking: Reported on 10/07/2023    [provider]  ondansetron  (ZOFRAN ) 8 MG tablet Take 8 mg by mouth as needed for nausea or vomiting.    [provider]  pantoprazole  (PROTONIX ) 40 MG tablet Take 1 tablet (40 mg total) by mouth 2 (two) times daily. 10/29/21   Carlan, Chelsea L, NP  potassium chloride  SA (KLOR-CON  M) 20 MEQ tablet TAKE ONE (1)  TABLET BY MOUTH EVERY DAY 02/17/23   Bensimhon, Toribio SAUNDERS, MD  propranolol  (INDERAL ) 10 MG tablet Take 20 mg by mouth 2 (two) times daily.    [provider]  Semaglutide,0.25 or 0.5MG /DOS, (OZEMPIC, 0.25 OR 0.5 MG/DOSE,) 2 MG/1.5ML SOPN Inject 0.25 mg into the skin once a week.    [provider]  torsemide  (DEMADEX ) 20 MG tablet TAKE TWO (2) TABLETS BY MOUTH DAILY 01/17/23   Bensimhon, Toribio SAUNDERS, MD                                                                                                                                     Past Surgical History Past Surgical History:  Procedure Laterality Date   CERVICAL SPINE SURGERY     CORONARY STENT INTERVENTION N/A 09/17/2021   Procedure: CORONARY STENT INTERVENTION;  Surgeon: Wonda Sharper, MD;  Location: [facility] INVASIVE CV LAB;  Service: Cardiovascular;  Laterality: N/A;   LEFT HEART CATH AND CORONARY ANGIOGRAPHY N/A 09/17/2021   Procedure: LEFT HEART CATH AND CORONARY ANGIOGRAPHY;  Surgeon: Wonda Sharper, MD;  Location: [facility] INVASIVE CV LAB;  Service: Cardiovascular;  Laterality: N/A;   PROSTATE SURGERY     RIGHT HEART CATH N/A 09/17/2021   Procedure: RIGHT HEART CATH;  Surgeon: Wonda Sharper, MD;  Location: [facility] INVASIVE CV LAB;  Service: Cardiovascular;  Laterality: N/A;   ROTATOR CUFF REPAIR Bilateral    Family History History reviewed. No pertinent family history.  Social History Social History   Tobacco Use   Smoking status: Never   Smokeless tobacco: Never  Vaping Use   Vaping status: Never Used  Substance Use Topics   Alcohol  use: Never   Drug use: Never   Allergies Diphenhydramine hcl and Nifedipine  Review of Systems A thorough review of systems was obtained and all systems are negative except as noted in the HPI and PMH.   Physical Exam Vital Signs  I have reviewed the triage vital signs BP (!) 131/54   Pulse (!) 44   Temp 97.6 F (36.4 C) (Oral)   Resp 13   Ht 5' 9 (1.753 m)   Wt 68 kg   SpO2 95%   BMI 22.14 kg/m  Physical Exam Vitals and nursing note reviewed.  Constitutional:      General: He is not in acute distress.    Appearance: He is well-developed.  HENT:     Head: Normocephalic and atraumatic.     Right Ear: External ear normal.     Left Ear: External ear normal.     Mouth/Throat:     Mouth: Mucous membranes are moist.  Eyes:     General: No scleral icterus. Cardiovascular:     Rate and Rhythm: Bradycardia present.     Pulses: Normal pulses.  Pulmonary:      Effort: Pulmonary effort is normal. No respiratory distress.     Breath sounds: Normal breath sounds.  Abdominal:  General: Abdomen is flat.     Palpations: Abdomen is soft.     Tenderness: There is abdominal tenderness.   Musculoskeletal:     Cervical back: No rigidity.     Right lower leg: No edema.     Left lower leg: No edema.  Skin:    General: Skin is warm and dry.     Capillary Refill: Capillary refill takes less than 2 seconds.  Neurological:     Mental Status: He is alert.  Psychiatric:        Mood and Affect: Mood normal.        Behavior: Behavior normal.     ED Results and Treatments Labs (all labs ordered are listed, but only abnormal results are displayed) Labs Reviewed  COMPREHENSIVE METABOLIC PANEL WITH GFR - Abnormal; Notable for the following components:      Result Value   Potassium 3.2 (*)    Glucose, Bld 150 (*)    BUN 38 (*)    Creatinine, Ser 2.08 (*)    GFR, Estimated 31 (*)    All other components within normal limits  LIPASE, BLOOD - Abnormal; Notable for the following components:   Lipase 176 (*)    All other components within normal limits  URINALYSIS, ROUTINE W REFLEX MICROSCOPIC - Abnormal; Notable for the following components:   Color, Urine STRAW (*)    Glucose, UA >=500 (*)    All other components within normal limits  C DIFFICILE QUICK SCREEN W PCR REFLEX    GASTROINTESTINAL PANEL BY PCR, STOOL (REPLACES STOOL CULTURE)  CBC WITH DIFFERENTIAL/PLATELET  TROPONIN T, HIGH SENSITIVITY  TROPONIN T, HIGH SENSITIVITY                                                                                                                          Radiology No results found.  Pertinent labs & imaging results that were available during my care of the patient were reviewed by me and considered in my medical decision making (see MDM for details).  Medications Ordered in ED Medications  sodium chloride  0.9 % bolus 1,000 mL (0 mLs Intravenous Stopped  11/17/23 1325)  ondansetron  (ZOFRAN ) injection 4 mg (4 mg Intravenous Given 11/17/23 1157)  iohexol  (OMNIPAQUE ) 300 MG/ML solution 100 mL (80 mLs Intravenous Contrast Given 11/17/23 1246)  potassium chloride  SA (KLOR-CON  M) CR tablet 40 mEq (40 mEq Oral Given 11/17/23 1616)  Procedures Procedures  (including critical care time)  Medical Decision Making / ED Course    Medical Decision Making:    ANKIT DEGREGORIO is a 83 y.o. male with past medical history as below, significant for hypertension, STEMI, DM2, prostate cancer who presents to the ED with complaint of abdominal pain nausea diarrhea. The complaint involves an extensive differential diagnosis and also carries with it a high risk of complications and morbidity.  Serious etiology was considered. Ddx includes but is not limited to: Differential diagnosis includes but is not exclusive to acute appendicitis, renal colic, testicular torsion, urinary tract infection, prostatitis,  diverticulitis, small bowel obstruction, colitis, abdominal aortic aneurysm, gastroenteritis, constipation etc.   Complete initial physical exam performed, notably the patient was in no acute distress.    Reviewed and confirmed nursing documentation for past medical history, family history, social history.  Vital signs reviewed.    Lower abdominal pain Diarrhea, nausea> - Symptoms ongoing approximately 5 days, ongoing diarrhea...  Right lower quadrant TTP, nonperitoneal.  No fever - C. difficile negative, gastrointestinal PCR is pending. - He has elevated lipase but no epigastric pain or nausea or vomiting.  No evidence of pancreatitis on imaging.  Unclear etiology - Creatinine is similar to his baseline.  He was given fluids here - Patient reports feeling much better, abdomen is benign.  Tolerant p.o. no difficulty.  Eager for  discharge so we can go and eat  - Abdominal pain precautions, supportive care encouraged, include rehydration, close follow-up PCP regarding gastrointestinal PCR panel and for recheck   Clinical Course as of 11/21/23 0744  Thu Nov 17, 2023  1228 Creatinine(!): 2.08 Similar to prev, he does appear dry on exam. Provide IVF [SG]  1429 Patient reports he is feeling much better and is ready to be discharged [SG]    Clinical Course User Index [SG] Elnor Jayson LABOR, DO     I have discussed the diagnosis/risks/treatment options with the patient and family.  Evaluation and diagnostic testing in the emergency department does not suggest an emergent condition requiring admission or immediate intervention beyond what has been performed at this time.  They will follow up with pcp. We also discussed returning to the ED immediately if new or worsening sx occur. We discussed the sx which are most concerning (e.g., sudden worsening pain, fever, inability to tolerate by mouth , diarrhea that does not stop, hematochezia) that necessitate immediate return.    The patient appears reasonably screened and/or stabilized for discharge and I doubt any other medical condition or other [facility] requiring further screening, evaluation, or treatment in the ED at this time prior to discharge.                  Additional history obtained: -Additional history obtained from family -External records from outside source obtained and reviewed including: Chart review including previous notes, labs, imaging, consultation notes including  Home medications   Lab Tests: -I ordered, reviewed, and interpreted labs.   The pertinent results include:   Labs Reviewed  COMPREHENSIVE METABOLIC PANEL WITH GFR - Abnormal; Notable for the following components:      Result Value   Potassium 3.2 (*)    Glucose, Bld 150 (*)    BUN 38 (*)    Creatinine, Ser 2.08 (*)    GFR, Estimated 31 (*)    All other components within normal  limits  LIPASE, BLOOD - Abnormal; Notable for the following components:   Lipase 176 (*)  All other components within normal limits  URINALYSIS, ROUTINE W REFLEX MICROSCOPIC - Abnormal; Notable for the following components:   Color, Urine STRAW (*)    Glucose, UA >=500 (*)    All other components within normal limits  C DIFFICILE QUICK SCREEN W PCR REFLEX    GASTROINTESTINAL PANEL BY PCR, STOOL (REPLACES STOOL CULTURE)  CBC WITH DIFFERENTIAL/PLATELET  TROPONIN T, HIGH SENSITIVITY  TROPONIN T, HIGH SENSITIVITY    Notable for as above  EKG   EKG Interpretation Date/Time:  Thursday November 17 2023 10:48:27 EDT Ventricular Rate:  49 PR Interval:  174 QRS Duration:  122 QT Interval:  596 QTC Calculation: 539 R Axis:   -85  Text Interpretation: Sinus bradycardia Nonspecific IVCD with LAD Confirmed by Elnor Savant ([id number]) on 11/17/2023 1:24:25 PM         Imaging Studies ordered: I ordered imaging studies including CT abdomen pelvis I independently visualized the following imaging with scope of interpretation limited to determining acute life threatening conditions related to emergency care; findings noted above I agree with the radiologist interpretation If any imaging was obtained with contrast I closely monitored patient for any possible adverse reaction a/w contrast administration in the emergency department   Medicines ordered and prescription drug management: Meds ordered this encounter  Medications   sodium chloride  0.9 % bolus 1,000 mL   ondansetron  (ZOFRAN ) injection 4 mg   iohexol  (OMNIPAQUE ) 300 MG/ML solution 100 mL   potassium chloride  SA (KLOR-CON  M) CR tablet 40 mEq    -I have reviewed the patients home medicines and have made adjustments as needed   Consultations Obtained: na   Cardiac Monitoring: Continuous pulse oximetry interpreted by myself, 97% on RA.    Social Determinants of Health:  Diagnosis or treatment significantly limited by social  determinants of health: na   Reevaluation: After the interventions noted above, I reevaluated the patient and found that they have improved  Co morbidities that complicate the patient evaluation  Past Medical History:  Diagnosis Date   Cancer (HCC)    prostate   Diabetes mellitus without complication (HCC)    Hypertension       Dispostion: Disposition decision including need for hospitalization was considered, and patient discharged from emergency department.    Final Clinical Impression(s) / ED Diagnoses Final diagnoses:  Diarrhea, unspecified type  Hypokalemia        Elnor Savant LABOR, DO 11/21/23 [phone]

## 2023-11-17 NOTE — ED Notes (Signed)
 ED Provider at bedside.

## 2023-11-17 NOTE — ED Notes (Signed)
 Pt ambulated to restroom, but was unablt to provide a stool specimen at this time.

## 2023-11-17 NOTE — Discharge Instructions (Addendum)
 It was a pleasure caring for you today in the emergency department.  Your workup today was reassuring, be sure to drink plenty of fluids over the next few days.  You can take Imodium or Pepto-Bismol to help with your diarrhea.   C. difficile testing was negative.  You have another stool study that is pending that should come back in the next 12 to 24 hours to look for any source of unusual bacteria in your stool.  In the meantime please plan to follow-up with your primary care doctor in the next 5 to 7 days.  If you start having blood in your stool or worsening diarrhea, vomiting or fevers, or any worsening or worrisome symptoms please return to the ER for evaluation.  Please return to the emergency department for any worsening or worrisome symptoms.

## 2023-11-17 NOTE — ED Triage Notes (Signed)
 Pt in by POV c/o of diarrhea since Saturday and lower abd pain.

## 2023-11-18 ENCOUNTER — Other Ambulatory Visit (INDEPENDENT_AMBULATORY_CARE_PROVIDER_SITE_OTHER): Payer: Self-pay | Admitting: Gastroenterology
